# Patient Record
Sex: Female | Born: 1935 | Race: Black or African American | Hispanic: No | Marital: Married | Smoking: Former smoker
Health system: Southern US, Community
[De-identification: ages and names within clinical notes are randomized; demographics above are authoritative.]

## PROBLEM LIST (undated history)

## (undated) ENCOUNTER — Emergency Department (HOSPITAL_COMMUNITY): Disposition: A | Discharge status: Other Acute Inpatient Hospital | Payer: Medicare Other

## (undated) DIAGNOSIS — Z950 Presence of cardiac pacemaker: Secondary | ICD-10-CM

## (undated) DIAGNOSIS — I429 Cardiomyopathy, unspecified: Secondary | ICD-10-CM

## (undated) DIAGNOSIS — I10 Essential (primary) hypertension: Secondary | ICD-10-CM

## (undated) DIAGNOSIS — D509 Iron deficiency anemia, unspecified: Secondary | ICD-10-CM

## (undated) DIAGNOSIS — K469 Unspecified abdominal hernia without obstruction or gangrene: Secondary | ICD-10-CM

## (undated) DIAGNOSIS — F32A Depression, unspecified: Secondary | ICD-10-CM

## (undated) DIAGNOSIS — M1990 Unspecified osteoarthritis, unspecified site: Secondary | ICD-10-CM

## (undated) DIAGNOSIS — F329 Major depressive disorder, single episode, unspecified: Secondary | ICD-10-CM

## (undated) DIAGNOSIS — E049 Nontoxic goiter, unspecified: Secondary | ICD-10-CM

## (undated) DIAGNOSIS — I442 Atrioventricular block, complete: Secondary | ICD-10-CM

## (undated) DIAGNOSIS — J449 Chronic obstructive pulmonary disease, unspecified: Secondary | ICD-10-CM

## (undated) DIAGNOSIS — Z90710 Acquired absence of both cervix and uterus: Secondary | ICD-10-CM

## (undated) DIAGNOSIS — Z8719 Personal history of other diseases of the digestive system: Secondary | ICD-10-CM

## (undated) DIAGNOSIS — I4891 Unspecified atrial fibrillation: Secondary | ICD-10-CM

## (undated) DIAGNOSIS — K5790 Diverticulosis of intestine, part unspecified, without perforation or abscess without bleeding: Secondary | ICD-10-CM

## (undated) DIAGNOSIS — I5020 Unspecified systolic (congestive) heart failure: Secondary | ICD-10-CM

## (undated) DIAGNOSIS — I447 Left bundle-branch block, unspecified: Secondary | ICD-10-CM

## (undated) DIAGNOSIS — K31819 Angiodysplasia of stomach and duodenum without bleeding: Secondary | ICD-10-CM

## (undated) DIAGNOSIS — K219 Gastro-esophageal reflux disease without esophagitis: Secondary | ICD-10-CM

## (undated) DIAGNOSIS — I2510 Atherosclerotic heart disease of native coronary artery without angina pectoris: Secondary | ICD-10-CM

## (undated) HISTORY — DX: Gastro-esophageal reflux disease without esophagitis: K21.9

## (undated) HISTORY — DX: Depression, unspecified: F32.A

## (undated) HISTORY — DX: Major depressive disorder, single episode, unspecified: F32.9

## (undated) HISTORY — PX: CARDIAC CATHETERIZATION: SHX172

## (undated) HISTORY — PX: SMALL INTESTINE SURGERY: SHX150

## (undated) HISTORY — PX: PACEMAKER PLACEMENT: SHX43

## (undated) HISTORY — PX: ABDOMINAL HYSTERECTOMY: SHX81

## (undated) HISTORY — DX: Acquired absence of both cervix and uterus: Z90.710

## (undated) HISTORY — DX: Unspecified abdominal hernia without obstruction or gangrene: K46.9

## (undated) HISTORY — DX: Unspecified osteoarthritis, unspecified site: M19.90

## (undated) HISTORY — PX: INSERT / REPLACE / REMOVE PACEMAKER: SUR710

## (undated) HISTORY — PX: CHOLECYSTECTOMY: SHX55

## (undated) HISTORY — DX: Cardiomyopathy, unspecified: I42.9

## (undated) HISTORY — DX: Essential (primary) hypertension: I10

## (undated) HISTORY — DX: Unspecified atrial fibrillation: I48.91

## (undated) HISTORY — DX: Chronic obstructive pulmonary disease, unspecified: J44.9

## (undated) HISTORY — DX: Atherosclerotic heart disease of native coronary artery without angina pectoris: I25.10

## (undated) HISTORY — DX: Nontoxic goiter, unspecified: E04.9

## (undated) HISTORY — DX: Diverticulosis of intestine, part unspecified, without perforation or abscess without bleeding: K57.90

## (undated) HISTORY — PX: TUBAL LIGATION: SHX77

---

## 2005-03-03 ENCOUNTER — Inpatient Hospital Stay (HOSPITAL_COMMUNITY): Admission: RE | Admit: 2005-03-03 | Discharge: 2005-03-06 | Payer: Self-pay | Admitting: Orthopedic Surgery

## 2005-03-06 ENCOUNTER — Ambulatory Visit: Payer: Self-pay | Admitting: Physical Medicine & Rehabilitation

## 2005-03-06 ENCOUNTER — Inpatient Hospital Stay
Admission: RE | Admit: 2005-03-06 | Discharge: 2005-03-16 | Payer: Self-pay | Admitting: Physical Medicine & Rehabilitation

## 2006-06-02 ENCOUNTER — Encounter: Admission: RE | Admit: 2006-06-02 | Discharge: 2006-06-02 | Payer: Self-pay | Admitting: Orthopedic Surgery

## 2006-06-30 ENCOUNTER — Inpatient Hospital Stay (HOSPITAL_COMMUNITY): Admission: RE | Admit: 2006-06-30 | Discharge: 2006-07-03 | Payer: Self-pay | Admitting: Orthopedic Surgery

## 2009-10-29 ENCOUNTER — Encounter: Payer: Self-pay | Admitting: Internal Medicine

## 2009-10-29 ENCOUNTER — Ambulatory Visit: Payer: Self-pay | Admitting: Cardiology

## 2009-11-11 ENCOUNTER — Encounter: Payer: Self-pay | Admitting: Internal Medicine

## 2010-02-12 ENCOUNTER — Other Ambulatory Visit: Payer: Self-pay | Admitting: Internal Medicine

## 2010-02-12 ENCOUNTER — Encounter: Payer: Self-pay | Admitting: Internal Medicine

## 2010-02-12 ENCOUNTER — Ambulatory Visit
Admission: RE | Admit: 2010-02-12 | Discharge: 2010-02-12 | Payer: Self-pay | Source: Home / Self Care | Attending: Internal Medicine | Admitting: Internal Medicine

## 2010-02-12 DIAGNOSIS — I495 Sick sinus syndrome: Secondary | ICD-10-CM | POA: Insufficient documentation

## 2010-02-12 DIAGNOSIS — Z950 Presence of cardiac pacemaker: Secondary | ICD-10-CM | POA: Insufficient documentation

## 2010-02-12 DIAGNOSIS — I10 Essential (primary) hypertension: Secondary | ICD-10-CM | POA: Insufficient documentation

## 2010-02-12 LAB — CBC WITH DIFFERENTIAL/PLATELET
Basophils Absolute: 0 10*3/uL (ref 0.0–0.1)
Basophils Relative: 0.5 % (ref 0.0–3.0)
Eosinophils Absolute: 0.2 10*3/uL (ref 0.0–0.7)
Eosinophils Relative: 3.2 % (ref 0.0–5.0)
HCT: 38.8 % (ref 36.0–46.0)
Hemoglobin: 13.3 g/dL (ref 12.0–15.0)
Lymphocytes Relative: 25.9 % (ref 12.0–46.0)
Lymphs Abs: 1.8 10*3/uL (ref 0.7–4.0)
MCHC: 34.2 g/dL (ref 30.0–36.0)
MCV: 84.8 fl (ref 78.0–100.0)
Monocytes Absolute: 0.4 10*3/uL (ref 0.1–1.0)
Monocytes Relative: 5.4 % (ref 3.0–12.0)
Neutro Abs: 4.6 10*3/uL (ref 1.4–7.7)
Neutrophils Relative %: 65 % (ref 43.0–77.0)
Platelets: 261 10*3/uL (ref 150.0–400.0)
RBC: 4.57 Mil/uL (ref 3.87–5.11)
RDW: 13.8 % (ref 11.5–14.6)
WBC: 7.1 10*3/uL (ref 4.5–10.5)

## 2010-02-12 LAB — PROTIME-INR
INR: 1.4 ratio — ABNORMAL HIGH (ref 0.8–1.0)
Prothrombin Time: 14.6 s — ABNORMAL HIGH (ref 9.7–11.8)

## 2010-02-12 LAB — BASIC METABOLIC PANEL
BUN: 18 mg/dL (ref 6–23)
CO2: 28 mEq/L (ref 19–32)
Calcium: 9.4 mg/dL (ref 8.4–10.5)
Chloride: 102 mEq/L (ref 96–112)
Creatinine, Ser: 0.8 mg/dL (ref 0.4–1.2)
GFR: 88.45 mL/min (ref >60.00)
Glucose, Bld: 88 mg/dL (ref 70–99)
Potassium: 4.2 mEq/L (ref 3.5–5.1)
Sodium: 138 mEq/L (ref 135–145)

## 2010-02-12 LAB — APTT: aPTT: 27.2 s (ref 21.7–28.8)

## 2010-02-17 ENCOUNTER — Ambulatory Visit (HOSPITAL_COMMUNITY)
Admission: RE | Admit: 2010-02-17 | Discharge: 2010-02-17 | Payer: Self-pay | Source: Home / Self Care | Attending: Internal Medicine | Admitting: Internal Medicine

## 2010-02-18 ENCOUNTER — Encounter: Payer: Self-pay | Admitting: Internal Medicine

## 2010-02-24 LAB — SURGICAL PCR SCREEN
MRSA, PCR: NEGATIVE
Staphylococcus aureus: NEGATIVE

## 2010-03-06 ENCOUNTER — Ambulatory Visit: Admission: RE | Admit: 2010-03-06 | Discharge: 2010-03-06 | Payer: Self-pay | Source: Home / Self Care

## 2010-03-06 ENCOUNTER — Encounter: Payer: Self-pay | Admitting: Internal Medicine

## 2010-03-10 ENCOUNTER — Telehealth (INDEPENDENT_AMBULATORY_CARE_PROVIDER_SITE_OTHER): Payer: Self-pay | Admitting: *Deleted

## 2010-03-11 NOTE — Miscellaneous (Signed)
Summary: Device preload  Clinical Lists Changes  Observations: Added new observation of PPM INDICATN: Sick sinus syndrome (11/11/2009 15:30) Added new observation of MAGNET RTE: BOL 98.6 ERI 86.3 (11/11/2009 15:30) Added new observation of PPMLEADSTAT2: active (11/11/2009 15:30) Added new observation of PPMLEADSER2: [id number] (11/11/2009 15:30) Added new observation of PPMLEADMOD2: 1488TC (11/11/2009 15:30) Added new observation of PPMLEADDOI2: 11/01/2000 (11/11/2009 15:30) Added new observation of PPMLEADLOC2: RV (11/11/2009 15:30) Added new observation of PPMLEADSTAT1: active (11/11/2009 15:30) Added new observation of PPMLEADSER1: [id number] (11/11/2009 15:30) Added new observation of PPMLEADMOD1: 1488TC (11/11/2009 15:30) Added new observation of PPMLEADDOI1: 11/01/2000 (11/11/2009 15:30) Added new observation of PPMLEADLOC1: RA (11/11/2009 15:30) Added new observation of PPM IMP MD: NOT IMPLANTED BY [place] (11/11/2009 15:30) Added new observation of PPM DOI: 11/01/2000 (11/11/2009 15:30) Added new observation of PPM SERL#: [id number]  (11/11/2009 15:30) Added new observation of PPM MODL#: [id number]  (11/11/2009 15:30) Added new observation of PACEMAKERMFG: St Jude  (11/11/2009 15:30) Added new observation of PPM REFER MD: Duffy Rhody Tennant,MD  (11/11/2009 15:30) Added new observation of PACEMAKER MD: Hillis Range, MD  (11/11/2009 15:30)      PPM Specifications Following MD:  Hillis Range, MD     Referring MD:  Rolla Plate PPM Vendor:  St Jude     PPM Model Number:  [id number]     PPM Serial Number:  [id number] PPM DOI:  11/01/2000     PPM Implanting MD:  NOT IMPLANTED BY [place]  Lead 1    Location: RA     DOI: 11/01/2000     Model #: 1488TC     Serial #: [id number]     Status: active Lead 2    Location: RV     DOI: 11/01/2000     Model #: 1488TC     Serial #: [id number]     Status: active  Magnet Response Rate:  BOL 98.6 ERI 86.3  Indications:  Sick sinus syndrome

## 2010-03-13 NOTE — Letter (Signed)
Summary: [facility] Cardiology Progress Note   [facility] Cardiology Progress Note   Imported By: Roderic Ovens 02/14/2010 12:28:34  _____________________________________________________________________  External Attachment:    Type:   Image     Comment:   External Document

## 2010-03-13 NOTE — Miscellaneous (Signed)
Summary: Device change out  Clinical Lists Changes  Observations: Added new observation of MAGNET RTE: BOL100 ERI 85 (02/18/2010 13:53) Added new observation of PPM IMP MD: Lewayne Bunting, MD (02/18/2010 13:53) Added new observation of PPM DOI: 02/17/2010 (02/18/2010 13:53) Added new observation of PPM SERL#: [id number]  (02/18/2010 13:53) Added new observation of PPM MODL#: [id number]  (02/18/2010 11:91) Added new observation of PPMEXPLCOMM: 02/17/10 St.Jude [id number] explanted  (02/18/2010 13:53)      PPM Specifications Following MD:  Hillis Range, MD     Referring MD:  Rolla Plate PPM Vendor:  [facility] Jude     PPM Model Number:  [id number]     PPM Serial Number:  [id number] PPM DOI:  02/17/2010     PPM Implanting MD:  Lewayne Bunting, MD  Lead 1    Location: RA     DOI: 11/01/2000     Model #: 1488TC     Serial #: [id number]     Status: active Lead 2    Location: RV     DOI: 11/01/2000     Model #: 1488TC     Serial #: [id number]     Status: active  Magnet Response Rate:  BOL100 ERI 85  Indications:  Sick sinus syndrome  Explantation Comments:  02/17/10 St.Jude [id number] explanted  PPM Follow Up Pacer Dependent:  Yes      Parameters Mode:  DDDR     Lower Rate Limit:  60     Upper Rate Limit:  120 Paced AV Delay:  150     Sensed AV Delay:  130

## 2010-03-13 NOTE — Procedures (Signed)
Summary: pacer check/st. jude   Allergies (verified): No Known Drug Allergies  History of Present Illness: [name] returns today for PPM follow up.  She has a h/o bradycardia and apparent CHB.  She underwent P[place] in [place] in 2002.  She has done well and denies c/p or sob.    Past History:  Past Medical History: Hypertension  Past Surgical History: s/p PPM   Physical Exam  General:  Well developed, well nourished, in no acute distress.  HEENT: normal Neck: supple. No JVD. Carotids 2+ bilaterally no bruits Cor: RRR no rubs, gallops or murmur Lungs: CTA. Well healed PPM incision. Ab: soft, nontender. nondistended. No HSM. Good bowel sounds Ext: warm. no cyanosis, clubbing or edema Neuro: alert and oriented. Grossly nonfocal. affect pleasant     PPM Specifications Following MD:  Hillis Range, MD     Referring MD:  Rolla Plate PPM Vendor:  [facility] Jude     PPM Model Number:  [id number]     PPM Serial Number:  [id number] PPM DOI:  11/01/2000     PPM Implanting MD:  NOT IMPLANTED BY [place]  Lead 1    Location: RA     DOI: 11/01/2000     Model #: 1488TC     Serial #: [id number]     Status: active Lead 2    Location: RV     DOI: 11/01/2000     Model #: 1488TC     Serial #: [id number]     Status: active  Magnet Response Rate:  BOL 98.6 ERI 86.3  Indications:  Sick sinus syndrome   PPM Follow Up Remote Check?  No Battery Est. Longevity:  7-12 months/tech support     Pacer Dependent:  Yes      Parameters Mode:  DDDR     Lower Rate Limit:  60     Upper Rate Limit:  120 Paced AV Delay:  150     Sensed AV Delay:  130 Tech Comments:  Unable to communicate with the device today. I was able to email [facility] @ [address]. Jude tech support with a download of the device.  EKG shows AV pacing with the magnet @ 93bpm.   Estimated longevity 7-12 months per tech support but still unable to get any telemetry.  After talking with Dr. Ladona Ridgel the decision was made to change out the device and will be  scheduled. Altha Harm, LPN  February 12, 2010 12:24 PM  MD Comments:  Agree with above.     Impression & Recommendations:  Problem # 1:  CARDIAC PACEMAKER IN SITU (ICD-V45.01) By paced rate, she should have 7 months of battery but her current device cannot be communicated with.  I have recommended proceeding with PPM. The risks/benefits/goals/expectations of PPM gen change have been discussed with the patient and she wishes to proceed.  Problem # 2:  ESSENTIAL HYPERTENSION, BENIGN (ICD-401.1) She will continue her current meds and maintain a low sodium diet. Her updated medication list for this problem includes:    Carvedilol 25 Mg Tabs (Carvedilol) .[name]... Take one tablet by mouth twice a day    Exforge Hct 10-320-25 Mg Tabs (Amlodipine-valsartan-hctz) ..... Once daily    Aspirin Ec 325 Mg Tbec (Aspirin) .[name]... Take one tablet by mouth daily  Other Orders: TLB-BMP (Basic Metabolic Panel-BMET) (80048-METABOL) TLB-CBC Platelet - w/Differential (85025-CBCD) TLB-PT (Protime) (85610-PTP) TLB-PTT (85730-PTTL)

## 2010-03-13 NOTE — Cardiovascular Report (Signed)
Summary: Office Visit   Office Visit   Imported By: Roderic Ovens 02/14/2010 12:27:47  _____________________________________________________________________  External Attachment:    Type:   Image     Comment:   External Document

## 2010-03-13 NOTE — Cardiovascular Report (Signed)
Summary: Pre-Op Orders  Pre-Op Orders   Imported By: Marylou Mccoy 02/19/2010 16:42:28  _____________________________________________________________________  External Attachment:    Type:   Image     Comment:   External Document

## 2010-03-13 NOTE — Procedures (Signed)
Summary: wch/lg   Current Medications (verified): 1)  Vitamin D-3 5000 Unit Tabs (Cholecalciferol) .... Uad 2)  Tramadol Hcl 50 Mg Tabs (Tramadol Hcl) .... Uad 3)  Carvedilol 25 Mg Tabs (Carvedilol) .... Take One Tablet By Mouth Twice A Day 4)  Exforge Hct 10-320-25 Mg Tabs (Amlodipine-Valsartan-Hctz) .... Once Daily 5)  Aspirin Ec 325 Mg Tbec (Aspirin) .... Take One Tablet By Mouth Daily 6)  Trazodone Hcl 50 Mg Tabs (Trazodone Hcl) .... Take 2 Tablets By Mouth Four Times Daily  Allergies (verified): No Known Drug Allergies  PPM Specifications Following MD:  Hillis Range, MD     Referring MD:  Rolla Plate PPM Vendor:  St Jude     PPM Model Number:  [id number]     PPM Serial Number:  [id number] PPM DOI:  02/17/2010     PPM Implanting MD:  Lewayne Bunting, MD  Lead 1    Location: RA     DOI: 11/01/2000     Model #: 1488TC     Serial #: [id number]     Status: active Lead 2    Location: RV     DOI: 11/01/2000     Model #: 1488TC     Serial #: [id number]     Status: active  Magnet Response Rate:  BOL100 ERI 85  Indications:  Sick sinus syndrome  Explantation Comments:  02/17/10 St.Jude [id number] explanted  PPM Follow Up Battery Voltage:  3.20 V     Battery Est. Longevity:  9.0 yrs     Pacer Dependent:  Yes       PPM Device Measurements Atrium  Amplitude: 1.7 mV, Impedance: 430 ohms, Threshold: 0.5 V at 0.5 msec Right Ventricle  Amplitude: 9.2 mV, Impedance: 480 ohms, Threshold: 1.5 V at 0.5 msec  Episodes MS Episodes:  19826     Percent Mode Switch:  65%     Ventricular High Rate:  0     Atrial Pacing:  33%     Ventricular Pacing:  >99%  Parameters Mode:  DDDR     Lower Rate Limit:  60     Upper Rate Limit:  120 Paced AV Delay:  150     Sensed AV Delay:  130 Next Cardiology Appt Due:  05/12/2010 Tech Comments:  WOUND CHECK---STERI STRIPS REMOVED.  NO REDNESS OR SWELLING AT SITE.  NORMAL DEVICE FUNCTION.  CHANGED RA SENSITIVITY FROM 0.2 TO 0.73mV AND RV SENSITIVITY FROM 4.0 TO 3.29mV.   CHANGED RA OUTPUT FROM 2.5 TO 2.0 V. ROV IN 3 MTHS W/GT. Vella Kohler  March 06, 2010 11:44 AM

## 2010-03-19 NOTE — Progress Notes (Signed)
Summary: QUESTION RE DEVICE  Phone Note Call from Patient Call back at Home Phone [phone]   Caller: Patient Reason for Call: Talk to Nurse Summary of Call: PT HAS QUESTION RE HER PACEMAKER.  Initial call taken by: Roe Coombs,  March 10, 2010 1:19 PM  Follow-up for Phone Call        Patient told to bring [facility] transmitter with her to next visit for instructions. Follow-up by: Altha Harm, LPN,  March 11, 2010 4:53 PM

## 2010-03-19 NOTE — Cardiovascular Report (Signed)
Summary: Office Visit   Office Visit   Imported By: Roderic Ovens 03/12/2010 15:25:24  _____________________________________________________________________  External Attachment:    Type:   Image     Comment:   External Document

## 2010-03-20 NOTE — Op Note (Signed)
  NAMEZANNA, HAWN                ACCOUNT NO.:  [id number]  MEDICAL RECORD NO.:  [id number]          PATIENT TYPE:  OIB  LOCATION:  2899                         FACILITY:  [facility]  PHYSICIAN:  Doylene Canning. Ladona Ridgel, MD    DATE OF BIRTH:  22-Nov-1932  DATE OF PROCEDURE:  02/17/2010 DATE OF DISCHARGE:                              OPERATIVE REPORT   PROCEDURE PERFORMED:  Removal of a previously implanted dual-chamber pacemaker which reached beyond ERI, insertion of a new dual-chamber pacemaker with pacemaker lead testing and pacemaker pocket revision.  INTRODUCTION:  The patient is a 77 year old woman with a history of symptomatic bradycardia and complete heart block.  She is status post permanent pacemaker insertion in 2003.  She is at end-of-life.  Her interrogator will not communicate with her current pacemaker and she is now referred for removal of a previously implanted device and insertion of a new.  PROCEDURE:  After informed consent was obtained, the patient was taken to the diagnostic EP lab in a fasting state.  After usual preparation and draping, intravenous fentanyl and midazolam was given for sedation, 30 mL of lidocaine was infiltrated into the left infraclavicular region. A 5-cm incision was carried out over this region.  Electrocautery was utilized to dissect down to the pacemaker pocket.  The pacemaker pocket was entered and the device was removed with gentle traction.  The leads were freed up with electrocautery.  The P-waves were run a millivolt with underlying rhythm in the AFib with the impedance in the AOO mode was 420 ohms.  Interrogation of the ventricular lead demonstrated an escape rate of about 30-35 beats per minute.  R-waves measured 9 mV, pacing impedance was 440 ohms and threshold was around a volt 0.25 at 0.5 milliseconds.  With these satisfactory parameters, the new St. Jude Accent RF DR dual-chamber pacemaker serial number [id number] was connected to the  atrial and the RV leads and placed back in the subcutaneous pocket.  The pocket was insufficient size for the new device and electrocautery was utilized to revise the pocket to accommodate the slightly larger device.  At this point the pocket was irrigated and the incision closed with 2-0 and 3-0 Vicryl.  Benzoin and Steri-Strips were painted on the skin.  A pressure dressing was applied and the patient was returned to her room in satisfactory condition.  COMPLICATIONS:  There were no immediate procedure complications.  RESULTS:  This demonstrated successful removal of a previously implanted dual-chamber pacemaker which was at EOL, followed by revision of a permanent pacemaker pocket, followed by insertion of a new dual-chamber St. Jude Accent pacemaker without immediate procedure complications.     Doylene Canning. Ladona Ridgel, MD     GWT/MEDQ  D:  02/17/2010  T:  02/18/2010  Job:  [id number]  Electronically Signed by Lewayne Bunting MD on 03/20/2010 05:09:52 PM

## 2010-05-30 ENCOUNTER — Encounter: Payer: Self-pay | Admitting: *Deleted

## 2010-05-30 ENCOUNTER — Encounter: Payer: Self-pay | Admitting: Internal Medicine

## 2010-06-02 ENCOUNTER — Ambulatory Visit (INDEPENDENT_AMBULATORY_CARE_PROVIDER_SITE_OTHER): Payer: Medicare Other | Admitting: Internal Medicine

## 2010-06-02 ENCOUNTER — Encounter: Payer: Self-pay | Admitting: Internal Medicine

## 2010-06-02 ENCOUNTER — Other Ambulatory Visit: Payer: Self-pay | Admitting: Internal Medicine

## 2010-06-02 DIAGNOSIS — I10 Essential (primary) hypertension: Secondary | ICD-10-CM

## 2010-06-02 DIAGNOSIS — Z950 Presence of cardiac pacemaker: Secondary | ICD-10-CM

## 2010-06-02 DIAGNOSIS — I495 Sick sinus syndrome: Secondary | ICD-10-CM

## 2010-06-02 NOTE — Assessment & Plan Note (Signed)
Her blood pressure appears to be well controlled. She will continue a low-sodium diet and her current medications.

## 2010-06-02 NOTE — Patient Instructions (Signed)
Your physician wants you to follow-up in: 9 months with Dr Taylor.  You will receive a reminder letter in the mail two months in advance. If you don't receive a letter, please call our office to schedule the follow-up appointment.  

## 2010-06-02 NOTE — Assessment & Plan Note (Signed)
Her device is working normally. Her wound appears to be well-healed. We'll see her back in several months.

## 2010-06-02 NOTE — Progress Notes (Signed)
HPI [name] returns today for followup. She has a history of hypertension and bradycardia and status post pacemaker insertion. She recently underwent pacemaker generator change. She has healed up nicely and has no specific complaints today. No syncope, peripheral edema, chest pain, or shortness of breath. No Known Allergies   Current Outpatient Prescriptions  Medication Sig Dispense Refill  . ergocalciferol (VITAMIN D2) 50000 UNITS capsule Take 50,000 Units by mouth once a week.        . lansoprazole (PREVACID) 15 MG capsule Take 15 mg by mouth daily.        [name] amLODipine-valsartan (EXFORGE) 10-320 MG per tablet Take 1 tablet by mouth daily.        [name] aspirin 325 MG tablet Take 325 mg by mouth daily.        . carvedilol (COREG) 25 MG tablet Take 25 mg by mouth 2 (two) times daily with a meal.        . traMADol (ULTRAM) 50 MG tablet Take 50 mg by mouth every 6 (six) hours as needed.           Past Medical History  Diagnosis Date  . HTN (hypertension)   . CAD (coronary artery disease)   . Hernia     Incisional hernia, umbilicus.    ROS:   All systems reviewed and negative except as noted in the HPI.   Past Surgical History  Procedure Date  . Pacemaker placement , 2002.  [name] Cholecystectomy      Family History  Problem Relation Age of Onset  . Coronary artery disease       History   Social History  . Marital Status: Married    Spouse Name: N/A    Number of Children: N/A  . Years of Education: N/A   Occupational History  . Not on file.   Social History Main Topics  . Smoking status: Current Everyday Smoker    Types: Cigarettes  . Smokeless tobacco: Not on file  . Alcohol Use: Not on file  . Drug Use: Not on file  . Sexually Active: Not on file   Other Topics Concern  . Not on file   Social History Narrative    Married, lives with her husband in a one-level home, three steps to enter, moved from [place] three years ago due to her husband's retirement and  his local roots being from [place].     BP 110/70  Pulse 68  Ht 5\' 9"  (1.753 m)  Wt 240 lb (108.863 kg)  BMI 35.44 kg/m2  Physical Exam:  Well appearing NAD HEENT: Unremarkable Neck:  No JVD, no thyromegally Lymphatics:  No adenopathy Back:  No CVA tenderness Lungs:  Clear.well-healed pacemaker incision. HEART:  Regular rate rhythm, no murmurs, no rubs, no clicks Abd:  Flat, positive bowel sounds, no organomegally, no rebound, no guarding Ext:  2 plus pulses, no edema, no cyanosis, no clubbing Skin:  No rashes no nodules Neuro:  CN II through XII intact, motor grossly intact  DEVICE  Normal device function.  See PaceArt for details.   Assess/Plan:

## 2010-06-24 NOTE — Op Note (Signed)
[name]                ACCOUNT NO.:  [id number]   MEDICAL RECORD NO.:  [id number]          PATIENT TYPE:  INP   LOCATION:  5018                         FACILITY:  [facility]   PHYSICIAN:  Burnard Bunting, M.D.    DATE OF BIRTH:  11-21-1932   DATE OF PROCEDURE:  06/30/2006  DATE OF DISCHARGE:                               OPERATIVE REPORT   PREOPERATIVE DIAGNOSIS:  Right knee arthritis.   POSTOPERATIVE DIAGNOSIS:  Right knee arthritis.   PROCEDURE:  Right total knee replacement.   SURGEON:  Burnard Bunting, M.D.  Assistant  Vear Clock M.D.   ANESTHESIA:  General endotracheal.   ESTIMATED BLOOD LOSS:  150 mL.   DRAINS:  Hemovac x1.   INDICATIONS:  [name] is a 77 year old female with end-stage  arthritis in her right knee who presents now for total knee replacement  after an explanation of the risks and benefits.   COMPONENTS UTILIZED:  DePuy rotating platform size 5 femur, 4 tibia, 10  poly, 35 patella.  Posterior cruciate -sacrificing.   PROCEDURE IN DETAIL:  The patient was brought to the operating room  where general endotracheal anesthesia was induced, proper antibiotics  were administered.  Right leg, knee and foot were prepped and draped  with DuraPrep solution and draped in a sterile manner.  [name] was used  to cover the operative field.  The leg was elevated, exsanguinated with  the Esmarch.  Tourniquet was inflated.  Anterior peritoneum was  utilized.  Skin and subcutaneous tissue were sharply divided.  A medial  parapatellar approach to the knee was made.  The precise location was  marked with a 0 Vicryl suture.  Patellar ligament was loose.  Fat pad  was partially excised.  Minimal soft tissue dissection was performed  medially because of the patient's preop valgus alignment.  The  suprapatellar pouch was incised and tagged for later closure.  Two pins  were then placed in the distal medial femur and proximal medial tibia.  Bipatellar contact was  achieved.  The registration points were obtained  beginning with the hips in rotation, the bimalleolar axis and the right  as well as various points about the knee.  The ACL and PCL were  released.  A tibial cut was made perpendicular to the _mechanical  axis______ according to preoperative pin plating.  Attention was then  placed in extension and flexion in order to balance the gaps.   The distal femoral resection was made.  Chamfer cuts were made.  Box cut  was made.  Spacer block was placed.  The patient was found to have full  extension, have good stability as well as adequate flexion gap.  At this  time tibia was keyhole punched.  Trial components were placed.  Patient  had full extension, full flexion, excellent patellar tracking with no  thumbs technique and good stability at 0, 30 and 90 degrees of flexion.  Trial components were removed.  Patella was cut in free hand fashion for  a 35 patella, 10 spacer gave excellent stability in full extension.   Following irrigation  of the cut bony surfaces, components were cemented  into position.  Excess cement was removed.  Cement was allowed to  harden.  Tourniquet was released.  Bleeding points encountered were  controlled using electrocautery.  Thorough pulsatile irrigation was  performed.  The patient maintained the same stability parameters with  true components as with trial components.  Hemovac drain was placed.  Incision was closed over a bolster with reapproximation of the  suprapatellar pouch followed by reapproximation of the medial  parapatellar arthrotomy using #1 Vicryl followed by interrupted inverted  0 Vicryl, 2-0 Vicryl and skin staples.  A tincture of Marcaine, morphine  and clonidine was injected into the knee.  A bulky dressing was placed.  The patient tolerated the procedure well without immediate  complications.  She was placed in a bulky dressing knee immobilizer.  It  should be noted that Dr. Lenny Pastel assistance  was required during the  case.  It was a medical necessity because of retraction of neurovascular  structures.      Burnard Bunting, M.D.  Electronically Signed     GSD/MEDQ  D:  06/30/2006  T:  07/01/2006  Job:  [id number]

## 2010-06-27 NOTE — Discharge Summary (Signed)
[name], [name]                ACCOUNT NO.:  [id number]   MEDICAL RECORD NO.:  [id number]          PATIENT TYPE:  INP   LOCATION:  5018                         FACILITY:  [facility]   PHYSICIAN:  Burnard Bunting, M.D.    DATE OF BIRTH:  Mar 01, 1932   DATE OF ADMISSION:  06/30/2006  DATE OF DISCHARGE:  07/03/2006                               DISCHARGE SUMMARY   DISCHARGE DIAGNOSIS:  Right knee arthritis.   SECONDARY DIAGNOSES:  1. History of pacemaker placement.  2. History of hysterectomy.  3. History of left total knee replacement, March 03, 2005.   OPERATION/PROCEDURE:  Right total knee replacement, performed Jun 30, 2006.   HOSPITAL COURSE:  Camisha Schwimmer is a 77 year old patient with end-stage  right knee arthritis.  She underwent a right total knee replacement, Jun 30, 2006.  She tolerated the procedure well without immediate  complications.  Postoperatively, the patient noted to have profuse  mobile and sensate push.  She was started on Coumadin, DVT prophylaxis,  therapy for mobilization and PPM machine for knee motion.  Hemoglobin  10.5 on postop day number 1.  Incision was intact.  On postop day number  3, creatinine remained stable throughout hospitalization.  Incision was  intact at that time.  The patient was discharged home on postop day  number 3 in good condition.  She will continue with home health PT for  knee range of motion, strengthening and mobilization.   DISCHARGE MEDICATIONS:  Include:  1. Diazide 25 mg 1 p.o. daily.  2. Avapro 300 mg 1 p.o. daily.  3. Prevacid 30 mg p.o. daily.  4. Protonix 40 mg p.o. daily.  5. Coreg 0.25 mg 1 p.o. b.i.d.  6. Norvasc 10 mg 1 p.o. daily.  7. Percocet 1-2 p.o. every 3-4 hours p.r.n. pain.  8. Coumadin 5 mg p.o. daily with INR at 2.0-2.5.   She will follow up with me in 7-10 days for stable removal.      Burnard Bunting, M.D.  Electronically Signed     GSD/MEDQ  D:  08/18/2006  T:  08/18/2006  Job:  [id number]

## 2010-06-27 NOTE — Discharge Summary (Signed)
NAMECAYLA, Hancock                ACCOUNT NO.:  [id number]   MEDICAL RECORD NO.:  [id number]          PATIENT TYPE:  ORB   LOCATION:  4505                         FACILITY:  [facility]   PHYSICIAN:  Burnard Bunting, M.D.    DATE OF BIRTH:  12-21-1932   DATE OF ADMISSION:  03/06/2005  DATE OF DISCHARGE:  03/16/2005                                 DISCHARGE SUMMARY   DISCHARGE DIAGNOSES:  1.  Left knee osteoarthritis requiring left total knee replacement.  2.  Acute blood loss anemia.  3.  Hypertension.  4.  Diarrhea, resolved.   HISTORY OF PRESENT ILLNESS:  [name] is a 77 year old female with  history of hypertension, coronary artery disease, left knee pain secondary  to end-stage osteoarthritis.  The patient elected to undergo left total knee  replacement March 03, 2005 by Dr. August Saucer.  Postoperative weight-bearing as  tolerated, on Coumadin for deep venous thrombosis prophylaxis.   Past surgery the patient was noted to have a impaired mobility in activities  of daily living.  Pain control has also been an  issue with some complaints  of dizziness.  Recent check of hemoglobin today is at 8.4.  The patient has  also had some issues with shortness of breath, dyspnea on exertion with  activity.  Subacute was consulted for further therapies.   PAST MEDICAL HISTORY:  Significant for hypertension, coronary artery  disease, permanent pacemaker in 2002, cholecystectomy, hysterectomy and  incisional hernia repair.   ALLERGIES:  No known drug allergies.   FAMILY HISTORY:  Positive for coronary artery disease and stomach cancer.   SOCIAL HISTORY:  The patient is married, lives with husband in one level  home with three steps at entry.  Uses alcohol occasionally and smokes 4  cigarettes a day.   HOSPITAL COURSE:  [name] was admitted to rehabilitation on March 06, 2005 for inpatient therapies to consist of physical therapy and  occupational therapy daily.  Past admission  Coumadin was continued for deep  venous thrombosis prophylaxis.  Blood pressure was on low side therefore  Maxzide and Norvasc were held.  The patient's acute blood loss anemia has  been followed along.  The patient was not in favor of getting a transfusion.  Last check of CBC on March 13, 2005 revealed hemoglobin of 8.6 and  hematocrit of 25.0. Check of lytes revealed initially mild hypokalemia.  This was supplemented and rechecked on March 11, 2005 and revealed sodium  136, potassium 4.1, chloride 102, cO2 28, BUN 12, creatinine 0.8, glucose  115.  The patient's pain control was managed with use of addition of  OxyContin 20 mg q.12h. with scheduling Ultram and PRN oxycodone use.  The  patient's knee incision has been monitored along and this has healed well  without any signs or symptoms of infection.   The patient during this stay has had problems with some diarrhea and nausea  and vomiting on February 3 and February 4, disease questioned secondary to  constipation.  The patient has received some extra Laxative that could have  contributed to diarrhea,  however, there was a question of virus on unit.  Diarrhea, nausea and vomiting had resolved as of March 16, 2005 A.M. and  the patient is able to tolerate breakfast without difficulty.  No complaints  of gastrointestinal symptoms.  The patient instructed to come to the  emergency department if diarrhea recurs.  In terms of mobility the patient  has progressed along well.  By the time of discharge the patient was at  minimal guard supervision level for transfers, minimal guard supervision  with que's for safety for ambulating short distances.  She was supervision  for activities of daily living, supervision for toileting.  Further follow  up therapies to include home health, physical therapy, occupational therapy  by [facility] with home health R. N. for [facility] draws.  On  March 16, 2005 the patient is discharged to  home.   DISCHARGE MEDICATIONS:  1.  Avapro 300 mg daily.  2.  Norvasc 10 mg daily.  3.  Coreg 25 mg b.i.d.  4.  Celebrex 200 mg a day.  5.  Ultram 50 mg q.i.d.  6.  Coumadin 2 mg half p.o. q.p.m. starting March 17, 2005.  7.  Trinsicon p.o. b.i.d.  8.  Prevacid 20 mg daily.  9.  Oxy-IR 5 to 10 mg q.46h. PRN pain.  10. OxyContin CR 20 mg b.i.d. X1 week then one per day until gone.  11. Phenergan 12.5 mg q.4-6h. PRN nausea.   ACTIVITY:  As tolerated, supervision.   DIET:  Regular.   WOUND CARE:  Wash area with antibacterial soap and water, keep clean and  dry.   SPECIAL DISCHARGE INSTRUCTIONS:  Do not use Maxzide for now.  Home Health by  [facility].  Phenergan 12.5 mg q.4-6h. PRN nausea.   FOLLOW UP:  Patient is to follow up with Dr. August Saucer for postoperative check.  Follow up with Dr. Alberteen Sam for blood pressure check. Follow up with Dr.  Faith Rogue as needed.      Greg Cutter, P.A.    ______________________________  Reece Agar. Dorene Grebe, M.D.    PP/MEDQ  D:  03/17/2005  T:  03/17/2005  Job:  [id number]   cc:   [name], MD  Fax: [phone]

## 2010-06-27 NOTE — Op Note (Signed)
NAMESOFIYA, EZELLE                ACCOUNT NO.:  [id number]   MEDICAL RECORD NO.:  [id number]          PATIENT TYPE:  INP   LOCATION:  2899                         FACILITY:  [facility]   PHYSICIAN:  Burnard Bunting, M.D.    DATE OF BIRTH:  1932-08-29   DATE OF PROCEDURE:  03/03/2005  DATE OF DISCHARGE:                                 OPERATIVE REPORT   PREOPERATIVE DIAGNOSIS:  Left knee arthritis.   POSTOPERATIVE DIAGNOSIS:  Left knee arthritis.   OPERATION PERFORMED:  Left total knee replacement.   SURGEON:  Burnard Bunting, M.D.   ASSISTANT:  Jerolyn Shin. Tresa Res, M.D.   ANESTHESIA:  General endotracheal.   ESTIMATED BLOOD LOSS:  150 mL.   DRAINS:  None.   COMPLICATIONS:  DePuy rotating platform, size 5 femur, 4 tibia, 12.5 poly  spacer, 35 patella.   TOURNIQUET TIME:  2 hours, 2 minutes at 300 mmHg.   DESCRIPTION OF PROCEDURE:  The patient was brought to the operating room  where general endotracheal anesthesia was induced.  Preoperative antibiotics  administered.  Left leg and foot was prepped with DuraPrep solution and  draped in sterile manner.  Operative field was covered with Ioban.  The leg  was elevated, exsanguinated with Esmarch wrap.  Tourniquet was inflated in  the midline.  Anterior incision to the knee was utilized.  Skin and  subcutaneous tissue sharply divided.  Median parapatellar arthrotomy was  made.  The precise location of the arthrotomy was then marked with a #1  Vicryl suture.  Lateral patellofemoral ligament was released, fat pad was  partially excised, osteophytes were removed, soft tissue from the anterior  distal aspect of the femur was resected.  ACL and PCL were released.  Minimal soft tissue dissection was performed on the medial side because of  the patient's preoperative slightly valgus alignment. The two guide pins  were then placed in the anterior distal medial femoral, the posterior  lateral distal femur using separate puncture incisions.   Bicortical contact  was achieved and in two similar fashion, two tibial pins were placed.  The  data point registration was achieved beginning with the center hip rotation  as well as the medial and lateral malleoli and various points around the  knee.  Tibial cut was then performed.  Tension balancing was then performed  in both flexion and extension.  Femur was then cut according to preoperative  and intraoperative computer generated template.  Chamfer cuts were then  performed along with cross cut.  Symmetric extension gaps were noted in both  full extension and 90 degrees of flexion.  The patient did have about 10 to  12 degrees of hyperextension on the right knee and about 3 to 4 degrees of  hyperextension on the operative knee.  The tibia was then keel punched.  Patella was measured at 25 mm and 12 mm resection was performed.  The trial  components were then placed with the patient achieving 3 to 4 degrees of  extension, full flexion, excellent patellar tracking, good collateral  ligament stability in 0, 30 and 90  degrees of flexion.  No spinout was  noted.  The trial components were removed.  Copious pulsatile irrigation was  performed.  The components were cemented into position beginning with tibia,  femur, and patella with 12.5 mm spacer.  Cement was allowed to harden.  Excess cement was removed.  Tourniquet was released.  Bleeding points  encountered controlled using bipolar electrocautery.  Patella continued to  have good traction with no thumbs technique.  Incision was again irrigated  and closed over Hemovac drain using interrupted #1 Vicryl suture to  reapproximate the arthrotomy followed by interrupted inverted 0 Vicryl, 2-0  Vicryl  and skin staples.  The puncture holes for the pins were cleaned with  irrigating solution and closed using 3-0 nylon suture.  A bulky well padded  dressing was applied.  A solution of Marcaine, morphine and Clonidine was  then injected into the  knee for postoperative pain relief.  The patient  tolerated the procedure well without immediate complications.  The patient  was then transferred to the recovery room in stable condition.           ______________________________  G. Dorene Grebe, M.D.     GSD/MEDQ  D:  03/03/2005  T:  03/04/2005  Job:  [id number]

## 2010-06-27 NOTE — Discharge Summary (Signed)
[name], [name]                ACCOUNT NO.:  [id number]   MEDICAL RECORD NO.:  [id number]          PATIENT TYPE:  INP   LOCATION:  5007                         FACILITY:  [facility]   PHYSICIAN:  Burnard Bunting, M.D.    DATE OF BIRTH:  04/15/1932   DATE OF ADMISSION:  03/03/2005  DATE OF DISCHARGE:  03/06/2005                                 DISCHARGE SUMMARY   DISCHARGE DIAGNOSIS:  Left knee arthritis.   SECONDARY DIAGNOSES:  1.  History of pacemaker placement.  2.  Gastroesophageal reflux disease.  3.  Osteoarthritis.  4.  Hypertension.  5.  Heart disease.   OPERATIONS AND PROCEDURES:  Left total knee replacement performed March 03, 2005.   HOSPITAL COURSE:  [name] is a 77 year old patient with end-stage left  knee arthritis.  She underwent left total knee replacement on March 03, 2005, without complications.  She tolerated the procedure well.  Left foot  was mobile, perfuse and sensate on postoperative day #1.  At that time,  hematocrit was 30.  She was started on Coumadin for DVT prophylaxis as well  as CPM machine and physical therapy for knee motion and mobilization.  Incision was intact on postoperative day #3.  Patient had an otherwise  unremarkable recovery.  She was ambulating in the hall with a walker at the  time of discharge.  She was discharged in good condition to [facility] on March 04, 2005.   DISCHARGE MEDICATIONS:  1.  Coreg 25 b.i.d.  2.  Triamterene 25 mg daily.  3.  Norvasc 10 mg p.o. daily.  4.  Prevacid daily.  5.  Tramadol 50 q.i.d.  6.  Avapro.  7.  Aspirin.  8.  Coumadin for DVT prophylaxis.  9.  Percocet for pain.  10. Robaxin as a muscle relaxer.   I will see her back in seven days for suture removal.  Continue with  activity and range of motion as tolerated.           ______________________________  G. Dorene Grebe, M.D.    GSD/MEDQ  D:  04/28/2005  T:  04/29/2005  Job:  [id number]

## 2010-06-27 NOTE — H&P (Signed)
NAMEGRISELA, [name]                ACCOUNT NO.:  [id number]   MEDICAL RECORD NO.:  [id number]          PATIENT TYPE:  ORB   LOCATION:  4504                         FACILITY:  [facility]   PHYSICIAN:  Erick Colace, M.D.DATE OF BIRTH:  1932-12-10   DATE OF ADMISSION:  03/06/2005  DATE OF DISCHARGE:                                HISTORY & PHYSICAL   __________   REASON FOR ADMISSION:  Decline in self care and mobility skills, left knee  OA status post TKR.   HISTORY:  A 77 year old female with a history of hypertension, coronary  artery disease, left knee pain secondary to end stage OA elected to undergo  a left TKR on March 03, 2005, per Dr. August Saucer.  Postoperatively made  weightbearing as tolerated on Coumadin for DVT prophylaxis.  Postoperative  acute blood loss anemia with hemoglobin coming back at 8.4 today.  She is  being mobilized with physical therapy, has had some problems with pain  control, as well as complaints of dizziness.  Blood pressures have been on  the low side.  She has had some atelectasis.   REVIEW OF SYSTEMS:  Positive for dizziness when arising.  She feels weak all  over.  She has pain in the left knee.  She has had some reflux and cough.   PAST HISTORY:  1.  Hypertension.  2.  Coronary artery disease.  3.  Permanent pacemaker placed, 2002.  4.  Hysterectomy.  5.  Cholecystectomy.  6.  Incisional hernia, umbilicus.   FAMILY HISTORY:  Positive for CAD as well as positive stomach carcinoma.   SOCIAL HISTORY:  Married, lives with her husband in a one-level home, three  steps to enter, moved from [place] three years ago due to her husband's  retirement and his local roots being from [place].   FUNCTIONAL HISTORY:  Independent, occasionally using a walker prior to  admission.   CURRENT FUNCTIONAL STATUS:  As noted, impaired mobility and ADLs.   HOME MEDICATIONS:  1.  Coreg 25 mg p.o. b.i.d.  2.  Maxzide 37.5/25 p.o. every day.  3.  Norvasc 10  mg p.o. every day.  4.  Prevacid 30 mg p.o. every day.  5.  Avapro 300 mg p.o. every day.  6.  Aspirin one p.o. every day.  7.  Glucosamine.   ALLERGIES:  None known.   PHYSICAL EXAMINATION:  VITAL SIGNS:  Blood pressure 100/52, pulse 84,  respirations 8, temp 98.4.  GENERAL:  Obese female, alert and oriented x3.  EYES:  Anicteric, not injected.  ENT:  External ENT normal.  NECK:  Supple without adenopathy.  RESPIRATORY:  Effort has decreased breath sounds at the bases otherwise  clear.  HEART:  Regular rate and rhythm.  No rubs, murmurs, extra sounds.  ABDOMEN:  Positive bowel sounds.  She has a midline supraumbilical hernia,  otherwise no masses or organomegaly.  MUSCULOSKELETAL:  She has decreased range of motion and stability left knee.  Her left knee has mild effusion.  She has normal sensation in the lower  extremities.  NEUROLOGIC:  Mood, memory, and affect  are all normal.  Her motor strength is  5/5 bilateral upper extremities in the deltoid, biceps, triceps, finger  flexors, as well as the right hip flexor, knee extensor, ankle dorsiflexors  and plantar flexor.  On the left lower extremity, she has 1 at the hip  flexor, Quad, TA and 3- at the PA and gastroc.   IMPRESSION:  1.  Functional deficits due to left knee osteoarthritis.  2.  Left total knee replacement, postoperative day number three.      Weightbearing as tolerated.  Start physical therapy occupational therapy      subacute care unit level.  3.  Pain management for left knee pain.  Schedule ultrasound with as needed      oxycodone and Robaxin.  4.  Deep vein thrombosis prophylaxis.  Continue Coumadin, pharmacy protocol.  5.  Hypertension.  We will hold on Maxzide and Norvasc, as her blood      pressure has been on the low side.  We will check orthostatic blood      pressure in the patient.  6.  Acute blood loss anemia.  Recheck hemoglobin in the morning, may need a      transfusion of two units.  7.   Constipation.  No bowel movement times seven days.  Sorbitol today,      start senna q.h.s.   ESTIMATED LENGTH OF STAY:  Seven to 10 days.   The patient is a good rehab candidate.   GOALS:  Go home with family to assist.      Erick Colace, M.D.  Electronically Signed     AEK/MEDQ  D:  03/06/2005  T:  03/06/2005  Job:  [id number]   cc:   [name], MD  Fax: [phone]   G. Dorene Grebe, M.D.  Fax: [phone]

## 2010-12-29 ENCOUNTER — Ambulatory Visit (INDEPENDENT_AMBULATORY_CARE_PROVIDER_SITE_OTHER): Payer: Medicare Other | Admitting: *Deleted

## 2010-12-29 ENCOUNTER — Encounter: Payer: Self-pay | Admitting: Internal Medicine

## 2010-12-29 DIAGNOSIS — Z950 Presence of cardiac pacemaker: Secondary | ICD-10-CM

## 2010-12-29 DIAGNOSIS — I495 Sick sinus syndrome: Secondary | ICD-10-CM

## 2010-12-29 LAB — PACEMAKER DEVICE OBSERVATION
AL IMPEDENCE PM: 412.5 Ohm
ATRIAL PACING PM: 93
BAMS-0001: 150 {beats}/min
BAMS-0003: 80 {beats}/min
BATTERY VOLTAGE: 2.9629 V
BRDY-0002RV: 70 {beats}/min
BRDY-0003RV: 120 {beats}/min
VENTRICULAR PACING PM: 99

## 2010-12-29 NOTE — Progress Notes (Signed)
PACER CHECK IN CLINIC  

## 2011-01-06 ENCOUNTER — Ambulatory Visit (INDEPENDENT_AMBULATORY_CARE_PROVIDER_SITE_OTHER): Payer: Medicare Other | Admitting: Internal Medicine

## 2011-01-06 ENCOUNTER — Encounter: Payer: Self-pay | Admitting: Internal Medicine

## 2011-01-06 DIAGNOSIS — Z950 Presence of cardiac pacemaker: Secondary | ICD-10-CM

## 2011-01-06 DIAGNOSIS — I10 Essential (primary) hypertension: Secondary | ICD-10-CM

## 2011-01-06 DIAGNOSIS — I495 Sick sinus syndrome: Secondary | ICD-10-CM

## 2011-01-06 DIAGNOSIS — I4891 Unspecified atrial fibrillation: Secondary | ICD-10-CM

## 2011-01-06 LAB — PACEMAKER DEVICE OBSERVATION
BRDY-0002RV: 70 {beats}/min
DEVICE MODEL PM: 7203906
RV LEAD IMPEDENCE PM: 462.5 Ohm

## 2011-01-06 MED ORDER — WARFARIN SODIUM 4 MG PO TABS: 4.0000 mg | ORAL_TABLET | Freq: Every day | ORAL | Status: DC

## 2011-01-06 NOTE — Progress Notes (Signed)
HPI [name] returns today for followup. She is a 77 year old woman with hypertension, symptomatic bradycardia, status post permanent pacemaker insertion. The patient notes that over the last several weeks she has felt fatigue and weakness at times. No syncope. No peripheral edema. No chest pain. No Known Allergies   Current Outpatient Prescriptions  Medication Sig Dispense Refill  . amLODipine-valsartan (EXFORGE) 10-320 MG per tablet Take 1 tablet by mouth daily.        [name] aspirin 325 MG tablet Take 325 mg by mouth daily.        . carvedilol (COREG) 25 MG tablet Take 25 mg by mouth 2 (two) times daily with a meal.        . ergocalciferol (VITAMIN D2) 50000 UNITS capsule Take 50,000 Units by mouth once a week.        . furosemide (LASIX) 20 MG tablet Take 20 mg by mouth daily. Take 1/2 to 1 tablet every morning for increased swelling      . lansoprazole (PREVACID) 15 MG capsule Take 15 mg by mouth daily.        . traMADol (ULTRAM) 50 MG tablet Take 100 mg by mouth 4 (four) times daily.       [name] warfarin (COUMADIN) 4 MG tablet Take 1 tablet (4 mg total) by mouth daily.  45 tablet  3     Past Medical History  Diagnosis Date  . HTN (hypertension)   . CAD (coronary artery disease)   . Hernia     Incisional hernia, umbilicus.    ROS:   All systems reviewed and negative except as noted in the HPI.   Past Surgical History  Procedure Date  . Pacemaker placement , 2002.  [name] Cholecystectomy      Family History  Problem Relation Age of Onset  . Coronary artery disease       History   Social History  . Marital Status: Married    Spouse Name: N/A    Number of Children: N/A  . Years of Education: N/A   Occupational History  . Not on file.   Social History Main Topics  . Smoking status: Former Smoker    Types: Cigarettes  . Smokeless tobacco: Not on file  . Alcohol Use: Not on file  . Drug Use: Not on file  . Sexually Active: Not on file   Other Topics Concern  . Not  on file   Social History Narrative    Married, lives with her husband in a one-level home, three steps to enter, moved from [place] three years ago due to her husband's retirement and his local roots being from [place].     BP 114/70  Pulse 80  Ht 5\' 8"  (1.727 m)  Wt 89.721 kg (197 lb 12.8 oz)  BMI 30.08 kg/m2  Physical Exam:  Well appearing NAD HEENT: Unremarkable Neck:  No JVD, no thyromegally Lungs:  Clear with no wheezes, rales, or rhonchi. Well-healed pacemaker incision. HEART:  Regular rate rhythm, no murmurs, no rubs, no clicks Abd:  soft, positive bowel sounds, no organomegally, no rebound, no guarding Ext:  2 plus pulses, no edema, no cyanosis, no clubbing Skin:  No rashes no nodules Neuro:  CN II through XII intact, motor grossly intact  DEVICE  Normal device function.  See PaceArt for details.   Assess/Plan:

## 2011-01-06 NOTE — Assessment & Plan Note (Signed)
This appears to be a new problem. She is out of rhythm today. She did not feel palpitations. She does have an increased thromboembolic risk secondary to her hypertension and advanced age. I recommended starting warfarin. She'll be referred to our clinic. The patient does not have palpitations. Today we also discussed rhythm versus rate control. Because of her underlying complete heart block, her ventricular rate is well controlled and she is pacing in the ventricle 99% of the time. She may well feel worse with this. After we get her on therapeutic anticoagulation we will consider rhythm control strategies.

## 2011-01-06 NOTE — Assessment & Plan Note (Signed)
Her blood pressure remains well controlled. She will continue her current medical therapy.

## 2011-01-06 NOTE — Patient Instructions (Signed)
Your physician recommends that you schedule a follow-up appointment in: 6-8 weeks with Dr Ladona Ridgel  Your physician has recommended you make the following change in your medication:  1) Start Coumadin 4mg  daily  You have been referred to the Coumadin Clinic

## 2011-01-13 ENCOUNTER — Ambulatory Visit (INDEPENDENT_AMBULATORY_CARE_PROVIDER_SITE_OTHER): Payer: Medicare Other | Admitting: *Deleted

## 2011-01-13 DIAGNOSIS — I4891 Unspecified atrial fibrillation: Secondary | ICD-10-CM

## 2011-01-13 DIAGNOSIS — Z7901 Long term (current) use of anticoagulants: Secondary | ICD-10-CM | POA: Insufficient documentation

## 2011-01-13 LAB — POCT INR: INR: 1.9

## 2011-01-20 ENCOUNTER — Ambulatory Visit (INDEPENDENT_AMBULATORY_CARE_PROVIDER_SITE_OTHER): Payer: Medicare Other | Admitting: *Deleted

## 2011-01-20 DIAGNOSIS — I4891 Unspecified atrial fibrillation: Secondary | ICD-10-CM

## 2011-01-20 DIAGNOSIS — Z7901 Long term (current) use of anticoagulants: Secondary | ICD-10-CM

## 2011-01-20 LAB — POCT INR: INR: 2.3

## 2011-01-26 ENCOUNTER — Telehealth: Payer: Self-pay | Admitting: Cardiology

## 2011-01-26 NOTE — Telephone Encounter (Signed)
Advised pt per Dr Ladona Ridgel that Aspirin 81mg  would be fine. Pt verbalizes understanding.

## 2011-01-26 NOTE — Telephone Encounter (Signed)
Message copied by Karle Plumber on Mon Jan 26, 2011  4:21 PM ------      Message from: Marinus Maw      Created: Tue Jan 20, 2011  7:11 PM      Regarding: RE: medication clarification      Contact: [phone]       Asa 81 mg would be fine.      ----- Message -----         From: Carollee Sires, RN         Sent: 01/13/2011  12:01 PM           To: Lewayne Bunting, MD      Subject: medication clarification                                 We saw Mrs [name] in the coumadin clinic today she had a question about her aspirin. She is currently taking 325mg  daily. Would you like her to stop, reduce or continue her aspirin. Her inr today after 6 days of coumadin 1.9. Please advise. Thank you

## 2011-01-27 ENCOUNTER — Ambulatory Visit (INDEPENDENT_AMBULATORY_CARE_PROVIDER_SITE_OTHER): Payer: Medicare Other | Admitting: *Deleted

## 2011-01-27 DIAGNOSIS — I4891 Unspecified atrial fibrillation: Secondary | ICD-10-CM

## 2011-01-27 DIAGNOSIS — Z7901 Long term (current) use of anticoagulants: Secondary | ICD-10-CM

## 2011-02-12 ENCOUNTER — Ambulatory Visit (INDEPENDENT_AMBULATORY_CARE_PROVIDER_SITE_OTHER): Payer: Medicare Other | Admitting: *Deleted

## 2011-02-12 DIAGNOSIS — Z7901 Long term (current) use of anticoagulants: Secondary | ICD-10-CM

## 2011-02-12 DIAGNOSIS — I4891 Unspecified atrial fibrillation: Secondary | ICD-10-CM

## 2011-02-12 LAB — POCT INR: INR: 3.2

## 2011-02-27 ENCOUNTER — Encounter: Payer: Medicare Other | Admitting: *Deleted

## 2011-03-03 ENCOUNTER — Ambulatory Visit (INDEPENDENT_AMBULATORY_CARE_PROVIDER_SITE_OTHER): Payer: Medicare Other | Admitting: Pharmacist

## 2011-03-03 ENCOUNTER — Encounter: Payer: Self-pay | Admitting: Internal Medicine

## 2011-03-03 ENCOUNTER — Ambulatory Visit (INDEPENDENT_AMBULATORY_CARE_PROVIDER_SITE_OTHER): Payer: Medicare Other | Admitting: Internal Medicine

## 2011-03-03 DIAGNOSIS — I4891 Unspecified atrial fibrillation: Secondary | ICD-10-CM

## 2011-03-03 DIAGNOSIS — I495 Sick sinus syndrome: Secondary | ICD-10-CM

## 2011-03-03 DIAGNOSIS — I10 Essential (primary) hypertension: Secondary | ICD-10-CM

## 2011-03-03 DIAGNOSIS — Z950 Presence of cardiac pacemaker: Secondary | ICD-10-CM

## 2011-03-03 DIAGNOSIS — Z7901 Long term (current) use of anticoagulants: Secondary | ICD-10-CM

## 2011-03-03 LAB — PACEMAKER DEVICE OBSERVATION
AL AMPLITUDE: 0.5 mv
BRDY-0002RV: 70 {beats}/min
BRDY-0003RV: 120 {beats}/min
BRDY-0004RV: 120 {beats}/min
RV LEAD IMPEDENCE PM: 450 Ohm

## 2011-03-03 LAB — POCT INR: INR: 1.6

## 2011-03-03 NOTE — Assessment & Plan Note (Signed)
Her device is working normally. We'll plan to recheck in several months. 

## 2011-03-03 NOTE — Assessment & Plan Note (Signed)
The patient remains in atrial fibrillation with 100% ventricular pacing.

## 2011-03-03 NOTE — Progress Notes (Signed)
HPI Mrs. Pouncey returns today for followup. She is a pleasant 78 yo woman with a h/o symptomatic bradycardia. She notes some peripheral edema. She admits to sodium indiscretion. She denies shortness of breath. No chest pain and no syncope. No Known Allergies   Current Outpatient Prescriptions  Medication Sig Dispense Refill  . amLODipine-valsartan (EXFORGE) 10-320 MG per tablet Take 1 tablet by mouth daily.        [name] aspirin 325 MG tablet Take 325 mg by mouth daily.        . carvedilol (COREG) 25 MG tablet Take 25 mg by mouth 2 (two) times daily with a meal.        . ergocalciferol (VITAMIN D2) 50000 UNITS capsule Take 50,000 Units by mouth once a week.        . furosemide (LASIX) 20 MG tablet Take 20 mg by mouth daily. Take 1/2 to 1 tablet every morning for increased swelling      . lansoprazole (PREVACID) 15 MG capsule Take 15 mg by mouth daily.        . traMADol (ULTRAM) 50 MG tablet Take 100 mg by mouth 4 (four) times daily.       [name] warfarin (COUMADIN) 4 MG tablet Take 1 tablet (4 mg total) by mouth daily.  45 tablet  3     Past Medical History  Diagnosis Date  . HTN (hypertension)   . CAD (coronary artery disease)   . Hernia     Incisional hernia, umbilicus.  . H/O: hysterectomy     ROS:   All systems reviewed and negative except as noted in the HPI.   Past Surgical History  Procedure Date  . Pacemaker placement , 2002.  [name] Cholecystectomy      Family History  Problem Relation Age of Onset  . Coronary artery disease    . Other      stomach carcinoma     History   Social History  . Marital Status: Married    Spouse Name: N/A    Number of Children: N/A  . Years of Education: N/A   Occupational History  . Not on file.   Social History Main Topics  . Smoking status: Former Smoker    Types: Cigarettes  . Smokeless tobacco: Not on file  . Alcohol Use: Not on file  . Drug Use: Not on file  . Sexually Active: Not on file   Other Topics Concern  . Not on  file   Social History Narrative    Married, lives with her husband in a one-level home, three steps to enter, moved from [place] three years ago due to her husband's retirement and his local roots being from [place].     BP 129/85  Pulse 92  Ht 5\' 9"  (1.753 m)  Wt 97.977 kg (216 lb)  BMI 31.90 kg/m2  Physical Exam:  Well appearing 78 year old woman, NAD HEENT: Unremarkable Neck:  No JVD, no thyromegally Lungs:  Clear with no wheezes, rales, or rhonchi. HEART:  Regular rate rhythm, no murmurs, no rubs, no clicks Abd:  soft, positive bowel sounds, no organomegally, no rebound, no guarding Ext:  2 plus pulses, no edema, no cyanosis, no clubbing Skin:  No rashes no nodules Neuro:  CN II through XII intact, motor grossly intact  DEVICE  Normal device function.  See PaceArt for details.   Assess/Plan:

## 2011-03-03 NOTE — Patient Instructions (Signed)
Your physician wants you to follow-up in: 12 months with Dr Court Joy will receive a reminder letter in the mail two months in advance. If you don't receive a letter, please call our office to schedule the follow-up appointment.   Remote monitoring is used to monitor your Pacemaker of ICD from home. This monitoring reduces the number of office visits required to check your device to one time per year. It allows [place] to keep an eye on the functioning of your device to ensure it is working properly. You are scheduled for a device check from home on 06/04/11. You may send your transmission at any time that day. If you have a wireless device, the transmission will be sent automatically. After your physician reviews your transmission, you will receive a postcard with your next transmission date.

## 2011-03-03 NOTE — Assessment & Plan Note (Signed)
Her blood pressure is well controlled. She does note occasional dizziness when she stands up quickly. I recommended that she continue with her current medications but that if her dizziness worsens we may have to allow her blood pressure to go up a bit.

## 2011-03-18 ENCOUNTER — Ambulatory Visit (INDEPENDENT_AMBULATORY_CARE_PROVIDER_SITE_OTHER): Payer: Medicare Other | Admitting: Pharmacist

## 2011-03-18 DIAGNOSIS — Z7901 Long term (current) use of anticoagulants: Secondary | ICD-10-CM

## 2011-03-18 DIAGNOSIS — I4891 Unspecified atrial fibrillation: Secondary | ICD-10-CM

## 2011-03-18 LAB — POCT INR: INR: 2.3

## 2011-04-09 ENCOUNTER — Encounter: Payer: Medicare Other | Admitting: *Deleted

## 2011-04-15 ENCOUNTER — Ambulatory Visit (INDEPENDENT_AMBULATORY_CARE_PROVIDER_SITE_OTHER): Payer: Medicare Other | Admitting: Internal Medicine

## 2011-04-15 ENCOUNTER — Encounter: Payer: Self-pay | Admitting: Internal Medicine

## 2011-04-15 ENCOUNTER — Ambulatory Visit (INDEPENDENT_AMBULATORY_CARE_PROVIDER_SITE_OTHER): Payer: Medicare Other | Admitting: *Deleted

## 2011-04-15 DIAGNOSIS — I4891 Unspecified atrial fibrillation: Secondary | ICD-10-CM

## 2011-04-15 DIAGNOSIS — Z7901 Long term (current) use of anticoagulants: Secondary | ICD-10-CM

## 2011-04-15 DIAGNOSIS — R0789 Other chest pain: Secondary | ICD-10-CM

## 2011-04-15 DIAGNOSIS — I495 Sick sinus syndrome: Secondary | ICD-10-CM

## 2011-04-15 DIAGNOSIS — R079 Chest pain, unspecified: Secondary | ICD-10-CM

## 2011-04-15 DIAGNOSIS — Z950 Presence of cardiac pacemaker: Secondary | ICD-10-CM

## 2011-04-15 LAB — POCT INR: INR: 2.6

## 2011-04-15 LAB — PACEMAKER DEVICE OBSERVATION
ATRIAL PACING PM: 1
BRDY-0002RV: 70 {beats}/min
BRDY-0004RV: 120 {beats}/min
DEVICE MODEL PM: 7203906
VENTRICULAR PACING PM: 100

## 2011-04-15 NOTE — Assessment & Plan Note (Addendum)
This is a new problem. She has multiple cardiac risk factors. She is unable to walk. She has underlying ventricular pacing. I recommended that the patient undergo a Lexiscan Myoview. Additional recommendations will depend on the results of this study. In addition, she is given a prescription for sublingual nitroglycerin to take if she has recurrent symptoms.

## 2011-04-15 NOTE — Assessment & Plan Note (Signed)
Her ventricular rate appears to be well controlled. She is predominantly ventricularly paced.

## 2011-04-15 NOTE — Progress Notes (Signed)
HPI [name] returns today for followup. She is a very pleasant 78 year old woman with a history of symptomatic bradycardia status post pacemaker insertion. She has chronic resistant atrial fibrillation. She is sedentary and has difficulty walking. She was in her usual state of health until several days ago when she experienced an episode of substernal chest pain radiating to the neck and jaw and into the shoulders. It was associated with shortness of breath. The episode lasted 10 or 15 minutes and resolve spontaneously. She did not seek medical attention. She returns today for followup. She denies syncope or peripheral edema. No Known Allergies   Current Outpatient Prescriptions  Medication Sig Dispense Refill  . amLODipine-valsartan (EXFORGE) 10-320 MG per tablet Take 1 tablet by mouth daily.        [name] aspirin 325 MG tablet Take 325 mg by mouth daily.        . carvedilol (COREG) 25 MG tablet Take 25 mg by mouth 2 (two) times daily with a meal.        . ergocalciferol (VITAMIN D2) 50000 UNITS capsule Take 50,000 Units by mouth once a week.        . furosemide (LASIX) 20 MG tablet Take 20 mg by mouth daily. Take 1/2 to 1 tablet every morning for increased swelling      . lansoprazole (PREVACID) 15 MG capsule Take 15 mg by mouth daily.        . traMADol (ULTRAM) 50 MG tablet Take 100 mg by mouth 4 (four) times daily.       [name] warfarin (COUMADIN) 4 MG tablet Take 1 tablet (4 mg total) by mouth daily.  45 tablet  3     Past Medical History  Diagnosis Date  . HTN (hypertension)   . CAD (coronary artery disease)   . Hernia     Incisional hernia, umbilicus.  . H/O: hysterectomy     ROS:   All systems reviewed and negative except as noted in the HPI.   Past Surgical History  Procedure Date  . Pacemaker placement , 2002.  [name] Cholecystectomy      Family History  Problem Relation Age of Onset  . Coronary artery disease    . Other      stomach carcinoma     History   Social  History  . Marital Status: Married    Spouse Name: N/A    Number of Children: N/A  . Years of Education: N/A   Occupational History  . Not on file.   Social History Main Topics  . Smoking status: Former Smoker    Types: Cigarettes  . Smokeless tobacco: Not on file  . Alcohol Use: Not on file  . Drug Use: Not on file  . Sexually Active: Not on file   Other Topics Concern  . Not on file   Social History Narrative    Married, lives with her husband in a one-level home, three steps to enter, moved from [place] three years ago due to her husband's retirement and his local roots being from [place].     BP 102/60  Pulse 72  Wt 96.072 kg (211 lb 12.8 oz)  Physical Exam:  Well appearing 78 year old woman, NAD HEENT: Unremarkable Neck:  No JVD, no thyromegally Lungs:  Clear with no wheezes, rales, or rhonchi. Well-healed pacemaker incision. HEART:  Regular rate rhythm, no murmurs, no rubs, no clicks Abd:  soft, positive bowel sounds, no organomegally, no rebound, no guarding Ext:  2 plus  pulses, no edema, no cyanosis, no clubbing Skin:  No rashes no nodules Neuro:  CN II through XII intact, motor grossly intact  DEVICE  Normal device function.  See PaceArt for details.   Assess/Plan:

## 2011-04-15 NOTE — Patient Instructions (Signed)
Your physician wants you to follow-up in: 12 months with Dr Court Joy will receive a reminder letter in the mail two months in advance. If you don't receive a letter, please call our office to schedule the follow-up appointment.   Remote monitoring is used to monitor your Pacemaker of ICD from home. This monitoring reduces the number of office visits required to check your device to one time per year. It allows [place] to keep an eye on the functioning of your device to ensure it is working properly. You are scheduled for a device check from home on 07/16/11. You may send your transmission at any time that day. If you have a wireless device, the transmission will be sent automatically. After your physician reviews your transmission, you will receive a postcard with your next transmission date.   Your physician has requested that you have a lexiscan myoview. For further information please visit [email or web address] Please follow instruction sheet, as given.

## 2011-04-15 NOTE — Assessment & Plan Note (Signed)
Her device is working normally. We'll plan to recheck in several months. 

## 2011-04-17 ENCOUNTER — Telehealth: Payer: Self-pay | Admitting: Internal Medicine

## 2011-04-17 DIAGNOSIS — R079 Chest pain, unspecified: Secondary | ICD-10-CM

## 2011-04-17 MED ORDER — NITROGLYCERIN 0.4 MG SL SUBL: 0.4000 mg | SUBLINGUAL_TABLET | SUBLINGUAL | Status: DC | PRN

## 2011-04-17 NOTE — Telephone Encounter (Signed)
Please return call to patient at hm# [phone]  Patient was suppose to receive nitro pills per Dr. Ladona Ridgel on last visit.  Rx was never received.  Please return call to patient to discuss.     Preferred pharmacy verified as [facility]

## 2011-04-17 NOTE — Telephone Encounter (Signed)
The patient is aware this has been done.

## 2011-04-29 ENCOUNTER — Other Ambulatory Visit (HOSPITAL_COMMUNITY): Payer: Medicare Other

## 2011-04-29 ENCOUNTER — Ambulatory Visit (HOSPITAL_COMMUNITY): Payer: Medicare Other | Attending: Internal Medicine | Admitting: Radiology

## 2011-04-29 DIAGNOSIS — R000 Tachycardia, unspecified: Secondary | ICD-10-CM | POA: Insufficient documentation

## 2011-04-29 DIAGNOSIS — R0989 Other specified symptoms and signs involving the circulatory and respiratory systems: Secondary | ICD-10-CM | POA: Insufficient documentation

## 2011-04-29 DIAGNOSIS — I10 Essential (primary) hypertension: Secondary | ICD-10-CM | POA: Insufficient documentation

## 2011-04-29 DIAGNOSIS — Z87891 Personal history of nicotine dependence: Secondary | ICD-10-CM | POA: Insufficient documentation

## 2011-04-29 DIAGNOSIS — I4891 Unspecified atrial fibrillation: Secondary | ICD-10-CM | POA: Insufficient documentation

## 2011-04-29 DIAGNOSIS — Z9861 Coronary angioplasty status: Secondary | ICD-10-CM | POA: Insufficient documentation

## 2011-04-29 DIAGNOSIS — R079 Chest pain, unspecified: Secondary | ICD-10-CM

## 2011-04-29 DIAGNOSIS — R0609 Other forms of dyspnea: Secondary | ICD-10-CM | POA: Insufficient documentation

## 2011-04-29 DIAGNOSIS — R002 Palpitations: Secondary | ICD-10-CM | POA: Insufficient documentation

## 2011-04-29 DIAGNOSIS — Z8249 Family history of ischemic heart disease and other diseases of the circulatory system: Secondary | ICD-10-CM | POA: Insufficient documentation

## 2011-04-29 DIAGNOSIS — R0602 Shortness of breath: Secondary | ICD-10-CM | POA: Insufficient documentation

## 2011-04-29 DIAGNOSIS — R42 Dizziness and giddiness: Secondary | ICD-10-CM | POA: Insufficient documentation

## 2011-04-29 MED ORDER — TECHNETIUM TC 99M TETROFOSMIN IV KIT
10.0000 | PACK | Freq: Once | INTRAVENOUS | Status: AC | PRN
  Administered 2011-04-29: 10 via INTRAVENOUS

## 2011-04-29 MED ORDER — REGADENOSON 0.4 MG/5ML IV SOLN
0.4000 mg | Freq: Once | INTRAVENOUS | Status: AC
  Administered 2011-04-29: 0.4 mg via INTRAVENOUS

## 2011-04-29 MED ORDER — TECHNETIUM TC 99M TETROFOSMIN IV KIT
30.0000 | PACK | Freq: Once | INTRAVENOUS | Status: AC | PRN
  Administered 2011-04-29: 30 via INTRAVENOUS

## 2011-04-29 NOTE — Progress Notes (Signed)
[facility] SITE 3 NUCLEAR MED [address] [place] [address] [phone]  Cardiology Nuclear Med Study  [name] is a 78 y.o. female     MRN : [id number]     DOB: 1932-02-21  Procedure Date: 04/29/2011  Nuclear Med Background Indication for Stress Test:  Evaluation for Ischemia History:  Chronic A-Fib, > 2000  Heart Catheterization & GXT in [place] per patient, 2002- Pacemaker Cardiac Risk Factors: Family History - CAD, History of Smoking and Hypertension  Symptoms:  Chest Pain (last date of chest discomfort 4 days ago), Dizziness, DOE, Light-Headedness, Palpitations, Rapid HR and SOB   Nuclear Pre-Procedure Caffeine/Decaff Intake:  None NPO After: 9:30pm   Lungs:  clear O2 Sat: 96% on room air. IV 0.9% NS with Angio Cath:  22g  IV Site: R Hand  IV Started by:  Bonnita Levan, RN  Chest Size (in):  46 Cup Size: D  Height: 5\' 10"  (1.778 m)  Weight:  200 lb (90.719 kg)  BMI:  Body mass index is 28.70 kg/(m^2). Tech Comments:  Patient took all Meds this AM    Nuclear Med Study 1 or 2 day study: 1 day  Stress Test Type:  Lexiscan  Reading MD: Charlton Haws, MD  Order Authorizing Provider:  Lewayne Bunting, MD  Resting Radionuclide: Technetium 62m Tetrofosmin  Resting Radionuclide Dose: 11.0 mCi   Stress Radionuclide:  Technetium 75m Tetrofosmin  Stress Radionuclide Dose: 31.1 mCi           Stress Protocol Rest HR: 80 Stress HR: 81  Rest BP: 108/70 Stress BP: 113/68  Exercise Time (min): n/a METS: n/a   Predicted Max HR: 143 bpm % Max HR: 56.64 bpm Rate Pressure Product: 9234   Dose of Adenosine (mg):  n/a Dose of Lexiscan: 0.4 mg  Dose of Atropine (mg): n/a Dose of Dobutamine: n/a mcg/kg/min (at max HR)  Stress Test Technologist: Bonnita Levan, RN  Nuclear Technologist:  Domenic Polite, CNMT     Rest Procedure:  Myocardial perfusion imaging was performed at rest 45 minutes following the intravenous administration of Technetium 62m  Tetrofosmin. Rest ECG: Paced Rhythm  Stress Procedure:  The patient received IV Lexiscan 0.4 mg over 15-seconds.  Technetium 15m Tetrofosmin injected at 30-seconds.  There were no significant changes with Lexiscan.  Quantitative spect images were obtained after a 45 minute delay. Stress ECG: V pacing  QPS Raw Data Images:  Patient motion noted. Stress Images:  There is decreased uptake in the inferior wall. Rest Images:  There is decreased uptake in the inferior wall. Subtraction (SDS):  There is a fixed defect that is most consistent with a previous infarction. Transient Ischemic Dilatation (Normal <1.22):  1.09 Lung/Heart Ratio (Normal <0.45):  0.32  Quantitative Gated Spect Images QGS EDV:  174 ml QGS ESV:  119 ml  Impression Exercise Capacity:  Lexiscan with no exercise. BP Response:  Normal blood pressure response. Clinical Symptoms:  No significant symptoms noted. ECG Impression:  V pacing Comparison with Prior Nuclear Study: No images to compare  Overall Impression:  Small inferior wall infarct from apex to base with mild peri-infarct ischemia.  Diffuse hypokinesis with abnormal septal motion.    LV Ejection Fraction: 32%.  LV Wall Motion:  Diffuse hypokinesis abnormal septal motion  EF 32%  Charlton Haws

## 2011-05-07 ENCOUNTER — Telehealth: Payer: Self-pay | Admitting: *Deleted

## 2011-05-07 ENCOUNTER — Encounter: Payer: Self-pay | Admitting: Physician Assistant

## 2011-05-07 ENCOUNTER — Encounter: Payer: Self-pay | Admitting: *Deleted

## 2011-05-07 ENCOUNTER — Ambulatory Visit (INDEPENDENT_AMBULATORY_CARE_PROVIDER_SITE_OTHER): Payer: Medicare Other | Admitting: Physician Assistant

## 2011-05-07 ENCOUNTER — Encounter (HOSPITAL_COMMUNITY): Payer: Self-pay | Admitting: Pharmacy Technician

## 2011-05-07 VITALS — BP 102/58 | HR 63 | Ht 70.5 in | Wt 195.0 lb

## 2011-05-07 DIAGNOSIS — R079 Chest pain, unspecified: Secondary | ICD-10-CM

## 2011-05-07 DIAGNOSIS — I4891 Unspecified atrial fibrillation: Secondary | ICD-10-CM

## 2011-05-07 DIAGNOSIS — R011 Cardiac murmur, unspecified: Secondary | ICD-10-CM

## 2011-05-07 DIAGNOSIS — I10 Essential (primary) hypertension: Secondary | ICD-10-CM

## 2011-05-07 DIAGNOSIS — R9439 Abnormal result of other cardiovascular function study: Secondary | ICD-10-CM

## 2011-05-07 LAB — PROTIME-INR
INR: 3 ratio — ABNORMAL HIGH (ref 0.8–1.0)
Prothrombin Time: 32.7 s — ABNORMAL HIGH (ref 10.2–12.4)

## 2011-05-07 LAB — CBC WITH DIFFERENTIAL/PLATELET
Basophils Relative: 0.5 % (ref 0.0–3.0)
HCT: 43 % (ref 36.0–46.0)
Lymphs Abs: 1.7 10*3/uL (ref 0.7–4.0)
MCHC: 33.2 g/dL (ref 30.0–36.0)
MCV: 86.2 fl (ref 78.0–100.0)
Neutrophils Relative %: 56.9 % (ref 43.0–77.0)
Platelets: 206 10*3/uL (ref 150.0–400.0)
RBC: 4.99 Mil/uL (ref 3.87–5.11)

## 2011-05-07 LAB — BASIC METABOLIC PANEL
BUN: 25 mg/dL — ABNORMAL HIGH (ref 6–23)
Chloride: 98 mEq/L (ref 96–112)
Glucose, Bld: 97 mg/dL (ref 70–99)
Potassium: 3.2 mEq/L — ABNORMAL LOW (ref 3.5–5.1)

## 2011-05-07 MED ORDER — POTASSIUM CHLORIDE CRYS ER 20 MEQ PO TBCR: EXTENDED_RELEASE_TABLET | ORAL | Status: DC

## 2011-05-07 NOTE — H&P (Signed)
Patient seen and examined and history reviewed. Agree with above findings and plan. Patient presents with recent onset of chest pain. Nuclear stress test shows evidence of ischemia and low EF. Will proceed with diagnostic cardiac cath.  Thedora Hinders 05/07/2011 1:07 PM

## 2011-05-07 NOTE — H&P (Signed)
History and Physical  Date: 05/07/2011   Name: [name]  DOB: 02/04/1933  MRN: [id number]   PCP: Dr. Alberteen Sam  Primary Cardiologist: Dr. Lewayne Bunting  Primary Electrophysiologist: Dr. Lewayne Bunting   History of Present Illness:  [name] is a 78 y.o. female who presents for follow up after an abnormal stress test.   She is a prior patient of Dr. Rudean Haskell. She has a history of symptomatic bradycardia, status post pacer, persistent atrial fibrillation, hypertension, GERD, depression, diverticulosis, DJD, thyroid goiter. She is now followed by Dr. Ladona Ridgel. She was seen in followup 04/15/11 and noted an episode of severe chest pain associated with shortness of breath.   Lexiscan Myoview 04/29/11:  Overall Impression: Small inferior wall infarct from apex to base with mild peri-infarct ischemia. Diffuse hypokinesis with abnormal septal motion.  LV Ejection Fraction: 32%. LV Wall Motion: Diffuse hypokinesis abnormal septal motion EF 32%   Dr. Ladona Ridgel asked the patient to return today to arrange possible cardiac catheterization.   She has symptoms of chest pressure with housework. She has radiation up into her neck and shoulders. She has some dyspnea with this as well. She denies syncope, orthopnea, PND or edema. She has not used the nitroglycerin that Dr. Ladona Ridgel gave her.    Past Medical History   Diagnosis  Date   .  HTN (hypertension)    .  Hernia      Incisional hernia, umbilicus.   .  H/O: hysterectomy    .  Bradycardia      s/p pacer ([facility]enant => GT)   .  Atrial fibrillation    .  Depression    .  Goiter    .  DJD (degenerative joint disease)    .  GERD (gastroesophageal reflux disease)    .  Diverticulosis      Past Surgical History   Procedure  Date   .  Pacemaker placement  , 2002.   [name]  Cholecystectomy      Current Outpatient Prescriptions   Medication  Sig  Dispense  Refill   .  amLODipine-valsartan (EXFORGE) 10-320 MG per tablet  Take 1 tablet by mouth daily.     [name]   aspirin 325 MG tablet  Take 325 mg by mouth daily.     .  carvedilol (COREG) 25 MG tablet  Take 25 mg by mouth 2 (two) times daily with a meal.     .  ergocalciferol (VITAMIN D2) 50000 UNITS capsule  Take 50,000 Units by mouth once a week.     .  furosemide (LASIX) 20 MG tablet  Take 20 mg by mouth daily. Take 1/2 to 1 tablet every morning for increased swelling     .  lansoprazole (PREVACID) 15 MG capsule  Take 15 mg by mouth daily.     .  nitroGLYCERIN (NITROSTAT) 0.4 MG SL tablet  Place 1 tablet (0.4 mg total) under the tongue every 5 (five) minutes as needed for chest pain.  25 tablet  3   .  traMADol (ULTRAM) 50 MG tablet  Take 100 mg by mouth 4 (four) times daily.     [name]  warfarin (COUMADIN) 4 MG tablet  Take 1 tablet (4 mg total) by mouth daily.  45 tablet  3     Allergies:  No Known Allergies    History   Substance Use Topics   .  Smoking status:  Former Smoker     Types:  Cigarettes   .  Smokeless  tobacco:  Not on file   .  Alcohol Use:  Not on file     Family History   Problem  Relation  Age of Onset   .  Coronary artery disease     .  Other        stomach carcinoma     ROS: Please see the history of present illness. All other systems reviewed and negative.   PHYSICAL EXAM:  VS: BP 102/58  Pulse 63  Ht 5' 10.5" (1.791 m)  Wt 195 lb (88.451 kg)  BMI 27.58 kg/m2  Well nourished, well developed, in no acute distress  HEENT: normal  Neck: no JVD  Vasc: no carotid bruits; no FA bruits bilat  Cardiac: normal S1, S2; RRR; 2/6 holosystolic murmur LLSB  Lungs: clear to auscultation bilaterally, no wheezing, rhonchi or rales  Abd: soft, nontender, no hepatomegaly  Ext: no edema  Skin: warm and dry  Neuro: CNs 2-12 intact, no focal abnormalities noted  Psych: normal affect   EKG: V paced HR 80   ASSESSMENT AND PLAN:   1.  Chest pain, unspecified  She has symptoms c/w exertional angina. Stress test markedly abnormal with low EF. She has MR murmur on exam. D/w Dr.  Peter [place]il[place] (DOD). Will arrange cath. Risks and benefits of cardiac catheterization have been discussed with the patient. These include bleeding, infection, kidney damage, stroke, heart attack, death. The patient understands these risks and is willing to proceed. She knows to use NTG if needed. She knows to go to the ED if symptoms are worse. Her BP is too soft to add Isosorbide. Add ASA 81 mg QD.    2.  Atrial fibrillation  No h/o stroke. Hold coumadin starting today and proceed with cath on Monday 05/11/11.    3.  Murmur  Sounds like MR. Will obtain echo as well.    4.  Essential hypertension, benign  Controlled. Continue current therapy.       [name], PA-C 8:56 AM 05/07/2011

## 2011-05-07 NOTE — Progress Notes (Signed)
[address] [place], [place]  [address] Phone: [phone] Fax:  [phone]  Date:  05/07/2011   Name:  [name]       DOB:  02/08/1933 MRN:  [id number]  PCP:  Dr. Alberteen Sam Primary Cardiologist:  Dr. Lewayne Bunting  Primary Electrophysiologist:  Dr. Lewayne Bunting    History of Present Illness: [name] is a 78 y.o. female who presents for follow up after an abnormal stress test.  She is a prior patient of Dr. Rudean Haskell.  She has a history of symptomatic bradycardia, status post pacer, persistent atrial fibrillation, hypertension, GERD, depression, diverticulosis, DJD, thyroid goiter.  She is now followed by Dr. Ladona Ridgel.  She was seen in followup 04/15/11 and noted an episode of severe chest pain associated with shortness of breath.    Lexiscan Myoview 04/29/11:  Overall Impression: Small inferior wall infarct from apex to base with mild peri-infarct ischemia. Diffuse hypokinesis with abnormal septal motion.  LV Ejection Fraction: 32%. LV Wall Motion: Diffuse hypokinesis abnormal septal motion EF 32%  Dr. Ladona Ridgel asked the patient to return today to arrange possible cardiac catheterization.  She has symptoms of chest pressure with housework.  She has radiation up into her neck and shoulders.  She has some dyspnea with this as well.  She denies syncope, orthopnea, PND or edema.  She has not used the nitroglycerin that Dr. Ladona Ridgel gave her.  Past Medical History  Diagnosis Date  . HTN (hypertension)   . Hernia     Incisional hernia, umbilicus.  . H/O: hysterectomy   . Bradycardia     s/p pacer ([facility] => GT)  . Atrial fibrillation   . Depression   . Goiter   . DJD (degenerative joint disease)   . GERD (gastroesophageal reflux disease)   . Diverticulosis    Past Surgical History  Procedure Date  . Pacemaker placement , 2002.  [name] Cholecystectomy      Current Outpatient Prescriptions  Medication Sig Dispense Refill  . amLODipine-valsartan (EXFORGE)  10-320 MG per tablet Take 1 tablet by mouth daily.        [name] aspirin 325 MG tablet Take 325 mg by mouth daily.        . carvedilol (COREG) 25 MG tablet Take 25 mg by mouth 2 (two) times daily with a meal.        . ergocalciferol (VITAMIN D2) 50000 UNITS capsule Take 50,000 Units by mouth once a week.        . furosemide (LASIX) 20 MG tablet Take 20 mg by mouth daily. Take 1/2 to 1 tablet every morning for increased swelling      . lansoprazole (PREVACID) 15 MG capsule Take 15 mg by mouth daily.        . nitroGLYCERIN (NITROSTAT) 0.4 MG SL tablet Place 1 tablet (0.4 mg total) under the tongue every 5 (five) minutes as needed for chest pain.  25 tablet  3  . traMADol (ULTRAM) 50 MG tablet Take 100 mg by mouth 4 (four) times daily.       [name] warfarin (COUMADIN) 4 MG tablet Take 1 tablet (4 mg total) by mouth daily.  45 tablet  3    Allergies: No Known Allergies  History  Substance Use Topics  . Smoking status: Former Smoker    Types: Cigarettes  . Smokeless tobacco: Not on file  . Alcohol Use: Not on file     Family History  Problem Relation Age of Onset  .  Coronary artery disease    . Other      stomach carcinoma    ROS:  Please see the history of present illness.   All other systems reviewed and negative.   PHYSICAL EXAM: VS:  BP 102/58  Pulse 63  Ht 5' 10.5" (1.791 m)  Wt 195 lb (88.451 kg)  BMI 27.58 kg/m2 Well nourished, well developed, in no acute distress HEENT: normal Neck: no JVD Vasc: no carotid bruits; no FA bruits bilat Cardiac:  normal S1, S2; RRR; 2/6 holosystolic murmur LLSB Lungs:  clear to auscultation bilaterally, no wheezing, rhonchi or rales Abd: soft, nontender, no hepatomegaly Ext: no edema Skin: warm and dry Neuro:  CNs 2-12 intact, no focal abnormalities noted Psych: normal affect  EKG:  V paced HR 80  ASSESSMENT AND PLAN:  1. Chest pain, unspecified  She has symptoms c/w exertional angina.  Stress test markedly abnormal with low EF.  She has MR  murmur on exam.  D/w Dr. Peter [place] (DOD).  Will arrange cath.  Risks and benefits of cardiac catheterization have been discussed with the patient.  These include bleeding, infection, kidney damage, stroke, heart attack, death.  The patient understands these risks and is willing to proceed.  She knows to use NTG if needed.  She knows to go to the ED if symptoms are worse.  Her BP is too soft to add Isosorbide.  Add ASA 81 mg QD.   2. Atrial fibrillation  No h/o stroke.  Hold coumadin starting today and proceed with cath on Monday 05/11/11.   3. Murmur  Sounds like MR.  Will obtain echo as well.   4. Essential hypertension, benign  Controlled.  Continue current therapy.           [name], PA-C  8:56 AM 05/07/2011

## 2011-05-07 NOTE — Telephone Encounter (Signed)
Message copied by Tarri Fuller on Thu May 07, 2011  2:03 PM ------      Message from: [place], [place] T      Created: Thu May 07, 2011  1:30 PM       Take K+ 40 mEq po x 1 dose only      Stop Lasix for now       She will need a stat bmet and INR morning of her cath to be done in short stay      Dr. Sw[place]and will need to be notified of results before cath begins      Make sure she starts ASA 81 mg QD      Notify cath lab - "no LV gram" due to renal insufficiency; echo is ordered      Tereso Newcomer, PA-C  1:27 PM 05/07/2011

## 2011-05-07 NOTE — Patient Instructions (Addendum)
Your physician recommends that you schedule a follow-up appointment in: 2 weeks after your cath Your physician recommends that you have for lab work drawn today (BMP, CBC, INR) Your physician has requested that you have an echocardiogram. Echocardiography is a painless test that uses sound waves to create images of your heart. It provides your doctor with information about the size and shape of your heart and how well your heart's chambers and valves are working. This procedure takes approximately one hour. There are no restrictions for this procedure.  Your physician has requested that you have a cardiac catheterization. Cardiac catheterization is used to diagnose and/or treat various heart conditions. Doctors may recommend this procedure for a number of different reasons. The most common reason is to evaluate chest pain. Chest pain can be a symptom of coronary artery disease (CAD), and cardiac catheterization can show whether plaque is narrowing or blocking your heart's arteries. This procedure is also used to evaluate the valves, as well as measure the blood flow and oxygen levels in different parts of your heart. For further information please visit [email or web address] Please follow instruction sheet, as given.  (05/11/11)

## 2011-05-07 NOTE — Telephone Encounter (Signed)
s/w Suzanna in the cath lab and she is aware of STAT BMET/INR to be done in Short Stay b4 cath and to notify Dr. Swaz[place] of results b4 cath and also "NO LV gram" per Tereso Newcomer, pa-c . [name] [name]  STAT BMET/PT/INR ORDER PLACED TODAY TO BE DONE @ SHORT STAY THE MORNING OF CATH 05/11/11

## 2011-05-07 NOTE — Telephone Encounter (Signed)
Message copied by Tarri Fuller on Thu May 07, 2011  1:54 PM ------      Message from: [place], [place] T      Created: Thu May 07, 2011  1:30 PM       Take K+ 40 mEq po x 1 dose only      Stop Lasix for now       She will need a stat bmet and INR morning of her cath to be done in short stay      Dr. Swaziland will need to be notified of results before cath begins      Make sure she starts ASA 81 mg QD      Notify cath lab - "no LV gram" due to renal insufficiency; echo is ordered      Tereso Newcomer, PA-C  1:27 PM 05/07/2011

## 2011-05-07 NOTE — Telephone Encounter (Signed)
pt notified of lab results and to d/c lasix for now and to take 1 dose of K+ 40 meq, will call K+ into [facility] today. Danielle Rankin

## 2011-05-11 ENCOUNTER — Encounter (HOSPITAL_COMMUNITY)
Admission: RE | Disposition: A | Discharge status: Home / Self Care | Payer: Self-pay | Source: Ambulatory Visit | Attending: Cardiology

## 2011-05-11 ENCOUNTER — Ambulatory Visit (HOSPITAL_COMMUNITY)
Admission: RE | Admit: 2011-05-11 | Discharge: 2011-05-11 | Disposition: A | Discharge status: Home / Self Care | Payer: Medicare Other | Source: Ambulatory Visit | Attending: Cardiology | Admitting: Cardiology

## 2011-05-11 DIAGNOSIS — I10 Essential (primary) hypertension: Secondary | ICD-10-CM | POA: Insufficient documentation

## 2011-05-11 DIAGNOSIS — M1990 Unspecified osteoarthritis, unspecified site: Secondary | ICD-10-CM | POA: Insufficient documentation

## 2011-05-11 DIAGNOSIS — F329 Major depressive disorder, single episode, unspecified: Secondary | ICD-10-CM | POA: Insufficient documentation

## 2011-05-11 DIAGNOSIS — K219 Gastro-esophageal reflux disease without esophagitis: Secondary | ICD-10-CM | POA: Insufficient documentation

## 2011-05-11 DIAGNOSIS — I4891 Unspecified atrial fibrillation: Secondary | ICD-10-CM | POA: Insufficient documentation

## 2011-05-11 DIAGNOSIS — E049 Nontoxic goiter, unspecified: Secondary | ICD-10-CM | POA: Insufficient documentation

## 2011-05-11 DIAGNOSIS — F3289 Other specified depressive episodes: Secondary | ICD-10-CM | POA: Insufficient documentation

## 2011-05-11 DIAGNOSIS — R9439 Abnormal result of other cardiovascular function study: Secondary | ICD-10-CM

## 2011-05-11 DIAGNOSIS — R079 Chest pain, unspecified: Secondary | ICD-10-CM

## 2011-05-11 LAB — BASIC METABOLIC PANEL
BUN: 23 mg/dL (ref 6–23)
GFR calc Af Amer: 49 mL/min — ABNORMAL LOW (ref >90)
GFR calc non Af Amer: 42 mL/min — ABNORMAL LOW (ref >90)
Potassium: 3.3 mEq/L — ABNORMAL LOW (ref 3.5–5.1)

## 2011-05-11 LAB — PROTIME-INR: Prothrombin Time: 22.5 seconds — ABNORMAL HIGH (ref 11.6–15.2)

## 2011-05-11 SURGERY — LEFT HEART CATHETERIZATION WITH CORONARY ANGIOGRAM
Anesthesia: LOCAL

## 2011-05-11 MED ORDER — SODIUM CHLORIDE 0.9 % IJ SOLN: 3.0000 mL | INTRAMUSCULAR | Status: DC | PRN

## 2011-05-11 MED ORDER — SODIUM CHLORIDE 0.9 % IJ SOLN: 3.0000 mL | Freq: Two times a day (BID) | INTRAMUSCULAR | Status: DC

## 2011-05-11 MED ORDER — SODIUM CHLORIDE 0.9 % IV SOLN
INTRAVENOUS | Status: DC
  Administered 2011-05-11: 1000 mL via INTRAVENOUS

## 2011-05-11 MED ORDER — ASPIRIN 81 MG PO CHEW: 324.0000 mg | CHEWABLE_TABLET | ORAL | Status: DC

## 2011-05-11 MED ORDER — POTASSIUM CHLORIDE CRYS ER 20 MEQ PO TBCR
EXTENDED_RELEASE_TABLET | ORAL | Status: AC
  Administered 2011-05-11: 40 meq via ORAL
  Filled 2011-05-11: qty 2

## 2011-05-11 MED ORDER — SODIUM CHLORIDE 0.9 % IV SOLN: 250.0000 mL | INTRAVENOUS | Status: DC | PRN

## 2011-05-11 MED ORDER — POTASSIUM CHLORIDE CRYS ER 20 MEQ PO TBCR
40.0000 meq | EXTENDED_RELEASE_TABLET | Freq: Once | ORAL | Status: AC
  Administered 2011-05-11: 40 meq via ORAL

## 2011-05-11 NOTE — Progress Notes (Signed)
Addended by: Reine Just on: 05/11/2011 01:56 PM   Modules accepted: Orders

## 2011-05-12 ENCOUNTER — Other Ambulatory Visit: Payer: Self-pay

## 2011-05-12 ENCOUNTER — Encounter (HOSPITAL_COMMUNITY)
Admission: RE | Disposition: A | Discharge status: Home / Self Care | Payer: Self-pay | Source: Ambulatory Visit | Attending: Cardiovascular Disease

## 2011-05-12 ENCOUNTER — Ambulatory Visit (HOSPITAL_COMMUNITY)
Admission: RE | Admit: 2011-05-12 | Discharge: 2011-05-12 | Disposition: A | Discharge status: Home / Self Care | Payer: Medicare Other | Source: Ambulatory Visit | Attending: Cardiovascular Disease | Admitting: Cardiovascular Disease

## 2011-05-12 ENCOUNTER — Ambulatory Visit (INDEPENDENT_AMBULATORY_CARE_PROVIDER_SITE_OTHER): Payer: Medicare Other | Admitting: Cardiovascular Disease

## 2011-05-12 DIAGNOSIS — Z7901 Long term (current) use of anticoagulants: Secondary | ICD-10-CM

## 2011-05-12 DIAGNOSIS — R9439 Abnormal result of other cardiovascular function study: Secondary | ICD-10-CM | POA: Insufficient documentation

## 2011-05-12 DIAGNOSIS — Z950 Presence of cardiac pacemaker: Secondary | ICD-10-CM | POA: Insufficient documentation

## 2011-05-12 DIAGNOSIS — I10 Essential (primary) hypertension: Secondary | ICD-10-CM | POA: Insufficient documentation

## 2011-05-12 DIAGNOSIS — R079 Chest pain, unspecified: Secondary | ICD-10-CM | POA: Insufficient documentation

## 2011-05-12 DIAGNOSIS — I4891 Unspecified atrial fibrillation: Secondary | ICD-10-CM

## 2011-05-12 DIAGNOSIS — I2510 Atherosclerotic heart disease of native coronary artery without angina pectoris: Secondary | ICD-10-CM

## 2011-05-12 HISTORY — PX: LEFT HEART CATHETERIZATION WITH CORONARY ANGIOGRAM: SHX5451

## 2011-05-12 LAB — BASIC METABOLIC PANEL
BUN: 24 mg/dL — ABNORMAL HIGH (ref 6–23)
Chloride: 101 mEq/L (ref 96–112)
GFR calc Af Amer: 52 mL/min — ABNORMAL LOW (ref >90)
GFR calc non Af Amer: 45 mL/min — ABNORMAL LOW (ref >90)
Glucose, Bld: 89 mg/dL (ref 70–99)
Potassium: 3.9 mEq/L (ref 3.5–5.1)
Sodium: 139 mEq/L (ref 135–145)

## 2011-05-12 LAB — PROTIME-INR: INR: 1.66 — ABNORMAL HIGH (ref 0.00–1.49)

## 2011-05-12 SURGERY — LEFT HEART CATHETERIZATION WITH CORONARY ANGIOGRAM
Anesthesia: LOCAL

## 2011-05-12 MED ORDER — SODIUM CHLORIDE 0.9 % IV SOLN: INTRAVENOUS | Status: DC

## 2011-05-12 MED ORDER — ONDANSETRON HCL 4 MG/2ML IJ SOLN: 4.0000 mg | Freq: Four times a day (QID) | INTRAMUSCULAR | Status: DC | PRN

## 2011-05-12 MED ORDER — SODIUM CHLORIDE 0.9 % IV SOLN: 250.0000 mL | INTRAVENOUS | Status: DC | PRN

## 2011-05-12 MED ORDER — LIDOCAINE HCL (PF) 1 % IJ SOLN
INTRAMUSCULAR | Status: AC
  Filled 2011-05-12: qty 30

## 2011-05-12 MED ORDER — SODIUM CHLORIDE 0.9 % IJ SOLN: 3.0000 mL | Freq: Two times a day (BID) | INTRAMUSCULAR | Status: DC

## 2011-05-12 MED ORDER — FENTANYL CITRATE 0.05 MG/ML IJ SOLN
INTRAMUSCULAR | Status: AC
  Filled 2011-05-12: qty 2

## 2011-05-12 MED ORDER — ASPIRIN 81 MG PO CHEW: 324.0000 mg | CHEWABLE_TABLET | ORAL | Status: DC

## 2011-05-12 MED ORDER — HEPARIN (PORCINE) IN NACL 2-0.9 UNIT/ML-% IJ SOLN
INTRAMUSCULAR | Status: AC
  Filled 2011-05-12: qty 2000

## 2011-05-12 MED ORDER — HEPARIN SODIUM (PORCINE) 1000 UNIT/ML IJ SOLN
INTRAMUSCULAR | Status: AC
  Filled 2011-05-12: qty 1

## 2011-05-12 MED ORDER — MIDAZOLAM HCL 2 MG/2ML IJ SOLN
INTRAMUSCULAR | Status: AC
  Filled 2011-05-12: qty 2

## 2011-05-12 MED ORDER — NITROGLYCERIN 0.2 MG/ML ON CALL CATH LAB
INTRAVENOUS | Status: AC
  Filled 2011-05-12: qty 1

## 2011-05-12 MED ORDER — SODIUM CHLORIDE 0.9 % IJ SOLN: 3.0000 mL | INTRAMUSCULAR | Status: DC | PRN

## 2011-05-12 MED ORDER — SODIUM CHLORIDE 0.9 % IV SOLN: INTRAVENOUS | Status: AC

## 2011-05-12 MED ORDER — ACETAMINOPHEN 325 MG PO TABS
650.0000 mg | ORAL_TABLET | ORAL | Status: DC | PRN
  Administered 2011-05-12: 650 mg via ORAL
  Filled 2011-05-12: qty 2

## 2011-05-12 NOTE — Interval H&P Note (Signed)
History and Physical Interval Note:  05/12/2011 8:35 AM  [name]  has presented today for cardiac cath  with the diagnosis of chest pain.  The various methods of treatment have been discussed with the patient and family. After consideration of risks, benefits and other options for treatment, the patient has consented to  Procedure(s) (LRB): LEFT HEART CATHETERIZATION WITH CORONARY ANGIOGRAM (N/A) as a surgical intervention .  The patients' history has been reviewed, patient examined, no change in status, stable for surgery.  I have reviewed the patients' chart and labs.  Questions were answered to the patient's satisfaction.     Teona Vargus

## 2011-05-12 NOTE — CV Procedure (Signed)
    Cardiac Catheterization Operative Report  [name] [id number] 4/2/20139:22 AM [name], MD, MD  Procedure Performed:  1. Left Heart Catheterization 2. Selective Coronary Angiography 3. Left ventricular angiogram  Operator: Verne Carrow, MD  Arterial access site:  Right radial artery.   Indication: Chest pain, abnormal stress myoview showing inferior wall scar and possible peri-infarct ischemia.                                     Procedure Details: The risks, benefits, complications, treatment options, and expected outcomes were discussed with the patient. The patient and/or family concurred with the proposed plan, giving informed consent. The patient was brought to the cath lab after IV hydration was begun and oral premedication was given. The patient was further sedated with Versed and Fentanyl. The right wrist was assessed with an Allens test which was positive. The right wrist was prepped and draped in a sterile fashion. 1% lidocaine was used for local anesthesia. Using the modified Seldinger access technique, a 5 French sheath was placed in the right radial artery. 2.5 mg of Nicardipine was given through the sheath. 4000 units IV heparin was given. Standard diagnostic catheters were used to perform selective coronary angiography. A pigtail catheter was used to perform a left ventricular angiogram. The sheath was removed from the right radial artery and a hemostasis band was applied at the arteriotomy site on the right wrist.    There were no immediate complications. The patient was taken to the recovery area in stable condition.   Hemodynamic Findings: Central aortic pressure: 101/50 Left ventricular pressure: 94/11/12  Angiographic Findings:  Left main:  No obstructive disease.  Left Anterior Descending Artery: Large caliber vessel that courses to the apex. 20% stenosis proximal vessel. There are 2 moderate sized diagonal branches that are free of disease.     Circumflex Artery:  Large caliber vessel with 20% proximal stenosis, 30% mid stenosis. The first two obtuse marginal branches are small in caliber and have no disease. The third OM branch is moderate sized and has no disease.   Right Coronary Artery: Large, dominant vessel with mild luminal irregularities in the mid and distal vessel.   Left Ventricular Angiogram: LVEF 35%. There is hypokinesis of the mid inferior wall and inferoapical wall.    Impression: 1. Mild non-obstructive CAD 2. Segmental LV dysfunction without a culprit coronary lesion.   Recommendations: Medical management.        Complications:  None. The patient tolerated the procedure well.

## 2011-05-12 NOTE — H&P (View-Only) (Signed)
[address] [place], [place]  [address] Phone: [phone] Fax:  [phone]  Date:  05/07/2011   Name:  [name]       DOB:  [birth year] MRN:  [id number]  PCP:  Dr. Alberteen Sam Primary Cardiologist:  Dr. Lewayne Bunting  Primary Electrophysiologist:  Dr. Lewayne Bunting    History of Present Illness: [name] is a 78 y.o. female who presents for follow up after an abnormal stress test.  She is a prior patient of Dr. Rudean Haskell.  She has a history of symptomatic bradycardia, status post pacer, persistent atrial fibrillation, hypertension, GERD, depression, diverticulosis, DJD, thyroid goiter.  She is now followed by Dr. Ladona Ridgel.  She was seen in followup 04/15/11 and noted an episode of severe chest pain associated with shortness of breath.    Lexiscan Myoview 04/29/11:  Overall Impression: Small inferior wall infarct from apex to base with mild peri-infarct ischemia. Diffuse hypokinesis with abnormal septal motion.  LV Ejection Fraction: 32%. LV Wall Motion: Diffuse hypokinesis abnormal septal motion EF 32%  Dr. Ladona Ridgel asked the patient to return today to arrange possible cardiac catheterization.  She has symptoms of chest pressure with housework.  She has radiation up into her neck and shoulders.  She has some dyspnea with this as well.  She denies syncope, orthopnea, PND or edema.  She has not used the nitroglycerin that Dr. Ladona Ridgel gave her.  Past Medical History  Diagnosis Date  . HTN (hypertension)   . Hernia     Incisional hernia, umbilicus.  . H/O: hysterectomy   . Bradycardia     s/p pacer (Tenant => GT)  . Atrial fibrillation   . Depression   . Goiter   . DJD (degenerative joint disease)   . GERD (gastroesophageal reflux disease)   . Diverticulosis    Past Surgical History  Procedure Date  . Pacemaker placement , 2002.  [name] Cholecystectomy      Current Outpatient Prescriptions  Medication Sig Dispense Refill  . amLODipine-valsartan (EXFORGE)  10-320 MG per tablet Take 1 tablet by mouth daily.        [name] aspirin 325 MG tablet Take 325 mg by mouth daily.        . carvedilol (COREG) 25 MG tablet Take 25 mg by mouth 2 (two) times daily with a meal.        . ergocalciferol (VITAMIN D2) 50000 UNITS capsule Take 50,000 Units by mouth once a week.        . furosemide (LASIX) 20 MG tablet Take 20 mg by mouth daily. Take 1/2 to 1 tablet every morning for increased swelling      . lansoprazole (PREVACID) 15 MG capsule Take 15 mg by mouth daily.        . nitroGLYCERIN (NITROSTAT) 0.4 MG SL tablet Place 1 tablet (0.4 mg total) under the tongue every 5 (five) minutes as needed for chest pain.  25 tablet  3  . traMADol (ULTRAM) 50 MG tablet Take 100 mg by mouth 4 (four) times daily.       [name] warfarin (COUMADIN) 4 MG tablet Take 1 tablet (4 mg total) by mouth daily.  45 tablet  3    Allergies: No Known Allergies  History  Substance Use Topics  . Smoking status: Former Smoker    Types: Cigarettes  . Smokeless tobacco: Not on file  . Alcohol Use: Not on file     Family History  Problem Relation Age of Onset  .  Coronary artery disease    . Other      stomach carcinoma    ROS:  Please see the history of present illness.   All other systems reviewed and negative.   PHYSICAL EXAM: VS:  BP 102/58  Pulse 63  Ht 5' 10.5" (1.791 m)  Wt 195 lb (88.451 kg)  BMI 27.58 kg/m2 Well nourished, well developed, in no acute distress HEENT: normal Neck: no JVD Vasc: no carotid bruits; no FA bruits bilat Cardiac:  normal S1, S2; RRR; 2/6 holosystolic murmur LLSB Lungs:  clear to auscultation bilaterally, no wheezing, rhonchi or rales Abd: soft, nontender, no hepatomegaly Ext: no edema Skin: warm and dry Neuro:  CNs 2-12 intact, no focal abnormalities noted Psych: normal affect  EKG:  V paced HR 80  ASSESSMENT AND PLAN:  1. Chest pain, unspecified  She has symptoms c/w exertional angina.  Stress test markedly abnormal with low EF.  She has MR  murmur on exam.  D/w Dr. Peter [place] (DOD).  Will arrange cath.  Risks and benefits of cardiac catheterization have been discussed with the patient.  These include bleeding, infection, kidney damage, stroke, heart attack, death.  The patient understands these risks and is willing to proceed.  She knows to use NTG if needed.  She knows to go to the ED if symptoms are worse.  Her BP is too soft to add Isosorbide.  Add ASA 81 mg QD.   2. Atrial fibrillation  No h/o stroke.  Hold coumadin starting today and proceed with cath on Monday 05/11/11.   3. Murmur  Sounds like MR.  Will obtain echo as well.   4. Essential hypertension, benign  Controlled.  Continue current therapy.           [name], PA-C  8:56 AM 05/07/2011

## 2011-05-12 NOTE — Discharge Instructions (Signed)
Pt can resume coumadin today. She should follow up with Dr. Ladona Ridgel in 2-3 weeks. Thanks.    Radial Site Care Refer to this sheet in the next few weeks. These instructions provide you with information on caring for yourself after your procedure. Your caregiver may also give you more specific instructions. Your treatment has been planned according to current medical practices, but problems sometimes occur. Call your caregiver if you have any problems or questions after your procedure. HOME CARE INSTRUCTIONS  You may shower the day after the procedure.Remove the bandage (dressing) and gently wash the site with plain soap and water.Gently pat the site dry.   Do not apply powder or lotion to the site.   Do not submerge the affected site in water for 3 to 5 days.   Inspect the site at least twice daily.   Do not flex or bend the affected arm for 24 hours.   No lifting over 5 pounds (2.3 kg) for 5 days after your procedure.   Do not drive home if you are discharged the same day of the procedure. Have someone else drive you.   You may drive 24 hours after the procedure unless otherwise instructed by your caregiver.   Do not operate machinery or power tools for 24 hours.   A responsible adult should be with you for the first 24 hours after you arrive home.  What to expect:  Any bruising will usually fade within 1 to 2 weeks.   Blood that collects in the tissue (hematoma) may be painful to the touch. It should usually decrease in size and tenderness within 1 to 2 weeks.  SEEK IMMEDIATE MEDICAL CARE IF:  You have unusual pain at the radial site.   You have redness, warmth, swelling, or pain at the radial site.   You have drainage (other than a small amount of blood on the dressing).   You have chills.   You have a fever or persistent symptoms for more than 72 hours.   You have a fever and your symptoms suddenly get worse.   Your arm becomes pale, cool, tingly, or numb.   You  have heavy bleeding from the site. Hold pressure on the site.  Document Released: 02/28/2010 Document Revised: 01/15/2011 Document Reviewed: 02/28/2010 [facility] Patient Information 2012 [place], [place].

## 2011-05-12 NOTE — Progress Notes (Signed)
Right radial site unremarkable. Positive reverse allen's test. D/C instructions reviewed with pt and family, verbalized understanding.

## 2011-05-13 ENCOUNTER — Other Ambulatory Visit (HOSPITAL_COMMUNITY): Payer: Medicare Other

## 2011-05-14 MED FILL — Nicardipine HCl IV Soln 2.5 MG/ML: INTRAVENOUS | Qty: 1 | Status: AC

## 2011-05-25 ENCOUNTER — Encounter: Payer: Self-pay | Admitting: Physician Assistant

## 2011-05-25 ENCOUNTER — Ambulatory Visit (HOSPITAL_COMMUNITY): Payer: Medicare Other | Attending: Physician Assistant

## 2011-05-25 ENCOUNTER — Ambulatory Visit: Payer: Medicare Other | Admitting: Physician Assistant

## 2011-05-25 ENCOUNTER — Ambulatory Visit (INDEPENDENT_AMBULATORY_CARE_PROVIDER_SITE_OTHER): Payer: Medicare Other | Admitting: Physician Assistant

## 2011-05-25 ENCOUNTER — Ambulatory Visit (INDEPENDENT_AMBULATORY_CARE_PROVIDER_SITE_OTHER): Payer: Medicare Other | Admitting: Pharmacist

## 2011-05-25 VITALS — BP 112/70 | HR 80 | Ht 70.5 in | Wt 197.8 lb

## 2011-05-25 DIAGNOSIS — I059 Rheumatic mitral valve disease, unspecified: Secondary | ICD-10-CM | POA: Insufficient documentation

## 2011-05-25 DIAGNOSIS — I4891 Unspecified atrial fibrillation: Secondary | ICD-10-CM

## 2011-05-25 DIAGNOSIS — Z7901 Long term (current) use of anticoagulants: Secondary | ICD-10-CM

## 2011-05-25 DIAGNOSIS — R0609 Other forms of dyspnea: Secondary | ICD-10-CM | POA: Insufficient documentation

## 2011-05-25 DIAGNOSIS — I429 Cardiomyopathy, unspecified: Secondary | ICD-10-CM | POA: Insufficient documentation

## 2011-05-25 DIAGNOSIS — I517 Cardiomegaly: Secondary | ICD-10-CM | POA: Insufficient documentation

## 2011-05-25 DIAGNOSIS — R0989 Other specified symptoms and signs involving the circulatory and respiratory systems: Secondary | ICD-10-CM | POA: Insufficient documentation

## 2011-05-25 DIAGNOSIS — R079 Chest pain, unspecified: Secondary | ICD-10-CM | POA: Insufficient documentation

## 2011-05-25 DIAGNOSIS — I2510 Atherosclerotic heart disease of native coronary artery without angina pectoris: Secondary | ICD-10-CM | POA: Insufficient documentation

## 2011-05-25 DIAGNOSIS — I10 Essential (primary) hypertension: Secondary | ICD-10-CM | POA: Insufficient documentation

## 2011-05-25 DIAGNOSIS — R011 Cardiac murmur, unspecified: Secondary | ICD-10-CM | POA: Insufficient documentation

## 2011-05-25 LAB — BASIC METABOLIC PANEL
CO2: 28 mEq/L (ref 19–32)
Calcium: 9.7 mg/dL (ref 8.4–10.5)
Glucose, Bld: 80 mg/dL (ref 70–99)
Potassium: 3.4 mEq/L — ABNORMAL LOW (ref 3.5–5.1)
Sodium: 139 mEq/L (ref 135–145)

## 2011-05-25 LAB — POCT INR: INR: 2

## 2011-05-25 MED ORDER — ATORVASTATIN CALCIUM 20 MG PO TABS: 20.0000 mg | ORAL_TABLET | Freq: Every day | ORAL | Status: DC

## 2011-05-25 NOTE — Progress Notes (Signed)
[address] [place], [place]  [address] Phone: [phone] Fax:  [phone]  Date:  05/25/2011   Name:  [name]       DOB:  21-Dec-1932 MRN:  [id number]  PCP:  Dr. Alberteen Sam Primary Cardiologist:  Dr. Lewayne Bunting  Primary Electrophysiologist:  Dr. Lewayne Bunting    History of Present Illness: [name] is a 78 y.o. female who presents for follow up after cardiac cath.  She is a prior patient of Dr. Deborah Chalk.  She has a history of symptomatic bradycardia, status post pacer, persistent atrial fibrillation, hypertension, GERD, depression, diverticulosis, DJD, thyroid goiter.  She is now followed by Dr. Ladona Ridgel.  She was seen in followup 04/15/11 and noted an episode of severe chest pain associated with shortness of breath.    Lexiscan Myoview 04/29/11:  Overall Impression: Small inferior wall infarct from apex to base with mild peri-infarct ischemia. Diffuse hypokinesis with abnormal septal motion.  LV Ejection Fraction: 32%. LV Wall Motion: Diffuse hypokinesis abnormal septal motion EF 32%  I saw her 3/28 and arranged LHC.  Left Heart Catheterization 05/12/11: Angiographic Findings:  Left main: No obstructive disease.  Left Anterior Descending Artery: Large caliber vessel that courses to the apex. 20% stenosis proximal vessel. There are 2 moderate sized diagonal branches that are free of disease.  Circumflex Artery: Large caliber vessel with 20% proximal stenosis, 30% mid stenosis. The first two obtuse marginal branches are small in caliber and have no disease. The third OM branch is moderate sized and has no disease.  Right Coronary Artery: Large, dominant vessel with mild luminal irregularities in the mid and distal vessel.  Left Ventricular Angiogram: LVEF 35%. There is hypokinesis of the mid inferior wall and inferoapical wall.  Impression:  1. Mild non-obstructive CAD  2. Segmental LV dysfunction without a culprit coronary lesion.  Recommendations:  Medical management.   She returns for follow up today.  She underwent an echo this am.  She denies chest pain, syncope, PND.  She sleeps on 2 pillows chronically.  No changes.  She has some LE edema that is stable without change.  She notes chronic DOE, class 2b.  No changes.    Past Medical History  Diagnosis Date  . HTN (hypertension)   . Hernia     Incisional hernia, umbilicus.  . H/O: hysterectomy   . Bradycardia     s/p pacer (Tenant => GT)  . Atrial fibrillation   . Depression   . Goiter   . DJD (degenerative joint disease)   . GERD (gastroesophageal reflux disease)   . Diverticulosis   . Cardiomyopathy secondary     Myoview 3/13: small inf scar apex to base with mild peri-infarct isch; EF 32%;   LHC 05/12/11: pLAD 20%, pCFX 20%, mid 30%, m+dRCA with mild luminal irregs; EF 35% with mid inf wal and inf-apical HK(no culprit for segmental WMA)  . CAD (coronary artery disease)     nonobs by [facility] 4/13    Past Surgical History  Procedure Date  . Pacemaker placement , 2002.  [name] Cholecystectomy      Current Outpatient Prescriptions  Medication Sig Dispense Refill  . Amlodipine-Valsartan-HCTZ (EXFORGE HCT) 10-320-25 MG TABS Take 1 tablet by mouth daily.      . carvedilol (COREG) 25 MG tablet Take 25 mg by mouth 2 (two) times daily with a meal.        . ergocalciferol (VITAMIN D2) 50000 UNITS capsule Take 50,000 Units by mouth once  a week.        . furosemide (LASIX) 40 MG tablet Take 40 mg by mouth daily. Take 1/2 to 1 tablet every morning for increased swelling      . lansoprazole (PREVACID) 15 MG capsule Take 15 mg by mouth daily.        . nitroGLYCERIN (NITROSTAT) 0.4 MG SL tablet Place 1 tablet (0.4 mg total) under the tongue every 5 (five) minutes as needed for chest pain.  25 tablet  3  . traMADol (ULTRAM) 50 MG tablet Take 100 mg by mouth 4 (four) times daily.       [name] warfarin (COUMADIN) 4 MG tablet Take 2-4 mg by mouth daily. Patient takes 1 tablet (4mg ) daily except on  Fridays she takes 1/2 tablet (2mg )      . DISCONTD: aspirin 325 MG tablet Take 325 mg by mouth daily.        [name] aspirin EC 81 MG tablet Take 1 tablet (81 mg total) by mouth daily.      [name] atorvastatin (LIPITOR) 20 MG tablet Take 1 tablet (20 mg total) by mouth daily.  30 tablet  11    Allergies: No Known Allergies  History  Substance Use Topics  . Smoking status: Former Smoker    Types: Cigarettes  . Smokeless tobacco: Not on file  . Alcohol Use: Not on file     PHYSICAL EXAM: VS:  BP 112/70  Pulse 80  Ht 5' 10.5" (1.791 m)  Wt 197 lb 12.8 oz (89.721 kg)  BMI 27.98 kg/m2 Well nourished, well developed, in no acute distress HEENT: normal Neck: no JVD Cardiac:  normal S1, S2; RRR; 2/6 holosystolic murmur LLSB Lungs:  clear to auscultation bilaterally, no wheezing, rhonchi or rales Abd: soft, nontender, no hepatomegaly Ext: no edema; right wrist without hematoma or bruit Skin: warm and dry Neuro:  CNs 2-12 intact, no focal abnormalities noted Psych: normal affect  EKG:   V paced, HR 80  ASSESSMENT AND PLAN:  1.  Cardiomyopathy ? Etiology.  She had no culprit lesion on LHC.  ? Vasospasm vs transient plaque rupture.  I had a long d/w the patient and her husband regarding all this today.  She is already on amlodipine and her BP is good.  I do not think that adding nitrates is indicated.  Will start statin Rx and continue ASA for now.  Add Lipitor 20 mg QD.  Check L/L in 6-8 weeks.  Follow up with Dr. Lewayne Bunting in 6-8 weeks.  Echo pending today.  2.  CAD, non-obstructive Continue ASA, statin.  Follow up with Dr. Lewayne Bunting in 6-8 weeks.   3.  Atrial Fibrillation Continue warfarin.  4.  HTN Repeat BMET today.   [name], PA-C  12:07 PM 05/25/2011

## 2011-05-25 NOTE — Patient Instructions (Signed)
Your physician recommends that you schedule a follow-up appointment in: 6-8 WEEKS WITH DR. Ladona Ridgel, PT TO HAVE FASTING LIPID/LIVER PANEL SAME DAY AS APPT.  Your physician recommends that you return for lab work in: TODAY BMET 414.01  Your physician has recommended you make the following change in your medication: DECREASE ASPIRIN TO 81 MG DAILY; START ATORVASTATIN (GENERIC LIPITOR) 20 MG EVERY EVENING

## 2011-05-27 ENCOUNTER — Telehealth: Payer: Self-pay | Admitting: *Deleted

## 2011-05-27 DIAGNOSIS — I10 Essential (primary) hypertension: Secondary | ICD-10-CM

## 2011-05-27 MED ORDER — AMLODIPINE BESYLATE-VALSARTAN 10-320 MG PO TABS: 1.0000 | ORAL_TABLET | Freq: Every day | ORAL | Status: DC

## 2011-05-27 NOTE — Telephone Encounter (Signed)
Pt notified of lab results and med changes, new rx sent in today to [facility] for Exforge 10/320. Pt gave me verbal read back and understanding today

## 2011-05-27 NOTE — Telephone Encounter (Signed)
Message copied by Tarri Fuller on Wed May 27, 2011 10:33 AM ------      Message from: [place], [place] T      Created: Tue May 26, 2011  8:14 AM       Creatinine up and K+ low      She takes Exforge-HCT and Lasix (2 diuretics)      Have her d/c Exforge-HCT and start on Exforge 10/320 mg (no HCTZ)      Increase dietary K+ for 1-2 days      Repeat BMET in one week      Weigh daily and call if:  Weight up 3 lbs in one day, increased swelling or increased dyspnea.       Tereso Newcomer, PA-C  8:14 AM 05/26/2011

## 2011-06-04 ENCOUNTER — Other Ambulatory Visit (INDEPENDENT_AMBULATORY_CARE_PROVIDER_SITE_OTHER): Payer: Medicare Other

## 2011-06-04 DIAGNOSIS — I10 Essential (primary) hypertension: Secondary | ICD-10-CM

## 2011-06-04 LAB — BASIC METABOLIC PANEL
BUN: 19 mg/dL (ref 6–23)
CO2: 29 mEq/L (ref 19–32)
Chloride: 105 mEq/L (ref 96–112)
Potassium: 3.9 mEq/L (ref 3.5–5.1)

## 2011-06-15 ENCOUNTER — Ambulatory Visit (INDEPENDENT_AMBULATORY_CARE_PROVIDER_SITE_OTHER): Payer: Medicare Other | Admitting: Pharmacist

## 2011-06-15 DIAGNOSIS — Z7901 Long term (current) use of anticoagulants: Secondary | ICD-10-CM

## 2011-06-15 DIAGNOSIS — I4891 Unspecified atrial fibrillation: Secondary | ICD-10-CM

## 2011-06-15 LAB — POCT INR: INR: 3.6

## 2011-07-09 ENCOUNTER — Ambulatory Visit (INDEPENDENT_AMBULATORY_CARE_PROVIDER_SITE_OTHER): Payer: Medicare Other

## 2011-07-09 DIAGNOSIS — I4891 Unspecified atrial fibrillation: Secondary | ICD-10-CM

## 2011-07-09 DIAGNOSIS — Z7901 Long term (current) use of anticoagulants: Secondary | ICD-10-CM

## 2011-07-09 LAB — POCT INR: INR: 2.6

## 2011-07-15 ENCOUNTER — Encounter: Payer: Self-pay | Admitting: Internal Medicine

## 2011-07-15 ENCOUNTER — Ambulatory Visit (INDEPENDENT_AMBULATORY_CARE_PROVIDER_SITE_OTHER): Payer: Medicare Other | Admitting: Cardiology

## 2011-07-15 ENCOUNTER — Other Ambulatory Visit (INDEPENDENT_AMBULATORY_CARE_PROVIDER_SITE_OTHER): Payer: Medicare Other

## 2011-07-15 ENCOUNTER — Telehealth: Payer: Self-pay | Admitting: *Deleted

## 2011-07-15 ENCOUNTER — Encounter: Payer: Self-pay | Admitting: Cardiology

## 2011-07-15 VITALS — BP 119/83 | HR 80 | Ht 71.0 in | Wt 212.4 lb

## 2011-07-15 DIAGNOSIS — I428 Other cardiomyopathies: Secondary | ICD-10-CM | POA: Insufficient documentation

## 2011-07-15 DIAGNOSIS — I2510 Atherosclerotic heart disease of native coronary artery without angina pectoris: Secondary | ICD-10-CM

## 2011-07-15 DIAGNOSIS — I4891 Unspecified atrial fibrillation: Secondary | ICD-10-CM

## 2011-07-15 DIAGNOSIS — Z950 Presence of cardiac pacemaker: Secondary | ICD-10-CM

## 2011-07-15 DIAGNOSIS — I498 Other specified cardiac arrhythmias: Secondary | ICD-10-CM

## 2011-07-15 LAB — PACEMAKER DEVICE OBSERVATION
AL IMPEDENCE PM: 400 Ohm
BAMS-0001: 150 {beats}/min
BAMS-0003: 80 {beats}/min
DEVICE MODEL PM: 7203906
RV LEAD AMPLITUDE: 9 mv
RV LEAD IMPEDENCE PM: 450 Ohm
RV LEAD THRESHOLD: 1.25 V

## 2011-07-15 LAB — HEPATIC FUNCTION PANEL
ALT: 14 U/L (ref 0–35)
AST: 20 U/L (ref 0–37)
Albumin: 3.8 g/dL (ref 3.5–5.2)

## 2011-07-15 LAB — LIPID PANEL
HDL: 56.8 mg/dL (ref >39.00)
Total CHOL/HDL Ratio: 2
Triglycerides: 44 mg/dL (ref 0.0–149.0)
VLDL: 8.8 mg/dL (ref 0.0–40.0)

## 2011-07-15 NOTE — Telephone Encounter (Signed)
Message copied by Burnell Blanks on Wed Jul 15, 2011  2:08 PM ------      Message from: [place], [place] T      Created: Wed Jul 15, 2011  1:51 PM       Please notify patient that the lab results are ok.      Tereso Newcomer, PA-C  1:50 PM 07/15/2011

## 2011-07-15 NOTE — Progress Notes (Signed)
ELECTROPHYSIOLOGY OFFICE NOTE  Patient ID: [name] MRN: [id number], DOB/AGE: [birth year]   Date of Visit: 07/15/2011  Primary Physician: [name], MD, MD Primary Cardiologist: Lewayne Bunting, MD Reason for Visit: Follow-up after recent medication changes  History of Present Illness [name] is a pleasant 78 year old woman with a recently diagnosed nonischemic cardiomyopathy, EF 35%, atrial fibrillation, non-obstructive CAD s/p LHC 05/2011 and symptomatic bradycardia s/p PPM who presents today for follow-up after recent medication changes. She has taken Exforge in the past and at her last visit with Tereso Newcomer, PA-C this medication was restarted in the setting of new LV dysfunction. [name] reports she is feeling "about the same." She continues to have intermittent DOE. She denies chest pain, palpitations, dizziness, near-syncope or syncope. She has chronic lower extremity swelling which she tells me is better in the mornings. She denies abdominal swelling, orthopnea or PND. She reports compliance with her medications. She reports some indiscretion with her salt intake and does not weigh herself daily.  Past Medical History  Diagnosis Date  . HTN (hypertension)   . Hernia     Incisional hernia, umbilicus.  . H/O: hysterectomy   . Bradycardia     s/p pacer (Tenant => GT)  . Atrial fibrillation   . Depression   . Goiter   . DJD (degenerative joint disease)   . GERD (gastroesophageal reflux disease)   . Diverticulosis   . Cardiomyopathy secondary     Myoview 3/13: small inf scar apex to base with mild peri-infarct isch; EF 32%;   LHC 05/12/11: pLAD 20%, pCFX 20%, mid 30%, m+dRCA with mild luminal irregs; EF 35% with mid inf wal and inf-apical HK(no culprit for segmental WMA)  . CAD (coronary artery disease)     nonobs by [facility] 4/13    Past Surgical History  Procedure Date  . Pacemaker placement , 2002.  [name] Cholecystectomy     Allergies/Intolerances No Known  Allergies  Current Home Medications Current Outpatient Prescriptions  Medication Sig Dispense Refill  . amLODipine-valsartan (EXFORGE) 10-320 MG per tablet Take 1 tablet by mouth daily.  30 tablet  11  . aspirin EC 81 MG tablet Take 1 tablet (81 mg total) by mouth daily.      [name] atorvastatin (LIPITOR) 20 MG tablet Take 1 tablet (20 mg total) by mouth daily.  30 tablet  11  . carvedilol (COREG) 25 MG tablet Take 25 mg by mouth 2 (two) times daily with a meal.        . ergocalciferol (VITAMIN D2) 50000 UNITS capsule Take 50,000 Units by mouth once a week.        . furosemide (LASIX) 40 MG tablet Take 40 mg by mouth daily. Take 1/2 to 1 tablet every morning for increased swelling      . lansoprazole (PREVACID) 15 MG capsule Take 15 mg by mouth daily.        . nitroGLYCERIN (NITROSTAT) 0.4 MG SL tablet Place 1 tablet (0.4 mg total) under the tongue every 5 (five) minutes as needed for chest pain.  25 tablet  3  . traMADol (ULTRAM) 50 MG tablet Take 100 mg by mouth 4 (four) times daily.       [name] warfarin (COUMADIN) 4 MG tablet Take 2-4 mg by mouth daily. Patient takes 1 tablet (4mg ) daily except on Fridays she takes 1/2 tablet (2mg )       Social History Social History  . Marital Status: Married    Spouse Name:  N/A    Number of Children: N/A  . Years of Education: N/A   Social History Main Topics  . Smoking status: Former Smoker    Types: Cigarettes  . Smokeless tobacco: Not on file  . Alcohol Use: Not on file  . Drug Use: Not on file  . Sexually Active: Not on file   Social History Narrative    Married, lives with her husband in a one-level home, three steps to enter, moved from [place] three years ago due to her husband's retirement and his local roots being from [place].    Review of Systems General:  No chills, fever, night sweats or weight changes Cardiovascular:  No chest pain, dyspnea on exertion, edema, orthopnea, palpitations, paroxysmal nocturnal dyspnea Dermatological:  No rash, lesions or masses Respiratory: No cough, dyspnea Urologic: No hematuria, dysuria Abdominal:   No nausea, vomiting, diarrhea, bright red blood per rectum, melena, or hematemesis Neurologic:  No visual changes, weakness, changes in mental status All other systems reviewed and are otherwise negative except as noted above.  Physical Exam Blood pressure 119/83, pulse 80, height 5\' 11"  (1.803 m), weight 212 lb 6.4 oz (96.344 kg).  General: Well developed, well appearing 78 year old female, in no acute distress. HEENT: Normocephalic, atraumatic. EOMs intact. Sclera nonicteric. Oropharynx clear.  Neck: Supple without bruits. No JVD. Lungs:  Respirations regular and unlabored, CTA bilaterally. No wheezes, rales or rhonchi. Heart: Regular S1, S2 present. No murmurs, rub, S3 or S4. Abdomen: Soft, non-distended.  Extremities: No clubbing, cyanosis or edema. DP/PT/Radials 2+ and equal bilaterally. Psych: Normal affect. Neuro: Alert and oriented X 3. Moves all extremities spontaneously.   Diagnostics/Studies 12-lead ECG shows V pacing at 80 bpm Device interrogation today shows normal PPM function with good battery status and stable lead parameters; she has been in AF since Dec 2012; programming changes made - DDDR >> DDIR; see PaceArt report  Assessment and Plan 1. Recently diagnosed NICM - patient stable and appears euvolemic by exam today; continue medical therapy with carvedilol and valsartan; continue low sodium (< 2 grams per day), fluid restricted diet; she will start weighing herself daily; continue furosemide as previously directed by Dr. Ladona Ridgel 2. Atrial fibrillation - in AF since Dec 2012; difficult to determine how much of this may be contributing to her symptoms; may be worthwhile to attempt to restore NSR which was discussed with her briefly today; will reassess LVEF and make further recs 3. Bradycardia s/p PPM - device function normal; ? chronic RV pacing contributing to her NICM;  will repeat echo in 3 months and arrange follow-up with Dr. Ladona Ridgel  This plan of care was formulated with Dr. Lewayne Bunting who was in to the see the patient with me. Signed, Rick Duff, PA-C 07/15/2011, 10:42 AM

## 2011-07-15 NOTE — Telephone Encounter (Signed)
Unable to leave message.  Mailed copy and highlighted Scott's comments, call if any questions

## 2011-07-15 NOTE — Patient Instructions (Addendum)
Your physician recommends that you schedule a follow-up appointment in: 3 months with Dr Ladona Ridgel with echocardiogram ahead of time.

## 2011-07-16 ENCOUNTER — Encounter: Payer: Medicare Other | Admitting: *Deleted

## 2011-07-17 ENCOUNTER — Other Ambulatory Visit: Payer: Self-pay | Admitting: Internal Medicine

## 2011-07-17 MED ORDER — FUROSEMIDE 40 MG PO TABS: 40.0000 mg | ORAL_TABLET | Freq: Every day | ORAL | Status: DC

## 2011-07-20 ENCOUNTER — Encounter: Payer: Self-pay | Admitting: *Deleted

## 2011-07-22 ENCOUNTER — Other Ambulatory Visit: Payer: Self-pay | Admitting: *Deleted

## 2011-07-22 MED ORDER — WARFARIN SODIUM 4 MG PO TABS: ORAL_TABLET | ORAL | Status: DC

## 2011-08-06 ENCOUNTER — Ambulatory Visit (INDEPENDENT_AMBULATORY_CARE_PROVIDER_SITE_OTHER): Payer: Medicare Other

## 2011-08-06 ENCOUNTER — Other Ambulatory Visit (HOSPITAL_COMMUNITY): Payer: Medicare Other

## 2011-08-06 DIAGNOSIS — I4891 Unspecified atrial fibrillation: Secondary | ICD-10-CM

## 2011-08-06 DIAGNOSIS — Z7901 Long term (current) use of anticoagulants: Secondary | ICD-10-CM

## 2011-09-03 ENCOUNTER — Ambulatory Visit (INDEPENDENT_AMBULATORY_CARE_PROVIDER_SITE_OTHER): Payer: Medicare Other | Admitting: *Deleted

## 2011-09-03 DIAGNOSIS — I4891 Unspecified atrial fibrillation: Secondary | ICD-10-CM

## 2011-09-03 DIAGNOSIS — Z7901 Long term (current) use of anticoagulants: Secondary | ICD-10-CM

## 2011-10-14 ENCOUNTER — Ambulatory Visit (INDEPENDENT_AMBULATORY_CARE_PROVIDER_SITE_OTHER): Payer: Medicare Other | Admitting: Internal Medicine

## 2011-10-14 ENCOUNTER — Encounter: Payer: Self-pay | Admitting: Internal Medicine

## 2011-10-14 ENCOUNTER — Ambulatory Visit (HOSPITAL_COMMUNITY): Payer: Medicare Other | Attending: Internal Medicine | Admitting: Radiology

## 2011-10-14 VITALS — BP 108/78 | HR 70 | Ht 71.0 in | Wt 207.6 lb

## 2011-10-14 DIAGNOSIS — I4891 Unspecified atrial fibrillation: Secondary | ICD-10-CM

## 2011-10-14 DIAGNOSIS — I379 Nonrheumatic pulmonary valve disorder, unspecified: Secondary | ICD-10-CM | POA: Insufficient documentation

## 2011-10-14 DIAGNOSIS — I428 Other cardiomyopathies: Secondary | ICD-10-CM

## 2011-10-14 DIAGNOSIS — I059 Rheumatic mitral valve disease, unspecified: Secondary | ICD-10-CM | POA: Insufficient documentation

## 2011-10-14 DIAGNOSIS — I429 Cardiomyopathy, unspecified: Secondary | ICD-10-CM

## 2011-10-14 DIAGNOSIS — I10 Essential (primary) hypertension: Secondary | ICD-10-CM | POA: Insufficient documentation

## 2011-10-14 DIAGNOSIS — Z950 Presence of cardiac pacemaker: Secondary | ICD-10-CM

## 2011-10-14 DIAGNOSIS — I5022 Chronic systolic (congestive) heart failure: Secondary | ICD-10-CM

## 2011-10-14 DIAGNOSIS — I495 Sick sinus syndrome: Secondary | ICD-10-CM

## 2011-10-14 DIAGNOSIS — I079 Rheumatic tricuspid valve disease, unspecified: Secondary | ICD-10-CM | POA: Insufficient documentation

## 2011-10-14 DIAGNOSIS — I2510 Atherosclerotic heart disease of native coronary artery without angina pectoris: Secondary | ICD-10-CM | POA: Insufficient documentation

## 2011-10-14 LAB — PACEMAKER DEVICE OBSERVATION
AL IMPEDENCE PM: 362.5 Ohm
ATRIAL PACING PM: 3.4
BATTERY VOLTAGE: 2.9629 V
RV LEAD IMPEDENCE PM: 425 Ohm
VENTRICULAR PACING PM: 97

## 2011-10-14 NOTE — Progress Notes (Signed)
Echocardiogram performed.  

## 2011-10-14 NOTE — Patient Instructions (Addendum)
Your physician wants you to follow-up in: 12 months with Dr. Taylor. You will receive a reminder letter in the mail two months in advance. If you don't receive a letter, please call our office to schedule the follow-up appointment.    

## 2011-10-21 ENCOUNTER — Encounter: Payer: Self-pay | Admitting: Internal Medicine

## 2011-10-21 DIAGNOSIS — I5022 Chronic systolic (congestive) heart failure: Secondary | ICD-10-CM | POA: Insufficient documentation

## 2011-10-21 NOTE — Assessment & Plan Note (Signed)
Her device is working normally. Her underlying rhythm is less than 30 beats per minute. She is pacing in the ventricle daily 100% of the time. We'll plan to recheck her [employer] Jude dual-chamber pacemaker in several months.

## 2011-10-21 NOTE — Assessment & Plan Note (Signed)
She is now in atrial fibrillation also exclusively. Her ventricular rates are well controlled. She will continue her current medical therapy including warfarin.

## 2011-10-21 NOTE — Assessment & Plan Note (Signed)
Her symptoms are currently class 1-2. If they worsen, she would be a candidate for upgrade from a dual-chamber pacemaker to a biventricular pacemaker or ICD. She will call us if her shortness of breath were to worsen. For now, she will continue her current medical therapy and maintain a low-sodium diet.

## 2011-10-21 NOTE — Progress Notes (Signed)
HPI [name] returns today for followup. She is a very pleasant 78 year old woman with complete heart block, nonobstructive coronary disease, segmental wall motion abnormalities by nuclear stress testing, and moderate left ventricular dysfunction with an ejection fraction of approximately 35%. In the interim, she has done well. She denies chest pain or shortness of breath. She remains active. No syncope. No Known Allergies   Current Outpatient Prescriptions  Medication Sig Dispense Refill  . amLODipine-valsartan (EXFORGE) 10-320 MG per tablet Take 1 tablet by mouth daily.  30 tablet  11  . aspirin EC 81 MG tablet Take 1 tablet (81 mg total) by mouth daily.      [name] atorvastatin (LIPITOR) 20 MG tablet Take 1 tablet (20 mg total) by mouth daily.  30 tablet  11  . carvedilol (COREG) 25 MG tablet Take 25 mg by mouth 2 (two) times daily with a meal.        . ergocalciferol (VITAMIN D2) 50000 UNITS capsule Take 50,000 Units by mouth once a week.        . furosemide (LASIX) 40 MG tablet Take 1 tablet (40 mg total) by mouth daily. Take 1/2 to 1 tablet every morning for increased swelling  30 tablet  3  . lansoprazole (PREVACID) 15 MG capsule Take 15 mg by mouth daily.        . nitroGLYCERIN (NITROSTAT) 0.4 MG SL tablet Place 1 tablet (0.4 mg total) under the tongue every 5 (five) minutes as needed for chest pain.  25 tablet  3  . traMADol (ULTRAM) 50 MG tablet Take 100 mg by mouth 4 (four) times daily.       [name] warfarin (COUMADIN) 4 MG tablet Take as directed by coumadin clinic  45 tablet  3     Past Medical History  Diagnosis Date  . HTN (hypertension)   . Hernia     Incisional hernia, umbilicus.  . H/O: hysterectomy   . Bradycardia     s/p pacer ([facility] => GT)  . Atrial fibrillation   . Depression   . Goiter   . DJD (degenerative joint disease)   . GERD (gastroesophageal reflux disease)   . Diverticulosis   . Cardiomyopathy secondary     Myoview 3/13: small inf scar apex to base with  mild peri-infarct isch; EF 32%;   LHC 05/12/11: pLAD 20%, pCFX 20%, mid 30%, m+dRCA with mild luminal irregs; EF 35% with mid inf wal and inf-apical HK(no culprit for segmental WMA)  . CAD (coronary artery disease)     nonobs by [facility] 4/13    ROS:   All systems reviewed and negative except as noted in the HPI.   Past Surgical History  Procedure Date  . Pacemaker placement , 2002.  [name] Cholecystectomy      Family History  Problem Relation Age of Onset  . Coronary artery disease    . Other      stomach carcinoma     History   Social History  . Marital Status: Married    Spouse Name: N/A    Number of Children: N/A  . Years of Education: N/A   Occupational History  . Not on file.   Social History Main Topics  . Smoking status: Former Smoker    Types: Cigarettes  . Smokeless tobacco: Not on file  . Alcohol Use: Not on file  . Drug Use: Not on file  . Sexually Active: Not on file   Other Topics Concern  . Not on file  Social History Narrative    Married, lives with her husband in a one-level home, three steps to enter, moved from [place] three years ago due to her husband's retirement and his local roots being from [place].     BP 108/78  Pulse 70  Ht 5\' 11"  (1.803 m)  Wt 207 lb 9.6 oz (94.167 kg)  BMI 28.95 kg/m2  Physical Exam:  Well appearing 78 year old woman, NAD HEENT: Unremarkable Neck:  No JVD, no thyromegally Lungs:  Clear with no wheezes, rales, or rhonchi. HEART:  Regular rate rhythm, no murmurs, no rubs, no clicks Abd:  soft, positive bowel sounds, no organomegally, no rebound, no guarding Ext:  2 plus pulses, no edema, no cyanosis, no clubbing Skin:  No rashes no nodules Neuro:  CN II through XII intact, motor grossly intact  DEVICE  Normal device function.  See PaceArt for details.   Assess/Plan:

## 2011-11-02 ENCOUNTER — Telehealth: Payer: Self-pay | Admitting: Internal Medicine

## 2011-11-02 NOTE — Telephone Encounter (Signed)
Will arrange for an appointment in Nov with Dr Ladona Ridgel per his message  [facility] aware

## 2011-11-02 NOTE — Telephone Encounter (Signed)
Pt returning nurse call, she can be reached at hm#  °

## 2011-11-05 ENCOUNTER — Other Ambulatory Visit (HOSPITAL_COMMUNITY): Payer: Medicare Other

## 2011-12-09 ENCOUNTER — Encounter: Payer: Self-pay | Admitting: *Deleted

## 2011-12-16 ENCOUNTER — Encounter: Payer: Self-pay | Admitting: Internal Medicine

## 2011-12-16 ENCOUNTER — Ambulatory Visit (INDEPENDENT_AMBULATORY_CARE_PROVIDER_SITE_OTHER): Payer: Medicare Other | Admitting: Internal Medicine

## 2011-12-16 VITALS — BP 107/68 | HR 85 | Ht 69.0 in | Wt 203.0 lb

## 2011-12-16 DIAGNOSIS — I4891 Unspecified atrial fibrillation: Secondary | ICD-10-CM

## 2011-12-16 DIAGNOSIS — I429 Cardiomyopathy, unspecified: Secondary | ICD-10-CM

## 2011-12-16 LAB — PACEMAKER DEVICE OBSERVATION
AL IMPEDENCE PM: 362.5 Ohm
ATRIAL PACING PM: 3.2
BAMS-0001: 150 {beats}/min
RV LEAD IMPEDENCE PM: 425 Ohm
RV LEAD THRESHOLD: 1.25 V

## 2011-12-16 NOTE — Progress Notes (Signed)
HPI [name] returns today for followup. She is a very pleasant 78 year old woman with chronic atrial fibrillation, symptomatic tachycardia bradycardia syndrome, hypertension, and left ventricular dysfunction. When I saw the patient last, she was doing quite well. No chest pain or shortness of breath. In the interim, she notes class 2 CHF symptoms. Her husband who is with her today notes mild fatigue and reduced activity. No peripheral edema. No Known Allergies   Current Outpatient Prescriptions  Medication Sig Dispense Refill  . amLODipine-valsartan (EXFORGE) 10-320 MG per tablet Take 1 tablet by mouth daily.  30 tablet  11  . aspirin EC 81 MG tablet Take 1 tablet (81 mg total) by mouth daily.      [name] atorvastatin (LIPITOR) 20 MG tablet Take 1 tablet (20 mg total) by mouth daily.  30 tablet  11  . carvedilol (COREG) 25 MG tablet Take 25 mg by mouth 2 (two) times daily with a meal.        . ergocalciferol (VITAMIN D2) 50000 UNITS capsule Take 50,000 Units by mouth once a week.        . furosemide (LASIX) 40 MG tablet Take 1 tablet (40 mg total) by mouth daily. Take 1/2 to 1 tablet every morning for increased swelling  30 tablet  3  . lansoprazole (PREVACID) 15 MG capsule Take 15 mg by mouth daily.        . nitroGLYCERIN (NITROSTAT) 0.4 MG SL tablet Place 1 tablet (0.4 mg total) under the tongue every 5 (five) minutes as needed for chest pain.  25 tablet  3  . Rivaroxaban (XARELTO) 20 MG TABS Take 20 mg by mouth daily.      . traMADol (ULTRAM) 50 MG tablet Take 100 mg by mouth 4 (four) times daily.       [name] warfarin (COUMADIN) 4 MG tablet Take as directed by coumadin clinic  45 tablet  3     Past Medical History  Diagnosis Date  . HTN (hypertension)   . Hernia     Incisional hernia, umbilicus.  . H/O: hysterectomy   . Bradycardia     s/p pacer ([facility] => GT)  . Atrial fibrillation   . Depression   . Goiter   . DJD (degenerative joint disease)   . GERD (gastroesophageal reflux  disease)   . Diverticulosis   . Cardiomyopathy secondary     Myoview 3/13: small inf scar apex to base with mild peri-infarct isch; EF 32%;   LHC 05/12/11: pLAD 20%, pCFX 20%, mid 30%, m+dRCA with mild luminal irregs; EF 35% with mid inf wal and inf-apical HK(no culprit for segmental WMA)  . CAD (coronary artery disease)     nonobs by [facility] 4/13    ROS:   All systems reviewed and negative except as noted in the HPI.   Past Surgical History  Procedure Date  . Pacemaker placement , 2002.  [name] Cholecystectomy      Family History  Problem Relation Age of Onset  . Coronary artery disease    . Other      stomach carcinoma     History   Social History  . Marital Status: Married    Spouse Name: N/A    Number of Children: N/A  . Years of Education: N/A   Occupational History  . Not on file.   Social History Main Topics  . Smoking status: Former Smoker    Types: Cigarettes  . Smokeless tobacco: Not on file  . Alcohol Use: Not  on file  . Drug Use: Not on file  . Sexually Active: Not on file   Other Topics Concern  . Not on file   Social History Narrative    Married, lives with her husband in a one-level home, three steps to enter, moved from [place] three years ago due to her husband's retirement and his local roots being from [place].     BP 107/68  Pulse 85  Ht 5\' 9"  (1.753 m)  Wt 203 lb (92.08 kg)  BMI 29.98 kg/m2  SpO2 98%  Physical Exam:  Well appearing elderly woman, NAD HEENT: Unremarkable Neck:  No JVD, no thyromegally Lungs:  Clear with no wheezes HEART:  Regular rate rhythm, no murmurs, no rubs, no clicks Abd:  soft, positive bowel sounds, no organomegally, no rebound, no guarding Ext:  2 plus pulses, no edema, no cyanosis, no clubbing Skin:  No rashes no nodules Neuro:  CN II through XII intact, motor grossly intact  DEVICE  Normal device function.  See PaceArt for details.   Assess/Plan:

## 2012-01-22 ENCOUNTER — Encounter (HOSPITAL_COMMUNITY): Payer: Self-pay | Admitting: *Deleted

## 2012-01-22 ENCOUNTER — Inpatient Hospital Stay (HOSPITAL_COMMUNITY)
Admission: EM | Admit: 2012-01-22 | Discharge: 2012-01-25 | DRG: 292 | Disposition: A | Discharge status: Home Health | Payer: Medicare Other | Attending: Internal Medicine | Admitting: Internal Medicine

## 2012-01-22 ENCOUNTER — Other Ambulatory Visit: Payer: Self-pay

## 2012-01-22 ENCOUNTER — Ambulatory Visit (HOSPITAL_BASED_OUTPATIENT_CLINIC_OR_DEPARTMENT_OTHER)
Admission: RE | Admit: 2012-01-22 | Discharge: 2012-01-22 | Disposition: A | Discharge status: Home / Self Care | Payer: Medicare Other | Source: Ambulatory Visit | Attending: Family Medicine | Admitting: Family Medicine

## 2012-01-22 ENCOUNTER — Other Ambulatory Visit (HOSPITAL_BASED_OUTPATIENT_CLINIC_OR_DEPARTMENT_OTHER): Payer: Self-pay | Admitting: Family Medicine

## 2012-01-22 DIAGNOSIS — Z79899 Other long term (current) drug therapy: Secondary | ICD-10-CM

## 2012-01-22 DIAGNOSIS — R7301 Impaired fasting glucose: Secondary | ICD-10-CM

## 2012-01-22 DIAGNOSIS — Z950 Presence of cardiac pacemaker: Secondary | ICD-10-CM | POA: Diagnosis present

## 2012-01-22 DIAGNOSIS — I429 Cardiomyopathy, unspecified: Secondary | ICD-10-CM

## 2012-01-22 DIAGNOSIS — R600 Localized edema: Secondary | ICD-10-CM | POA: Diagnosis present

## 2012-01-22 DIAGNOSIS — E876 Hypokalemia: Secondary | ICD-10-CM | POA: Diagnosis present

## 2012-01-22 DIAGNOSIS — I5023 Acute on chronic systolic (congestive) heart failure: Principal | ICD-10-CM

## 2012-01-22 DIAGNOSIS — D649 Anemia, unspecified: Secondary | ICD-10-CM

## 2012-01-22 DIAGNOSIS — I509 Heart failure, unspecified: Secondary | ICD-10-CM

## 2012-01-22 DIAGNOSIS — I495 Sick sinus syndrome: Secondary | ICD-10-CM | POA: Diagnosis present

## 2012-01-22 DIAGNOSIS — I10 Essential (primary) hypertension: Secondary | ICD-10-CM | POA: Diagnosis present

## 2012-01-22 DIAGNOSIS — R079 Chest pain, unspecified: Secondary | ICD-10-CM

## 2012-01-22 DIAGNOSIS — E785 Hyperlipidemia, unspecified: Secondary | ICD-10-CM | POA: Diagnosis present

## 2012-01-22 DIAGNOSIS — Z7901 Long term (current) use of anticoagulants: Secondary | ICD-10-CM

## 2012-01-22 DIAGNOSIS — I428 Other cardiomyopathies: Secondary | ICD-10-CM | POA: Diagnosis present

## 2012-01-22 DIAGNOSIS — I2510 Atherosclerotic heart disease of native coronary artery without angina pectoris: Secondary | ICD-10-CM

## 2012-01-22 DIAGNOSIS — I4891 Unspecified atrial fibrillation: Secondary | ICD-10-CM

## 2012-01-22 DIAGNOSIS — F3289 Other specified depressive episodes: Secondary | ICD-10-CM | POA: Diagnosis present

## 2012-01-22 DIAGNOSIS — M7989 Other specified soft tissue disorders: Secondary | ICD-10-CM | POA: Insufficient documentation

## 2012-01-22 DIAGNOSIS — K219 Gastro-esophageal reflux disease without esophagitis: Secondary | ICD-10-CM | POA: Diagnosis present

## 2012-01-22 DIAGNOSIS — F329 Major depressive disorder, single episode, unspecified: Secondary | ICD-10-CM | POA: Diagnosis present

## 2012-01-22 LAB — CBC WITH DIFFERENTIAL/PLATELET
Basophils Absolute: 0 10*3/uL (ref 0.0–0.1)
Eosinophils Relative: 4 % (ref 0–5)
HCT: 26.7 % — ABNORMAL LOW (ref 36.0–46.0)
Lymphocytes Relative: 22 % (ref 12–46)
MCHC: 30.3 g/dL (ref 30.0–36.0)
MCV: 69.7 fL — ABNORMAL LOW (ref 78.0–100.0)
Monocytes Absolute: 0.5 10*3/uL (ref 0.1–1.0)
RDW: 18.4 % — ABNORMAL HIGH (ref 11.5–15.5)

## 2012-01-22 LAB — BASIC METABOLIC PANEL
CO2: 26 mEq/L (ref 19–32)
Calcium: 9.7 mg/dL (ref 8.4–10.5)
Creatinine, Ser: 1 mg/dL (ref 0.50–1.10)

## 2012-01-22 LAB — TROPONIN I: Troponin I: <0.3 ng/mL (ref <0.30)

## 2012-01-22 MED ORDER — CARVEDILOL 25 MG PO TABS
25.0000 mg | ORAL_TABLET | Freq: Two times a day (BID) | ORAL | Status: DC
  Administered 2012-01-22 – 2012-01-25 (×6): 25 mg via ORAL
  Filled 2012-01-22 (×9): qty 1

## 2012-01-22 MED ORDER — FUROSEMIDE 10 MG/ML IJ SOLN
40.0000 mg | Freq: Once | INTRAMUSCULAR | Status: AC
  Administered 2012-01-22: 40 mg via INTRAVENOUS
  Filled 2012-01-22: qty 4

## 2012-01-22 MED ORDER — ATORVASTATIN CALCIUM 20 MG PO TABS
20.0000 mg | ORAL_TABLET | Freq: Every morning | ORAL | Status: DC
  Administered 2012-01-23 – 2012-01-25 (×3): 20 mg via ORAL
  Filled 2012-01-22 (×3): qty 1

## 2012-01-22 MED ORDER — FUROSEMIDE 10 MG/ML IJ SOLN
40.0000 mg | Freq: Three times a day (TID) | INTRAMUSCULAR | Status: DC
  Administered 2012-01-22 – 2012-01-24 (×7): 40 mg via INTRAVENOUS
  Filled 2012-01-22 (×9): qty 4

## 2012-01-22 MED ORDER — ACETAMINOPHEN 325 MG PO TABS: 650.0000 mg | ORAL_TABLET | ORAL | Status: DC | PRN

## 2012-01-22 MED ORDER — POTASSIUM CHLORIDE CRYS ER 20 MEQ PO TBCR
40.0000 meq | EXTENDED_RELEASE_TABLET | ORAL | Status: AC
  Administered 2012-01-22 – 2012-01-23 (×4): 40 meq via ORAL
  Filled 2012-01-22 (×4): qty 2

## 2012-01-22 MED ORDER — POTASSIUM CHLORIDE CRYS ER 20 MEQ PO TBCR
40.0000 meq | EXTENDED_RELEASE_TABLET | Freq: Once | ORAL | Status: AC
  Administered 2012-01-22: 40 meq via ORAL
  Filled 2012-01-22: qty 2

## 2012-01-22 MED ORDER — IRBESARTAN 300 MG PO TABS
300.0000 mg | ORAL_TABLET | Freq: Every day | ORAL | Status: DC
  Administered 2012-01-22 – 2012-01-24 (×3): 300 mg via ORAL
  Filled 2012-01-22 (×3): qty 1

## 2012-01-22 MED ORDER — SODIUM CHLORIDE 0.9 % IJ SOLN: 3.0000 mL | INTRAMUSCULAR | Status: DC | PRN

## 2012-01-22 MED ORDER — NITROGLYCERIN 0.4 MG SL SUBL: 0.4000 mg | SUBLINGUAL_TABLET | SUBLINGUAL | Status: DC | PRN

## 2012-01-22 MED ORDER — TRAMADOL HCL 50 MG PO TABS
100.0000 mg | ORAL_TABLET | Freq: Four times a day (QID) | ORAL | Status: DC | PRN
  Administered 2012-01-24 – 2012-01-25 (×3): 100 mg via ORAL
  Filled 2012-01-22: qty 1
  Filled 2012-01-22: qty 2
  Filled 2012-01-22: qty 1
  Filled 2012-01-22: qty 2

## 2012-01-22 MED ORDER — SODIUM CHLORIDE 0.9 % IJ SOLN
3.0000 mL | Freq: Two times a day (BID) | INTRAMUSCULAR | Status: DC
  Administered 2012-01-22 – 2012-01-25 (×5): 3 mL via INTRAVENOUS

## 2012-01-22 MED ORDER — ASPIRIN EC 81 MG PO TBEC
81.0000 mg | DELAYED_RELEASE_TABLET | Freq: Every day | ORAL | Status: DC
  Administered 2012-01-22 – 2012-01-25 (×4): 81 mg via ORAL
  Filled 2012-01-22 (×4): qty 1

## 2012-01-22 MED ORDER — PANTOPRAZOLE SODIUM 20 MG PO TBEC
20.0000 mg | DELAYED_RELEASE_TABLET | Freq: Every day | ORAL | Status: DC
  Administered 2012-01-22 – 2012-01-25 (×4): 20 mg via ORAL
  Filled 2012-01-22 (×4): qty 1

## 2012-01-22 MED ORDER — NITROGLYCERIN 2 % TD OINT
1.0000 [in_us] | TOPICAL_OINTMENT | Freq: Once | TRANSDERMAL | Status: AC
  Administered 2012-01-22: 1 [in_us] via TOPICAL
  Filled 2012-01-22: qty 30

## 2012-01-22 MED ORDER — SODIUM CHLORIDE 0.9 % IV SOLN: 250.0000 mL | INTRAVENOUS | Status: DC | PRN

## 2012-01-22 MED ORDER — POTASSIUM CHLORIDE CRYS ER 20 MEQ PO TBCR
40.0000 meq | EXTENDED_RELEASE_TABLET | Freq: Every day | ORAL | Status: DC
  Filled 2012-01-22: qty 2

## 2012-01-22 MED ORDER — ONDANSETRON HCL 4 MG/2ML IJ SOLN
4.0000 mg | Freq: Four times a day (QID) | INTRAMUSCULAR | Status: DC | PRN
Start: 2012-01-22 — End: 2012-01-25

## 2012-01-22 MED ORDER — RIVAROXABAN 20 MG PO TABS
20.0000 mg | ORAL_TABLET | Freq: Every day | ORAL | Status: DC
  Administered 2012-01-22 – 2012-01-24 (×3): 20 mg via ORAL
  Filled 2012-01-22 (×4): qty 1

## 2012-01-22 MED ORDER — VITAMIN D (ERGOCALCIFEROL) 1.25 MG (50000 UNIT) PO CAPS: 50000.0000 [IU] | ORAL_CAPSULE | ORAL | Status: DC

## 2012-01-22 NOTE — ED Notes (Signed)
Transporter called for transfer to floor. Sts will be approx 20 min.

## 2012-01-22 NOTE — ED Notes (Addendum)
Betti Cruz, MD at bedside. Attempted to call report to [phone]. Unable to reach RN, return contact number left.

## 2012-01-22 NOTE — ED Notes (Signed)
Pt sent from [facility] PCP due to low HGB- 7.7. Pt reports increased weakness. Denies rectal bleeding. Reports dizziness, but states that has been going on for years. Denies pain.

## 2012-01-22 NOTE — ED Provider Notes (Signed)
History     CSN: [id number]  Arrival date & time 01/22/12  1212   First MD Initiated Contact with Patient 01/22/12 1234      Chief Complaint  Patient presents with  . Anemia  . Dizziness    (Consider location/radiation/quality/duration/timing/severity/associated sxs/prior treatment) HPI Complains of generalized weakness and dyspnea with exertion progressively worsening for approximately the past 4 months. Seen by Dr.Zanard at the office at [facility] today for worsening bilateral leg edema over the past 4 or 5 days. Further history from Dr. Alberteen  via telephone patient has had intermittent leg edema for many months. Patient was evaluated this morning noted to have hemoglobin of 7.7. One year ago her hemoglobin was 13. Patient denies abdominal pain denies rectal bleeding no other complaint no treatment prior to coming here no chest pain no other associated symptoms. Past Medical History  Diagnosis Date  . HTN (hypertension)   . Hernia     Incisional hernia, umbilicus.  . H/O: hysterectomy   . Bradycardia     s/p pacer ([facility] => GT)  . Atrial fibrillation   . Depression   . Goiter   . DJD (degenerative joint disease)   . GERD (gastroesophageal reflux disease)   . Diverticulosis   . Cardiomyopathy secondary     Myoview 3/13: small inf scar apex to base with mild peri-infarct isch; EF 32%;   LHC 05/12/11: pLAD 20%, pCFX 20%, mid 30%, m+dRCA with mild luminal irregs; EF 35% with mid inf wal and inf-apical HK(no culprit for segmental WMA)  . CAD (coronary artery disease)     nonobs by [facility] 4/13    Past Surgical History  Procedure Date  . Pacemaker placement , 2002.  [name] Cholecystectomy     Family History  Problem Relation Age of Onset  . Coronary artery disease    . Other      stomach carcinoma    History  Substance Use Topics  . Smoking status: Former Smoker    Types: Cigarettes  . Smokeless tobacco: Not on file  . Alcohol Use: Not on file    OB History     Grav Para Term Preterm Abortions TAB SAB Ect Mult Living                  Review of Systems  Constitutional: Positive for fatigue.  Respiratory: Positive for shortness of breath.   Cardiovascular: Positive for leg swelling.    Allergies  Review of patient's allergies indicates no known allergies.  Home Medications   Current Outpatient Rx  Name  Route  Sig  Dispense  Refill  . AMLODIPINE BESYLATE-VALSARTAN 10-320 MG PO TABS   Oral   Take 1 tablet by mouth daily.   30 tablet   11   . ASPIRIN EC 81 MG PO TBEC   Oral   Take 1 tablet (81 mg total) by mouth daily.         . ATORVASTATIN CALCIUM 20 MG PO TABS   Oral   Take 1 tablet (20 mg total) by mouth daily.   30 tablet   11   . CARVEDILOL 25 MG PO TABS   Oral   Take 25 mg by mouth 2 (two) times daily with a meal.           . ERGOCALCIFEROL 50000 UNITS PO CAPS   Oral   Take 50,000 Units by mouth once a week.           . FUROSEMIDE  40 MG PO TABS   Oral   Take 1 tablet (40 mg total) by mouth daily. Take 1/2 to 1 tablet every morning for increased swelling   30 tablet   3   . LANSOPRAZOLE 15 MG PO CPDR   Oral   Take 15 mg by mouth daily.           [name] NITROGLYCERIN 0.4 MG SL SUBL   Sublingual   Place 1 tablet (0.4 mg total) under the tongue every 5 (five) minutes as needed for chest pain.   25 tablet   3   . RIVAROXABAN 20 MG PO TABS   Oral   Take 20 mg by mouth daily.         . TRAMADOL HCL 50 MG PO TABS   Oral   Take 100 mg by mouth 4 (four) times daily.          . WARFARIN SODIUM 4 MG PO TABS      Take as directed by coumadin clinic   45 tablet   3     BP 142/77  Pulse 81  Temp 98.5 F (36.9 C)  Resp 16  SpO2 98%  Physical Exam  Nursing note and vitals reviewed. Constitutional: She appears well-developed and well-nourished.  HENT:  Head: Normocephalic and atraumatic.  Eyes: Conjunctivae normal are normal. Pupils are equal, round, and reactive to light.  Neck: Neck supple.  JVD present. No tracheal deviation present. No thyromegaly present.       Positive JVD  Cardiovascular: Normal rate and regular rhythm.   No murmur heard. Pulmonary/Chest: Effort normal and breath sounds normal.  Abdominal: Soft. Bowel sounds are normal. She exhibits no distension. There is no tenderness.  Genitourinary:       Normal tone brown stool  Musculoskeletal: Normal range of motion. She exhibits edema. She exhibits no tenderness.       3+ pretibial edema bilaterally  Neurological: She is alert. Coordination normal.  Skin: Skin is warm and dry. No rash noted.  Psychiatric: She has a normal mood and affect.    ED Course  Procedures (including critical care time)  Labs Reviewed - No data to display Dg Chest 2 View  01/22/2012  *RADIOLOGY REPORT*  Clinical Data: Limb swelling, cardiomyopathy  CHEST - 2 VIEW  Comparison: 02/17/2010  Findings: Mild right upper lobe/left perihilar opacity, favored to reflect mild interstitial edema.  Infection is considered less likely.  Cardiomegaly.  Left subclavian pacemaker.  Degenerative changes of the visualized thoracolumbar spine.  IMPRESSION: Cardiomegaly with suspected mild interstitial edema.   Original Report Authenticated By: Charline Bills, M.D.      No diagnosis found.    Date: 01/22/2012  Rate: 70  Rhythm: Electronically paced  QRS Axis: left  Intervals: normal  ST/T Wave abnormalities: normal  Conduction Disutrbances:left bundle branch block  Narrative Interpretation:   Old EKG Reviewed: No significant change from 05/12/2011 interpreted by me Chest x-ray reviewed by me Results for orders placed during the hospital encounter of 01/22/12  CBC WITH DIFFERENTIAL      Component Value Range   WBC 4.9  4.0 - 10.5 K/uL   RBC 3.83 (*) 3.87 - 5.11 MIL/uL   Hemoglobin 8.1 (*) 12.0 - 15.0 g/dL   HCT 16.1 (*) 09.6 - 04.5 %   MCV 69.7 (*) 78.0 - 100.0 fL   MCH 21.1 (*) 26.0 - 34.0 pg   MCHC 30.3  30.0 - 36.0 g/dL   RDW 40.9 (*)  81.1 -  15.5 %   Platelets 292  150 - 400 K/uL   Neutrophils Relative 65  43 - 77 %   Neutro Abs 3.2  1.7 - 7.7 K/uL   Lymphocytes Relative 22  12 - 46 %   Lymphs Abs 1.1  0.7 - 4.0 K/uL   Monocytes Relative 10  3 - 12 %   Monocytes Absolute 0.5  0.1 - 1.0 K/uL   Eosinophils Relative 4  0 - 5 %   Eosinophils Absolute 0.2  0.0 - 0.7 K/uL   Basophils Relative 0  0 - 1 %   Basophils Absolute 0.0  0.0 - 0.1 K/uL  BASIC METABOLIC PANEL      Component Value Range   Sodium 138  135 - 145 mEq/L   Potassium 3.1 (*) 3.5 - 5.1 mEq/L   Chloride 100  96 - 112 mEq/L   CO2 26  19 - 32 mEq/L   Glucose, Bld 105 (*) 70 - 99 mg/dL   BUN 11  6 - 23 mg/dL   Creatinine, Ser 4.78  0.50 - 1.10 mg/dL   Calcium 9.7  8.4 - 29.5 mg/dL   GFR calc non Af Amer 53 (*) >90 mL/min   GFR calc Af Amer 61 (*) >90 mL/min  TYPE AND SCREEN      Component Value Range   ABO/RH(D) O POS     Antibody Screen NEG     Sample Expiration 01/25/2012    PRO B NATRIURETIC PEPTIDE      Component Value Range   Pro B Natriuretic peptide (BNP) 2781.0 (*) 0 - 450 pg/mL  OCCULT BLOOD, POC DEVICE      Component Value Range   Fecal Occult Bld NEGATIVE  NEGATIVE  TROPONIN I      Component Value Range   Troponin I <0.30  <0.30 ng/mL  ABO/RH      Component Value Range   ABO/RH(D) O POS     Dg Chest 2 View  01/22/2012  *RADIOLOGY REPORT*  Clinical Data: Limb swelling, cardiomyopathy  CHEST - 2 VIEW  Comparison: 02/17/2010  Findings: Mild right upper lobe/left perihilar opacity, favored to reflect mild interstitial edema.  Infection is considered less likely.  Cardiomegaly.  Left subclavian pacemaker.  Degenerative changes of the visualized thoracolumbar spine.  IMPRESSION: Cardiomegaly with suspected mild interstitial edema.   Original Report Authenticated By: Charline Bills, M.D.     MDM  Spoke with Dr.Reddy. Plan admit to telemetry diuresis, nitrates, potassium repletion, monitor hemoglobin, transfuse as needed Diagnosis #1  congestive heart failure #2 hypokalemia #3 symptomatic anemia        Doug Sou, MD 01/22/12 1439

## 2012-01-22 NOTE — H&P (Signed)
Patient's PCP: [name], MD  Chief Complaint: Shortness of breath on exertion  History of Present Illness: [name] is a 78 y.o. African American female with history of hypertension, A. fib on chronic anticoagulation, pacemaker, mild nonobstructive coronary artery disease by left heart catheter April 2013, hypertension, and GERD who presents with the above complaints.  Patient reports that her symptoms started about 3-4 weeks ago when she noticed shortness of breath with exertion.  Usually she is very active at over the last few weeks walking to the mailbox which is 100 feet would cause her to be short of breath.  She has seen her primary care physician who checked her hemoglobin which came back at 7.7 as a result she was directed to the emergency department for further evaluation.  Also in the last 4-5 days she is noted worsening edema in lower extremities bilaterally.  She denies any fevers, chills, nausea, vomiting, chest pain, shortness of breath only with exertion, abdominal pain, diarrhea, headaches or vision changes.  Has not noted any blood in stools or black stools.  Past Medical History  Diagnosis Date  . HTN (hypertension)   . Hernia     Incisional hernia, umbilicus.  . H/O: hysterectomy   . Bradycardia     s/p pacer (Tenant => GT)  . Atrial fibrillation   . Depression   . Goiter   . DJD (degenerative joint disease)   . GERD (gastroesophageal reflux disease)   . Diverticulosis   . Cardiomyopathy secondary     Myoview 3/13: small inf scar apex to base with mild peri-infarct isch; EF 32%;   LHC 05/12/11: pLAD 20%, pCFX 20%, mid 30%, m+dRCA with mild luminal irregs; EF 35% with mid inf wal and inf-apical HK(no culprit for segmental WMA)  . CAD (coronary artery disease)     nonobs by [facility] 4/13   Past Surgical History  Procedure Date  . Pacemaker placement , 2002.  [name] Cholecystectomy    Family History  Problem Relation Age of Onset  . Adopted: Yes  . Coronary artery  disease    . Other      stomach carcinoma  . Stomach cancer Other   . Coronary artery disease Other    History   Social History  . Marital Status: Married    Spouse Name: N/A    Number of Children: N/A  . Years of Education: N/A   Occupational History  . Not on file.   Social History Main Topics  . Smoking status: Former Smoker    Types: Cigarettes  . Smokeless tobacco: Former Neurosurgeon    Quit date: 01/21/2009  . Alcohol Use: No  . Drug Use: Not on file  . Sexually Active: No   Other Topics Concern  . Not on file   Social History Narrative    Married, lives with her husband in a one-level home, three steps to enter, moved from [place] three years ago due to her husband's retirement and his local roots being from [place].   Allergies: Review of patient's allergies indicates no known allergies.  Home Meds: Prior to Admission medications   Medication Sig Start Date End Date Taking? Authorizing Provider  acetaminophen (TYLENOL) 500 MG tablet Take 1,000 mg by mouth every 6 (six) hours as needed. Joint pain   Yes Historical Provider, MD  amLODipine-valsartan (EXFORGE) 10-320 MG per tablet Take 1 tablet by mouth every morning. 05/27/11 05/26/12 Yes Beatrice Lecher, PA  aspirin EC 81 MG tablet Take 81  mg by mouth every morning.  05/25/11  Yes Beatrice Lecher, PA  atorvastatin (LIPITOR) 20 MG tablet Take 20 mg by mouth every morning. 05/25/11 05/24/12 Yes Beatrice Lecher, PA  carvedilol (COREG) 25 MG tablet Take 25 mg by mouth 2 (two) times daily with a meal.     Yes Historical Provider, MD  ergocalciferol (VITAMIN D2) 50000 UNITS capsule Take 50,000 Units by mouth every Wednesday.    Yes Historical Provider, MD  furosemide (LASIX) 40 MG tablet Take 40 mg by mouth every morning. 07/17/11  Yes Marinus Maw, MD  lansoprazole (PREVACID) 15 MG capsule Take 15 mg by mouth every morning.    Yes Historical Provider, MD  Rivaroxaban (XARELTO) 20 MG TABS Take 20 mg by mouth daily with supper.     Yes Historical Provider, MD  traMADol (ULTRAM) 50 MG tablet Take 100 mg by mouth 4 (four) times daily as needed. Joint pain   Yes Historical Provider, MD  nitroGLYCERIN (NITROSTAT) 0.4 MG SL tablet Place 1 tablet (0.4 mg total) under the tongue every 5 (five) minutes as needed for chest pain. 04/17/11 04/16/12  Marinus Maw, MD    Review of Systems: All systems reviewed with the patient and positive as per history of present illness, otherwise all other systems are negative.  Physical Exam: Blood pressure 148/69, pulse 81, temperature 98.5 F (36.9 C), resp. rate 14, SpO2 100.00%. General: Awake, Oriented x3, No acute distress. HEENT: EOMI, Moist mucous membranes Neck: Supple CV: S1 and S2 Lungs: Clear to ascultation bilaterally Abdomen: Soft, Nontender, Nondistended, +bowel sounds. Ext: Good pulses.  2-3+ lower extremity edema. No clubbing or cyanosis noted. Neuro: Cranial Nerves II-XII grossly intact. Has 5/5 motor strength in upper and lower extremities.  Lab results:  Basename 01/22/12 1245  NA 138  K 3.1*  CL 100  CO2 26  GLUCOSE 105*  BUN 11  CREATININE 1.00  CALCIUM 9.7  MG --  PHOS --   No results found for this basename: AST:2,ALT:2,ALKPHOS:2,BILITOT:2,PROT:2,ALBUMIN:2 in the last 72 hours No results found for this basename: LIPASE:2,AMYLASE:2 in the last 72 hours  Basename 01/22/12 1245  WBC 4.9  NEUTROABS 3.2  HGB 8.1*  HCT 26.7*  MCV 69.7*  PLT 292    Basename 01/22/12 1323  CKTOTAL --  CKMB --  CKMBINDEX --  TROPONINI <0.30   No components found with this basename: POCBNP:3 No results found for this basename: DDIMER in the last 72 hours No results found for this basename: HGBA1C:2 in the last 72 hours No results found for this basename: CHOL:2,HDL:2,LDLCALC:2,TRIG:2,CHOLHDL:2,LDLDIRECT:2 in the last 72 hours No results found for this basename: TSH,T4TOTAL,FREET3,T3FREE,THYROIDAB in the last 72 hours No results found for this basename:  VITAMINB12:2,FOLATE:2,FERRITIN:2,TIBC:2,IRON:2,RETICCTPCT:2 in the last 72 hours Imaging results:  Dg Chest 2 View  01/22/2012  *RADIOLOGY REPORT*  Clinical Data: Limb swelling, cardiomyopathy  CHEST - 2 VIEW  Comparison: 02/17/2010  Findings: Mild right upper lobe/left perihilar opacity, favored to reflect mild interstitial edema.  Infection is considered less likely.  Cardiomegaly.  Left subclavian pacemaker.  Degenerative changes of the visualized thoracolumbar spine.  IMPRESSION: Cardiomegaly with suspected mild interstitial edema.   Original Report Authenticated By: Charline Bills, M.D.    Other results: EKG: Ventricular paced complexes with a heart rate of 70.  Assessment & Plan by Problem: Shortness of breath with exertion likely related to anemia Admit the patient to telemetry.  Cycle cardiac enzymes to rule the patient for acute coronary syndrome.  Patient had  a 2-D echocardiogram performed on 10/14/2011 which showed diffuse hypokinesis worse in the mid and apical segments, cavity size severely dilated, wall thickness was increased in a pattern of mild LVH, ejection fraction 30%.  Do not see the need to repeat the echocardiogram at this time.  Patient ambulatory in the emergency department has been walking to the bathroom unassisted.  Anemia Etiology unclear.  Reports having had a colonoscopy performed about 4 years ago and reported that she was told she had hemorrhoids.  Stool guaiac in the emergency department was negative.  Continue to check stool guaiac x3.  Check anemia panel in the morning.  Hemoglobin 8.1, do not see the need for transfusion at this time.  If hemoglobin continues to trend down or if stool guaiac cards turn positive consider GI consult as an inpatient.  May benefit from outpatient GI consult.  Continue anticoagulation on Rivaroxaban for now as she does not have any acute bleeding at this time.  Lower extremity edema/acute on chronic systolic congestive heart  failure Worsened due to anemia with resulting low oncotic pressure.  Diuresis the patient on IV Lasix 40 mg every 8 hours.  Discontinue amlodipine which can exacerbate edema.  Irbesartan (ARB) 300 mg daily has been substituted for home valsartan.  Continue carvedilol.  Continue aspirin.  Atrial fibrillation Rate controlled, patient ventricular paced.  On chronic anticoagulation with Rivaroxaban.  Hypertension Continue carvedilol.  Irbesartan substituted for valsartan.  Stable.  Amlodipine held.  Hyperlipidemia Continue statin.  Hypokalemia Replace as needed.  Check magnesium in the morning.  Nonobstructive coronary artery disease/nonischemic cardiomyopathy Management as indicated above.  GERD Continue PPI.  Prophylaxis Rivaroxaban.  Code status Full code.  This was discussed with the patient the time of admission.  Disposition Admit the patient to telemetry is inpatient.  Time spent on admission, talking to the patient, and coordinating care was: 60 mins.  Sergio Hobart A, MD 01/22/2012, 3:51 PM

## 2012-01-23 LAB — RETICULOCYTES
RBC.: 3.87 MIL/uL (ref 3.87–5.11)
Retic Count, Absolute: 61.9 10*3/uL (ref 19.0–186.0)
Retic Ct Pct: 1.6 % (ref 0.4–3.1)

## 2012-01-23 LAB — BASIC METABOLIC PANEL
BUN: 11 mg/dL (ref 6–23)
CO2: 27 mEq/L (ref 19–32)
Calcium: 9.8 mg/dL (ref 8.4–10.5)
Creatinine, Ser: 0.96 mg/dL (ref 0.50–1.10)
GFR calc non Af Amer: 56 mL/min — ABNORMAL LOW (ref >90)
Glucose, Bld: 83 mg/dL (ref 70–99)
Sodium: 138 mEq/L (ref 135–145)

## 2012-01-23 LAB — IRON AND TIBC
Saturation Ratios: 5 % — ABNORMAL LOW (ref 20–55)
UIBC: 388 ug/dL (ref 125–400)

## 2012-01-23 LAB — CBC
HCT: 26.6 % — ABNORMAL LOW (ref 36.0–46.0)
MCH: 20.9 pg — ABNORMAL LOW (ref 26.0–34.0)
MCV: 68.7 fL — ABNORMAL LOW (ref 78.0–100.0)
Platelets: 267 10*3/uL (ref 150–400)
RDW: 18.3 % — ABNORMAL HIGH (ref 11.5–15.5)
WBC: 4.7 10*3/uL (ref 4.0–10.5)

## 2012-01-23 MED ORDER — POTASSIUM CHLORIDE CRYS ER 20 MEQ PO TBCR
40.0000 meq | EXTENDED_RELEASE_TABLET | Freq: Two times a day (BID) | ORAL | Status: DC
  Administered 2012-01-23 – 2012-01-25 (×5): 40 meq via ORAL
  Filled 2012-01-23 (×6): qty 2

## 2012-01-23 MED ORDER — MAGNESIUM OXIDE 400 (241.3 MG) MG PO TABS
200.0000 mg | ORAL_TABLET | Freq: Two times a day (BID) | ORAL | Status: AC
  Administered 2012-01-23: 200 mg via ORAL
  Filled 2012-01-23: qty 0.5

## 2012-01-23 MED ORDER — ZOLPIDEM TARTRATE 5 MG PO TABS
5.0000 mg | ORAL_TABLET | Freq: Once | ORAL | Status: AC
  Administered 2012-01-23: 5 mg via ORAL
  Filled 2012-01-23: qty 1

## 2012-01-23 NOTE — Progress Notes (Addendum)
[facility] PROGRESS NOTE  [name] ZOX:[id number] DOB: 1932-10-23 DOA: 01/22/2012 PCP: [name], MD  Assessment/Plan: 1-Acute on chronic systolic congestive heart failure; Patient relates improvement of SOB. Urine out put 2.7 L 12-13., Weight 85 kg on admission.  Cardiac enzymes negative. Continue with Lasix 40 mg IV Q 8 hours. Continue with irbesartan. Change K-cl to 40 meq BID.  Daily weight, Strict I and O.  Check BNP am.   2-Anemia; SOB improved. If symptoms persist after treating Heart failure will consider Blood transfusion. Continue checking Guaiac stool, if positive will consult GI for inpatient evaluation because patient is on anticoagulation. Will follow anemia panel and will start supplement as needed.  Last Hb at 14 on March 2013. 3-Atrial fibrillation Rate controlled. Continue with coreg, Xarelto.  4-Hypertension  Continue carvedilol. Irbesartan. Agree with stopping Amlodipine.  5-Hyperlipidemia  Continue statin.  6-Hypokalemia   Resolved. Magnesium at 1.9.  7-GERD  Continue PPI.  8-Prophylaxis  Rivaroxaban.   Code Status: Full Code. Family Communication: Care discussed with patient. Disposition Plan: Remain inpatient.    Consultants:  None  Procedures:  None  Antibiotics:  None  HPI/Subjective: Relate shortness of breath better. She has had shortness of breath for last 3 weeks at rest and on  No chest pain. Objective: Filed Vitals:   01/22/12 1530 01/22/12 1620 01/22/12 2115 01/23/12 0456  BP: 148/69 143/70 136/56 140/58  Pulse:  74 69 70  Temp:  98.2 F (36.8 C) 97.7 F (36.5 C) 97.6 F (36.4 C)  TempSrc:  Oral Axillary Oral  Resp: 14 18 18 18   Height:  5\' 11"  (1.803 m)    Weight:  88.587 kg (195 lb 4.8 oz)  85.322 kg (188 lb 1.6 oz)  SpO2: 100% 100% 100% 99%    Intake/Output Summary (Last 24 hours) at 01/23/12 0858 Last data filed at 01/23/12 0800  Gross per 24 hour  Intake    460 ml  Output   2750 ml  Net  -2290 ml    Filed Weights   01/22/12 1620 01/23/12 0456  Weight: 88.587 kg (195 lb 4.8 oz) 85.322 kg (188 lb 1.6 oz)    Exam:   General:  No distress.  Cardiovascular: S 1, S 2 RRR  Respiratory: Bilateral crackles, no wheezes.  Abdomen: Bs Present, soft, NT  Extremities: Plus 2 edema.   Data Reviewed: Basic Metabolic Panel:  Lab 01/23/12 8119 01/22/12 1245  NA 138 138  K 3.9 3.1*  CL 99 100  CO2 27 26  GLUCOSE 83 105*  BUN 11 11  CREATININE 0.96 1.00  CALCIUM 9.8 9.7  MG 1.9 --  PHOS -- --  CBC:  Lab 01/23/12 0515 01/22/12 1245  WBC 4.7 4.9  NEUTROABS -- 3.2  HGB 8.1* 8.1*  HCT 26.6* 26.7*  MCV 68.7* 69.7*  PLT 267 292   Cardiac Enzymes:  Lab 01/23/12 0025 01/22/12 1820 01/22/12 1323  CKTOTAL -- -- --  CKMB -- -- --  CKMBINDEX -- -- --  TROPONINI <0.30 <0.30 <0.30   BNP (last 3 results)  Basename 01/22/12 1245  PROBNP 2781.0*   CBG: No results found for this basename: GLUCAP:5 in the last 168 hours  No results found for this or any previous visit (from the past 240 hour(s)).   Studies: Dg Chest 2 View  01/22/2012  *RADIOLOGY REPORT*  Clinical Data: Limb swelling, cardiomyopathy  CHEST - 2 VIEW  Comparison: 02/17/2010  Findings: Mild right upper lobe/left perihilar opacity, favored to reflect mild  interstitial edema.  Infection is considered less likely.  Cardiomegaly.  Left subclavian pacemaker.  Degenerative changes of the visualized thoracolumbar spine.  IMPRESSION: Cardiomegaly with suspected mild interstitial edema.   Original Report Authenticated By: Charline Bills, M.D.     Scheduled Meds:   . aspirin EC  81 mg Oral Daily  . atorvastatin  20 mg Oral q morning - 10a  . carvedilol  25 mg Oral BID WC  . furosemide  40 mg Intravenous Q8H  . irbesartan  300 mg Oral Daily  . pantoprazole  20 mg Oral Daily  . potassium chloride  40 mEq Oral Daily  . Rivaroxaban  20 mg Oral Q supper  . sodium chloride  3 mL Intravenous Q12H  . Vitamin D  (Ergocalciferol)  50,000 Units Oral Q Wed   Continuous Infusions:   Principal Problem:  *Dyspnea on exertion Active Problems:  Essential hypertension, benign  Sinoatrial node dysfunction  PPM-St.Jude  Atrial fibrillation  Long term (current) use of anticoagulants  Cardiomyopathy, secondary  CAD (coronary artery disease)  Nonischemic cardiomyopathy  Anemia  Lower extremity edema  Acute on chronic systolic CHF (congestive heart failure)  GERD (gastroesophageal reflux disease)  Hypokalemia    Time spent: more than 35 minutes.    [name]  [facility] Pager [phone]. If 8PM-8AM, please contact night-coverage at [email or web address], password [facility] 01/23/2012, 8:58 AM  LOS: 1 day

## 2012-01-23 NOTE — Progress Notes (Signed)
Utilization review completed.  

## 2012-01-24 LAB — CBC
HCT: 27.3 % — ABNORMAL LOW (ref 36.0–46.0)
MCHC: 30 g/dL (ref 30.0–36.0)
MCV: 70 fL — ABNORMAL LOW (ref 78.0–100.0)
Platelets: 267 10*3/uL (ref 150–400)
RDW: 18.3 % — ABNORMAL HIGH (ref 11.5–15.5)
WBC: 4.6 10*3/uL (ref 4.0–10.5)

## 2012-01-24 LAB — BASIC METABOLIC PANEL
BUN: 15 mg/dL (ref 6–23)
CO2: 29 mEq/L (ref 19–32)
Calcium: 10.1 mg/dL (ref 8.4–10.5)
Creatinine, Ser: 1.24 mg/dL — ABNORMAL HIGH (ref 0.50–1.10)
GFR calc Af Amer: 47 mL/min — ABNORMAL LOW (ref >90)

## 2012-01-24 MED ORDER — ZOLPIDEM TARTRATE 5 MG PO TABS
5.0000 mg | ORAL_TABLET | Freq: Once | ORAL | Status: AC
  Administered 2012-01-24: 5 mg via ORAL
  Filled 2012-01-24: qty 1

## 2012-01-24 MED ORDER — FUROSEMIDE 10 MG/ML IJ SOLN
20.0000 mg | Freq: Once | INTRAMUSCULAR | Status: AC
  Administered 2012-01-24: 20 mg via INTRAVENOUS
  Filled 2012-01-24: qty 2

## 2012-01-24 MED ORDER — FERROUS SULFATE 325 (65 FE) MG PO TABS
325.0000 mg | ORAL_TABLET | Freq: Three times a day (TID) | ORAL | Status: DC
  Administered 2012-01-25 (×2): 325 mg via ORAL
  Filled 2012-01-24 (×5): qty 1

## 2012-01-24 MED ORDER — FUROSEMIDE 10 MG/ML IJ SOLN
40.0000 mg | Freq: Two times a day (BID) | INTRAMUSCULAR | Status: DC
  Administered 2012-01-25: 40 mg via INTRAVENOUS
  Filled 2012-01-24 (×3): qty 4

## 2012-01-24 NOTE — Progress Notes (Signed)
[facility] PROGRESS NOTE  [name] ZOX:[id number] DOB: 1932/09/09 DOA: 01/22/2012 PCP: [name], MD  Assessment/Plan: 1-Acute on chronic systolic congestive heart failure;  Patient relates improvement of SOB.  Negative 4 L.  Weight 85 kg on admission.  Cardiac enzymes negative.  Change lasix to  40 mg IV Q 12 hours. Dicontinue with irbesartan. Mild increase Cr.   K-cl to 40 meq BID.  Daily weight, Strict I and O.   BNP 2122.  2-Anemia; SOB improved. If symptoms persist after treating Heart failure will consider Blood transfusion. Continue checking Guaiac stool, if positive will consult GI for inpatient evaluation because patient is on anticoagulation. Iron: 20, Ferritin 13. Start Ferrous sulfate.  Last Hb at 14 on March 2013.  Will transfuse 1 unit due to symptomatic anemia.  3-Atrial fibrillation  Rate controlled. Continue with coreg, Xarelto.  4-Hypertension  Continue carvedilol. Hold Irbesartan. Agree with stopping Amlodipine.  5-Hyperlipidemia  Continue statin.  6-Hypokalemia  Resolved. Magnesium at 1.9.  7-GERD  Continue PPI.  8-Prophylaxis  Rivaroxaban.  Code Status: Full Code.  Family Communication: Care discussed with patient.  Disposition Plan: Remain inpatient.  Consultants:  None Procedures:  None Antibiotics:  None  HPI/Subjective: Breathing better. Still feels weak, and SOB on exertion.   Objective: Filed Vitals:   01/24/12 1608 01/24/12 1640 01/24/12 1740 01/24/12 1840  BP: 105/55 107/77 126/60 117/60  Pulse:  69 76 70  Temp: 97.9 F (36.6 C) 98.2 F (36.8 C) 98 F (36.7 C) 98.1 F (36.7 C)  TempSrc: Oral Oral Oral Oral  Resp: 20 20 20 20   Height:      Weight:      SpO2:        Intake/Output Summary (Last 24 hours) at 01/24/12 1853 Last data filed at 01/24/12 1750  Gross per 24 hour  Intake   1208 ml  Output   3500 ml  Net  -2292 ml   Filed Weights   01/22/12 1620 01/23/12 0456 01/24/12 0515  Weight: 88.587 kg (195 lb 4.8  oz) 85.322 kg (188 lb 1.6 oz) 82.509 kg (181 lb 14.4 oz)    Exam:   General:  No distress.  Cardiovascular: S 1, S 2 RRR  Respiratory: crackle bases  Abdomen: Bs present, soft, NR  Extremities; plus 2 edema  Data Reviewed: Basic Metabolic Panel:  Lab 01/24/12 8119 01/23/12 0515 01/22/12 1245  NA 137 138 138  K 4.2 3.9 3.1*  CL 98 99 100  CO2 29 27 26   GLUCOSE 96 83 105*  BUN 15 11 11   CREATININE 1.24* 0.96 1.00  CALCIUM 10.1 9.8 9.7  MG -- 1.9 --  PHOS -- -- --   CBC:  Lab 01/24/12 0550 01/23/12 0515 01/22/12 1245  WBC 4.6 4.7 4.9  NEUTROABS -- -- 3.2  HGB 8.2* 8.1* 8.1*  HCT 27.3* 26.6* 26.7*  MCV 70.0* 68.7* 69.7*  PLT 267 267 292   Cardiac Enzymes:  Lab 01/23/12 0025 01/22/12 1820 01/22/12 1323  CKTOTAL -- -- --  CKMB -- -- --  CKMBINDEX -- -- --  TROPONINI <0.30 <0.30 <0.30   BNP (last 3 results)  Basename 01/24/12 0550 01/22/12 1245  PROBNP 2122.0* 2781.0*   CBG: No results found for this basename: GLUCAP:5 in the last 168 hours  No results found for this or any previous visit (from the past 240 hour(s)).   Studies: No results found.  Scheduled Meds:   . aspirin EC  81 mg Oral Daily  . atorvastatin  20 mg Oral q morning - 10a  . carvedilol  25 mg Oral BID WC  . furosemide  20 mg Intravenous Once  . furosemide  40 mg Intravenous BID  . pantoprazole  20 mg Oral Daily  . potassium chloride  40 mEq Oral BID  . Rivaroxaban  20 mg Oral Q supper  . sodium chloride  3 mL Intravenous Q12H  . Vitamin D (Ergocalciferol)  50,000 Units Oral Q Wed   Continuous Infusions:   Principal Problem:  *Dyspnea on exertion Active Problems:  Essential hypertension, benign  Sinoatrial node dysfunction  PPM-St.Jude  Atrial fibrillation  Long term (current) use of anticoagulants  Cardiomyopathy, secondary  CAD (coronary artery disease)  Nonischemic cardiomyopathy  Anemia  Lower extremity edema  Acute on chronic systolic CHF (congestive heart  failure)  GERD (gastroesophageal reflux disease)  Hypokalemia    Time spent: 25 minutes.     Hansford Hirt  [facility] Pager [phone]. If 8PM-8AM, please contact night-coverage at [email or web address], password [facility] 01/24/2012, 6:53 PM  LOS: 2 days

## 2012-01-25 LAB — TYPE AND SCREEN
ABO/RH(D): O POS
Unit division: 0

## 2012-01-25 LAB — BASIC METABOLIC PANEL
CO2: 30 mEq/L (ref 19–32)
Calcium: 9.7 mg/dL (ref 8.4–10.5)
Creatinine, Ser: 1.31 mg/dL — ABNORMAL HIGH (ref 0.50–1.10)
GFR calc non Af Amer: 38 mL/min — ABNORMAL LOW (ref >90)
Glucose, Bld: 107 mg/dL — ABNORMAL HIGH (ref 70–99)
Sodium: 135 mEq/L (ref 135–145)

## 2012-01-25 LAB — CBC
MCV: 70.8 fL — ABNORMAL LOW (ref 78.0–100.0)
RBC: 4.18 MIL/uL (ref 3.87–5.11)

## 2012-01-25 MED ORDER — ACETAMINOPHEN 500 MG PO TABS: 500.0000 mg | ORAL_TABLET | Freq: Four times a day (QID) | ORAL | Status: DC | PRN

## 2012-01-25 MED ORDER — FERROUS SULFATE 325 (65 FE) MG PO TABS: 325.0000 mg | ORAL_TABLET | Freq: Three times a day (TID) | ORAL | Status: DC

## 2012-01-25 MED ORDER — POTASSIUM CHLORIDE ER 10 MEQ PO TBCR: 10.0000 meq | EXTENDED_RELEASE_TABLET | Freq: Two times a day (BID) | ORAL | Status: DC

## 2012-01-25 NOTE — Care Management Note (Signed)
    Page 1 of 1   01/25/2012     12:18:11 PM   CARE MANAGEMENT NOTE 01/25/2012  Patient:  [name], [name]   Account Number:  [id number]  Date Initiated:  01/25/2012  Documentation initiated by:  Lanier Clam  Subjective/Objective Assessment:   admitted w/sob.chf.     Action/Plan:   From home. Has pcp,pharmacy.   Anticipated DC Date:  01/25/2012   Anticipated DC Plan:  HOME W HOME HEALTH SERVICES      DC Planning Services  CM consult      Choice offered to / List presented to:  C-1 Patient        HH arranged  HH-1 RN  HH-2 PT      [facility] agency  [facility]   Status of service:  Completed, signed off Medicare Important Message given?   (If response is "NO", the following Medicare IM given date fields will be blank) Date Medicare IM given:   Date Additional Medicare IM given:    Discharge Disposition:  HOME W HOME HEALTH SERVICES  Per UR Regulation:  Reviewed for med. necessity/level of care/duration of stay  If discussed at Long Length of Stay Meetings, dates discussed:    Comments:  01/25/12 Jesusmanuel Erbes RN,BSN NCM [phone] [facility]) INFORMED OF HH ORDERS,& D/C.

## 2012-01-25 NOTE — Evaluation (Signed)
Physical Therapy Evaluation Patient Details Name: [name] MRN: [id number] DOB: [birth year] Today's Date: 01/25/2012 Time: 2841-3244 PT Time Calculation (min): 11 min  PT Assessment / Plan / Recommendation Clinical Impression  Pt presents with dyspnea on exertion and multiple cardiac issues including CAD, cardiomyopathy and afib.  Tolerated OOB and ambulation in hallway well at min/guard for safety with RW.  Noted pt to be somewhat unsteady, therefore recommend HHPT at D/C.  Pts O2 stayed in upper 90's throughout ambulation.  She did c/o some dizziness, however BP 115/77.  RN notified.  Pt to D/C today and all further needs can be met through HHPT.      PT Assessment  All further PT needs can be met in the next venue of care    Follow Up Recommendations  Home health PT    Does the patient have the potential to tolerate intense rehabilitation      Barriers to Discharge        Equipment Recommendations  None recommended by PT    Recommendations for Other Services     Frequency      Precautions / Restrictions Precautions Precautions: Fall Restrictions Weight Bearing Restrictions: No   Pertinent Vitals/Pain No pain      Mobility  Bed Mobility Bed Mobility: Supine to Sit Supine to Sit: 7: Independent Transfers Transfers: Sit to Stand;Stand to Sit Sit to Stand: 7: Independent Stand to Sit: 7: Independent Ambulation/Gait Ambulation/Gait Assistance: 4: Min guard;5: Supervision Ambulation Distance (Feet): 160 Feet Assistive device: Rolling walker Ambulation/Gait Assistance Details: Pt somewhat unsteady with ambulation initially in room without AD, however noted some increased stability with RW.  Recommended that pt use her RW at home for increased safety. Cues for proper use of RW Gait Pattern: Step-through pattern;Decreased stride length;Trunk flexed Gait velocity: decreased Stairs: No Wheelchair Mobility Wheelchair Mobility: No    Shoulder Instructions      Exercises     PT Diagnosis: Difficulty walking;Generalized weakness  PT Problem List: Decreased strength;Decreased activity tolerance;Decreased balance;Decreased mobility;Decreased knowledge of use of DME;Decreased safety awareness;Decreased knowledge of precautions PT Treatment Interventions:     PT Goals    Visit Information  Last PT Received On: 01/25/12 Assistance Needed: +1    Subjective Data  Subjective: I felt off balance when I was walking.  Patient Stated Goal: to go home   Prior Functioning  Home Living Lives With: Spouse Available Help at Discharge: Family;Available 24 hours/day Type of Home: House Home Access: Stairs to enter [employer] of Steps: 3 Entrance Stairs-Rails: Right Home Layout: One level Home Adaptive Equipment: Walker - rolling;Straight cane Prior Function Level of Independence: Independent with assistive device(s) Able to Take Stairs?: Yes Driving: Yes Vocation: Retired Musician: No difficulties    Cognition  Overall Cognitive Status: Appears within functional limits for tasks assessed/performed Arousal/Alertness: Awake/alert Orientation Level: Appears intact for tasks assessed Behavior During Session: [facility] for tasks performed    Extremity/Trunk Assessment Right Lower Extremity Assessment RLE ROM/Strength/Tone: Deficits;WFL for tasks assessed RLE ROM/Strength/Tone Deficits: noted some B LE edema.  RN states pt getting Lasix at this time Left Lower Extremity Assessment LLE ROM/Strength/Tone: Deficits;WFL for tasks assessed LLE ROM/Strength/Tone Deficits: noted some BLE edema.   Trunk Assessment Trunk Assessment: Normal   Balance    End of Session PT - End of Session Activity Tolerance: Patient tolerated treatment well;Patient limited by fatigue Patient left: in chair;with call bell/phone within reach Nurse Communication: Mobility status  GP     Page, Meribeth Mattes 01/25/2012,  12:13 PM

## 2012-01-25 NOTE — Discharge Summary (Signed)
Physician Discharge Summary  [name] BJY:[id number] DOB: 1932-02-23 DOA: 01/22/2012  PCP: [name], MD  Admit date: 01/22/2012 Discharge date: 01/25/2012  Time spent: 35 minutes  Recommendations for Outpatient Follow-up:  1. FU with Cardiologist for lab work, [employer]; Consider decrease lasix if Cr continue to increase. Adjust Xarelto for renal function as needed.  2. FU with PCP for HB check. Patient needs referral to Gastroenterologist.   Discharge Diagnoses:   Acute on chronic systolic congestive heart failure  Anemia  Essential hypertension, benign  Sinoatrial node dysfunction  PPM-St.Jude  Atrial fibrillation  Long term (current) use of anticoagulants  Cardiomyopathy, secondary  CAD (coronary artery disease)  Nonischemic cardiomyopathy  Lower extremity edema  Acute on chronic systolic CHF (congestive heart failure)  GERD (gastroesophageal reflux disease)  Hypokalemia   Discharge Condition: Improved.   Diet recommendation: [employer] Healthy.   Filed Weights   01/23/12 0456 01/24/12 0515 01/25/12 0410  Weight: 85.322 kg (188 lb 1.6 oz) 82.509 kg (181 lb 14.4 oz) 81.058 kg (178 lb 11.2 oz)    History of present illness:  [name] is a 78 y.o. African American female with history of hypertension, A. fib on chronic anticoagulation, pacemaker, mild nonobstructive coronary artery disease by left heart catheter April 2013, hypertension, and GERD who presents with the above complaints. Patient reports that her symptoms started about 3-4 weeks ago when she noticed shortness of breath with exertion. Usually she is very active at over the last few weeks walking to the mailbox which is 100 feet would cause her to be short of breath. She has seen her primary care physician who checked her hemoglobin which came back at 7.7 as a result she was directed to the emergency department for further evaluation. Also in the last 4-5 days she is noted worsening edema in lower extremities  bilaterally. She denies any fevers, chills, nausea, vomiting, chest pain, shortness of breath only with exertion, abdominal pain, diarrhea, headaches or vision changes. Has not noted any blood in stools or black stools.    Hospital Course:  1-Acute on chronic systolic congestive heart failure;  Patient relates improvement of SOB. Negative 4 L. Weight 85 kg on admission decrease to 81.  Cardiac enzymes negative. She received  lasix to 40 mg IV Q 8  Hours then Q 12. Cr increase from baseline, will discharge patient on home dose lasix. She will need B-met 12-18, arrange follow up. She will need adjustment of lasix as needed. I advised her to take half tablet extra for increase weight. She is feeling much better. SOB on exertion improved. . Dicontinue with irbesartan due to mild increase Cr.   2-Anemia; SOB improved after blood transfusion.  Iron: 20, Ferritin 13. Discharge on  Ferrous sulfate. S/P 1 unit PRBC.Occult blood negative. She will need referral to gastroenterologist. FU with PCP for repeat HB.   3-Atrial fibrillation  Rate controlled. Continue with coreg, Xarelto.  4-Hypertension  Continue carvedilol. Hold Irbesartan. Agree with stopping Amlodipine.  5-Hyperlipidemia  Continue statin.  6-Hypokalemia  Resolved. Magnesium at 1.9.  7-GERD  Continue PPI.  8-Prophylaxis  Rivaroxaban.    Procedures:  None.  Consultations:  None.  Discharge Exam: Filed Vitals:   01/24/12 1840 01/24/12 1940 01/24/12 2205 01/25/12 0410  BP: 117/60 105/64 107/64 99/57  Pulse: 70 69 72 76  Temp: 98.1 F (36.7 C) 97.5 F (36.4 C) 97.4 F (36.3 C) 98.3 F (36.8 C)  TempSrc: Oral Oral Oral Oral  Resp: 20 20 20  20  Height:      Weight:    81.058 kg (178 lb 11.2 oz)  SpO2:   100% 97%    General: No distress. Cardiovascular: S 1, S 2 RRR Respiratory: CTA.  Discharge Instructions  Discharge Orders    Future Appointments: Provider: Department: Dept Phone: Center:   01/27/2012 8:25 AM  [facility] Main Office [place]) [phone] [email or web address]   02/09/2012 3:45 PM Marinus Maw, MD [facility] Main Office [place]) [phone] [email or web address]   03/21/2012 10:20 AM [facility] Main Office [place]) [phone] [facility]     Future Orders Please Complete By Expires   Diet - low sodium heart healthy      Increase activity slowly      Discharge instructions      Comments:   Follow up for lab work 12-18 Dr Ladona Ridgel office. From 8 am to 4;30 pm Appointment; 12-31 with Dr Ladona Ridgel.       Medication List     As of 01/25/2012 10:38 AM    STOP taking these medications         amLODipine-valsartan 10-320 MG per tablet   Commonly known as: EXFORGE      TAKE these medications         acetaminophen 500 MG tablet   Commonly known as: TYLENOL   Take 1 tablet (500 mg total) by mouth every 6 (six) hours as needed. Joint pain      aspirin EC 81 MG tablet   Take 81 mg by mouth every morning.      atorvastatin 20 MG tablet   Commonly known as: LIPITOR   Take 20 mg by mouth every morning.      carvedilol 25 MG tablet   Commonly known as: COREG   Take 25 mg by mouth 2 (two) times daily with a meal.      ergocalciferol 50000 UNITS capsule   Commonly known as: VITAMIN D2   Take 50,000 Units by mouth every Wednesday.      ferrous sulfate 325 (65 FE) MG tablet   Take 1 tablet (325 mg total) by mouth 3 (three) times daily with meals.      furosemide 40 MG tablet   Commonly known as: LASIX   Take 40 mg by mouth every morning.      lansoprazole 15 MG capsule   Commonly known as: PREVACID   Take 15 mg by mouth every morning.      nitroGLYCERIN 0.4 MG SL tablet   Commonly known as: NITROSTAT   Place 1 tablet (0.4 mg total) under the tongue every 5 (five) minutes as needed for chest pain.      potassium chloride 10 MEQ tablet   Commonly known as: K-DUR   Take 1 tablet (10 mEq total) by mouth 2 (two) times daily.       Rivaroxaban 20 MG Tabs   Commonly known as: XARELTO   Take 20 mg by mouth daily with supper.      traMADol 50 MG tablet   Commonly known as: ULTRAM   Take 100 mg by mouth 4 (four) times daily as needed. Joint pain           Follow-up Information    Follow up with Lewayne Bunting, MD. (Appointment 12-31 at 3;45 pm. Lab work 12-18 )    Contact information:   [address] [place] [address] [phone]           The results  of significant diagnostics from this hospitalization (including imaging, microbiology, ancillary and laboratory) are listed below for reference.    Significant Diagnostic Studies: Dg Chest 2 View  01/22/2012  *RADIOLOGY REPORT*  Clinical Data: Limb swelling, cardiomyopathy  CHEST - 2 VIEW  Comparison: 02/17/2010  Findings: Mild right upper lobe/left perihilar opacity, favored to reflect mild interstitial edema.  Infection is considered less likely.  Cardiomegaly.  Left subclavian pacemaker.  Degenerative changes of the visualized thoracolumbar spine.  IMPRESSION: Cardiomegaly with suspected mild interstitial edema.   Original Report Authenticated By: Charline Bills, M.D.     Microbiology: No results found for this or any previous visit (from the past 240 hour(s)).   Labs: Basic Metabolic Panel:  Lab 01/25/12 9147 01/24/12 0550 01/23/12 0515 01/22/12 1245  NA 135 137 138 138  K 4.4 4.2 3.9 3.1*  CL 97 98 99 100  CO2 30 29 27 26   GLUCOSE 107* 96 83 105*  BUN 19 15 11 11   CREATININE 1.31* 1.24* 0.96 1.00  CALCIUM 9.7 10.1 9.8 9.7  MG -- -- 1.9 --  PHOS -- -- -- --   CBC:  Lab 01/25/12 0420 01/24/12 0550 01/23/12 0515 01/22/12 1245  WBC 6.1 4.6 4.7 4.9  NEUTROABS -- -- -- 3.2  HGB 9.0* 8.2* 8.1* 8.1*  HCT 29.6* 27.3* 26.6* 26.7*  MCV 70.8* 70.0* 68.7* 69.7*  PLT 267 267 267 292   Cardiac Enzymes:  Lab 01/23/12 0025 01/22/12 1820 01/22/12 1323  CKTOTAL -- -- --  CKMB -- -- --  CKMBINDEX -- -- --  TROPONINI <0.30  <0.30 <0.30   BNP: BNP (last 3 results)  Basename 01/24/12 0550 01/22/12 1245  PROBNP 2122.0* 2781.0*   CBG: No results found for this basename: GLUCAP:5 in the last 168 hours     Signed:  Kamau Weatherall  [facility] 01/25/2012, 10:38 AM

## 2012-01-26 LAB — FERRITIN: Ferritin: 13 ng/mL (ref 10–291)

## 2012-01-27 ENCOUNTER — Ambulatory Visit (INDEPENDENT_AMBULATORY_CARE_PROVIDER_SITE_OTHER): Payer: Medicare Other | Admitting: *Deleted

## 2012-01-27 DIAGNOSIS — I4891 Unspecified atrial fibrillation: Secondary | ICD-10-CM

## 2012-01-27 LAB — BASIC METABOLIC PANEL
BUN: 20 mg/dL (ref 6–23)
Chloride: 102 mEq/L (ref 96–112)
Glucose, Bld: 97 mg/dL (ref 70–99)
Potassium: 4.5 mEq/L (ref 3.5–5.1)

## 2012-02-09 ENCOUNTER — Encounter: Payer: Self-pay | Admitting: Internal Medicine

## 2012-02-09 ENCOUNTER — Encounter: Payer: Self-pay | Admitting: *Deleted

## 2012-02-09 ENCOUNTER — Ambulatory Visit (INDEPENDENT_AMBULATORY_CARE_PROVIDER_SITE_OTHER): Payer: Medicare Other | Admitting: Internal Medicine

## 2012-02-09 VITALS — BP 141/67 | HR 71 | Ht 71.0 in | Wt 196.0 lb

## 2012-02-09 DIAGNOSIS — I429 Cardiomyopathy, unspecified: Secondary | ICD-10-CM

## 2012-02-09 DIAGNOSIS — I4891 Unspecified atrial fibrillation: Secondary | ICD-10-CM

## 2012-02-09 DIAGNOSIS — I5022 Chronic systolic (congestive) heart failure: Secondary | ICD-10-CM

## 2012-02-09 DIAGNOSIS — Z950 Presence of cardiac pacemaker: Secondary | ICD-10-CM

## 2012-02-09 LAB — PACEMAKER DEVICE OBSERVATION
BATTERY VOLTAGE: 2.9779 V
BRDY-0002RV: 70 {beats}/min
DEVICE MODEL PM: 7203906
VENTRICULAR PACING PM: 99

## 2012-02-09 LAB — BASIC METABOLIC PANEL
CO2: 24 mEq/L (ref 19–32)
Calcium: 9.8 mg/dL (ref 8.4–10.5)
Chloride: 103 mEq/L (ref 96–112)
Potassium: 3.5 mEq/L (ref 3.5–5.3)
Sodium: 134 mEq/L — ABNORMAL LOW (ref 135–145)

## 2012-02-09 NOTE — Patient Instructions (Signed)
Bi-V Pacemaker on 02/19/12  See instuction sheet

## 2012-02-10 ENCOUNTER — Encounter: Payer: Self-pay | Admitting: Internal Medicine

## 2012-02-10 LAB — CBC WITH DIFFERENTIAL/PLATELET
Basophils Relative: 1 % (ref 0–1)
HCT: 33 % — ABNORMAL LOW (ref 36.0–46.0)
Hemoglobin: 10.4 g/dL — ABNORMAL LOW (ref 12.0–15.0)
MCHC: 31.5 g/dL (ref 30.0–36.0)
MCV: 76.2 fL — ABNORMAL LOW (ref 78.0–100.0)
Monocytes Absolute: 0.6 10*3/uL (ref 0.1–1.0)
Monocytes Relative: 9 % (ref 3–12)
Neutro Abs: 4 10*3/uL (ref 1.7–7.7)

## 2012-02-10 NOTE — Assessment & Plan Note (Signed)
She is currently maintaining NSR. No change in medical therapy.

## 2012-02-10 NOTE — Progress Notes (Signed)
HPI [name] returns today for followup. She is a 79 yo woman with a h/o chronic systolic CHF, CHB, s/p PPM, CAD, and dyslipidemia. She was recently hospitalized with acute on chronic systolic CHF. Her symptoms have progressed and she is now class 3. She denies syncope.  No Known Allergies   Current Outpatient Prescriptions  Medication Sig Dispense Refill  . acetaminophen (TYLENOL) 500 MG tablet Take 1 tablet (500 mg total) by mouth every 6 (six) hours as needed. Joint pain  30 tablet  0  . aspirin EC 81 MG tablet Take 81 mg by mouth every morning.       . atorvastatin (LIPITOR) 20 MG tablet Take 20 mg by mouth every morning.      . carvedilol (COREG) 25 MG tablet Take 25 mg by mouth 2 (two) times daily with a meal.        . ergocalciferol (VITAMIN D2) 50000 UNITS capsule Take 50,000 Units by mouth every Wednesday.       . ferrous sulfate 325 (65 FE) MG tablet Take 1 tablet (325 mg total) by mouth 3 (three) times daily with meals.  30 tablet  0  . furosemide (LASIX) 40 MG tablet Take 40 mg by mouth every morning.      . lansoprazole (PREVACID) 15 MG capsule Take 15 mg by mouth every morning.       . nitroGLYCERIN (NITROSTAT) 0.4 MG SL tablet Place 1 tablet (0.4 mg total) under the tongue every 5 (five) minutes as needed for chest pain.  25 tablet  3  . potassium chloride (K-DUR) 10 MEQ tablet Take 1 tablet (10 mEq total) by mouth 2 (two) times daily.  30 tablet  0  . Rivaroxaban (XARELTO) 20 MG TABS Take 20 mg by mouth daily with supper.       . traMADol (ULTRAM) 50 MG tablet Take 100 mg by mouth 4 (four) times daily as needed. Joint pain         Past Medical History  Diagnosis Date  . HTN (hypertension)   . Hernia     Incisional hernia, umbilicus.  . H/O: hysterectomy   . Bradycardia     s/p pacer (Tenant => GT)  . Atrial fibrillation   . Depression   . Goiter   . DJD (degenerative joint disease)   . GERD (gastroesophageal reflux disease)   . Diverticulosis   . Cardiomyopathy  secondary     Myoview 3/13: small inf scar apex to base with mild peri-infarct isch; EF 32%;   LHC 05/12/11: pLAD 20%, pCFX 20%, mid 30%, m+dRCA with mild luminal irregs; EF 35% with mid inf wal and inf-apical HK(no culprit for segmental WMA)  . CAD (coronary artery disease)     nonobs by [facility] 4/13    ROS:   All systems reviewed and negative except as noted in the HPI.   Past Surgical History  Procedure Date  . Pacemaker placement , 2002.  . Cholecystectomy      Family History  Problem Relation Age of Onset  . Adopted: Yes  . Coronary artery disease    . Other      stomach carcinoma  . Stomach cancer Other   . Coronary artery disease Other      History   Social History  . Marital Status: Married    Spouse Name: N/A    Number of Children: N/A  . Years of Education: N/A   Occupational History  . Not on file.     Social History Main Topics  . Smoking status: Former Smoker    Types: Cigarettes  . Smokeless tobacco: Former User    Quit date: 01/21/2009  . Alcohol Use: No  . Drug Use: Not on file  . Sexually Active: No   Other Topics Concern  . Not on file   Social History Narrative    Married, lives with her husband in a one-level home, three steps to enter, moved from [place] three years ago due to her husband's retirement and his local roots being from [place].     BP 141/67  Pulse 71  Ht 5' 11" (1.803 m)  Wt 196 lb (88.905 kg)  BMI 27.34 kg/m2  Physical Exam:  elderly appearing 79 yo woman, NAD HEENT: Unremarkable Neck:  No JVD, no thyromegally Lungs:  Clear with no wheezes, rales, or rhonchi HEART:  Regular rate rhythm, no murmurs, no rubs, no clicks Abd:  soft, positive bowel sounds, no organomegally, no rebound, no guarding Ext:  2 plus pulses, no edema, no cyanosis, no clubbing Skin:  No rashes no nodules Neuro:  CN II through XII intact, motor grossly intact  DEVICE  Normal device function.  See PaceArt for details.   Assess/Plan:  

## 2012-02-10 NOTE — Assessment & Plan Note (Signed)
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 I have discussed the risks/benefits/goals/expectations of biv upgrade and she wishes to proceed. This will be scheduled at the earliest possible convenient time.

## 2012-02-10 NOTE — Assessment & Plan Note (Signed)
Her St.Jude PPM is working normally. Will plan Biv upgrade in the next few weeks.

## 2012-02-11 ENCOUNTER — Encounter (HOSPITAL_COMMUNITY): Payer: Self-pay | Admitting: Respiratory Therapy

## 2012-02-12 ENCOUNTER — Other Ambulatory Visit: Payer: Self-pay | Admitting: *Deleted

## 2012-02-12 DIAGNOSIS — I509 Heart failure, unspecified: Secondary | ICD-10-CM

## 2012-02-18 MED ORDER — CEFAZOLIN SODIUM-DEXTROSE 2-3 GM-% IV SOLR
2.0000 g | INTRAVENOUS | Status: DC
  Filled 2012-02-18 (×2): qty 50

## 2012-02-18 MED ORDER — GENTAMICIN SULFATE 40 MG/ML IJ SOLN
80.0000 mg | INTRAMUSCULAR | Status: DC
  Filled 2012-02-18: qty 2

## 2012-02-19 ENCOUNTER — Ambulatory Visit (HOSPITAL_COMMUNITY)
Admission: RE | Admit: 2012-02-19 | Discharge: 2012-02-20 | Disposition: A | Discharge status: Home / Self Care | Payer: Medicare Other | Source: Ambulatory Visit | Attending: Internal Medicine | Admitting: Internal Medicine

## 2012-02-19 ENCOUNTER — Encounter (HOSPITAL_COMMUNITY): Payer: Self-pay | Admitting: *Deleted

## 2012-02-19 ENCOUNTER — Encounter (HOSPITAL_COMMUNITY)
Admission: RE | Disposition: A | Discharge status: Home / Self Care | Payer: Self-pay | Source: Ambulatory Visit | Attending: Internal Medicine

## 2012-02-19 DIAGNOSIS — I2510 Atherosclerotic heart disease of native coronary artery without angina pectoris: Secondary | ICD-10-CM

## 2012-02-19 DIAGNOSIS — I4891 Unspecified atrial fibrillation: Secondary | ICD-10-CM

## 2012-02-19 DIAGNOSIS — I429 Cardiomyopathy, unspecified: Secondary | ICD-10-CM

## 2012-02-19 DIAGNOSIS — I5022 Chronic systolic (congestive) heart failure: Secondary | ICD-10-CM

## 2012-02-19 DIAGNOSIS — Z950 Presence of cardiac pacemaker: Secondary | ICD-10-CM

## 2012-02-19 DIAGNOSIS — Z45018 Encounter for adjustment and management of other part of cardiac pacemaker: Secondary | ICD-10-CM | POA: Insufficient documentation

## 2012-02-19 DIAGNOSIS — I509 Heart failure, unspecified: Secondary | ICD-10-CM

## 2012-02-19 DIAGNOSIS — R079 Chest pain, unspecified: Secondary | ICD-10-CM

## 2012-02-19 DIAGNOSIS — Z7902 Long term (current) use of antithrombotics/antiplatelets: Secondary | ICD-10-CM | POA: Insufficient documentation

## 2012-02-19 DIAGNOSIS — E785 Hyperlipidemia, unspecified: Secondary | ICD-10-CM | POA: Insufficient documentation

## 2012-02-19 DIAGNOSIS — Z9889 Other specified postprocedural states: Secondary | ICD-10-CM | POA: Insufficient documentation

## 2012-02-19 DIAGNOSIS — I447 Left bundle-branch block, unspecified: Secondary | ICD-10-CM

## 2012-02-19 HISTORY — DX: Atrioventricular block, complete: I44.2

## 2012-02-19 HISTORY — DX: Personal history of other diseases of the digestive system: Z87.19

## 2012-02-19 HISTORY — DX: Unspecified systolic (congestive) heart failure: I50.20

## 2012-02-19 HISTORY — DX: Presence of cardiac pacemaker: Z95.0

## 2012-02-19 HISTORY — DX: Left bundle-branch block, unspecified: I44.7

## 2012-02-19 HISTORY — PX: BI-VENTRICULAR PACEMAKER UPGRADE: SHX5464

## 2012-02-19 LAB — SURGICAL PCR SCREEN
MRSA, PCR: NEGATIVE
Staphylococcus aureus: NEGATIVE

## 2012-02-19 SURGERY — BI-VENTRICULAR PACEMAKER UPGRADE
Anesthesia: LOCAL

## 2012-02-19 MED ORDER — SODIUM CHLORIDE 0.9 % IJ SOLN: 3.0000 mL | Freq: Two times a day (BID) | INTRAMUSCULAR | Status: DC

## 2012-02-19 MED ORDER — HEPARIN (PORCINE) IN NACL 2-0.9 UNIT/ML-% IJ SOLN
INTRAMUSCULAR | Status: AC
  Filled 2012-02-19: qty 500

## 2012-02-19 MED ORDER — CARVEDILOL 25 MG PO TABS
25.0000 mg | ORAL_TABLET | Freq: Two times a day (BID) | ORAL | Status: DC
  Administered 2012-02-19 – 2012-02-20 (×2): 25 mg via ORAL
  Filled 2012-02-19 (×4): qty 1

## 2012-02-19 MED ORDER — LIDOCAINE HCL (PF) 1 % IJ SOLN
INTRAMUSCULAR | Status: AC
  Filled 2012-02-19: qty 60

## 2012-02-19 MED ORDER — CHLORHEXIDINE GLUCONATE 4 % EX LIQD: 60.0000 mL | Freq: Once | CUTANEOUS | Status: DC

## 2012-02-19 MED ORDER — ACETAMINOPHEN-CODEINE #3 300-30 MG PO TABS
1.0000 | ORAL_TABLET | ORAL | Status: DC | PRN
  Administered 2012-02-19 – 2012-02-20 (×3): 2 via ORAL
  Filled 2012-02-19 (×3): qty 2

## 2012-02-19 MED ORDER — SODIUM CHLORIDE 0.9 % IJ SOLN: 3.0000 mL | INTRAMUSCULAR | Status: DC | PRN

## 2012-02-19 MED ORDER — FUROSEMIDE 40 MG PO TABS
40.0000 mg | ORAL_TABLET | Freq: Every morning | ORAL | Status: DC
  Administered 2012-02-20: 10:00:00 40 mg via ORAL
  Filled 2012-02-19: qty 1

## 2012-02-19 MED ORDER — ATORVASTATIN CALCIUM 20 MG PO TABS
20.0000 mg | ORAL_TABLET | Freq: Every morning | ORAL | Status: DC
  Administered 2012-02-20: 20 mg via ORAL
  Filled 2012-02-19: qty 1

## 2012-02-19 MED ORDER — MIDAZOLAM HCL 5 MG/5ML IJ SOLN
INTRAMUSCULAR | Status: AC
  Filled 2012-02-19: qty 5

## 2012-02-19 MED ORDER — NITROGLYCERIN 0.4 MG SL SUBL: 0.4000 mg | SUBLINGUAL_TABLET | SUBLINGUAL | Status: DC | PRN

## 2012-02-19 MED ORDER — POTASSIUM CHLORIDE ER 10 MEQ PO TBCR
10.0000 meq | EXTENDED_RELEASE_TABLET | Freq: Two times a day (BID) | ORAL | Status: DC
  Administered 2012-02-19 – 2012-02-20 (×2): 10 meq via ORAL
  Filled 2012-02-19 (×4): qty 1

## 2012-02-19 MED ORDER — FENTANYL CITRATE 0.05 MG/ML IJ SOLN
INTRAMUSCULAR | Status: AC
  Filled 2012-02-19: qty 2

## 2012-02-19 MED ORDER — RIVAROXABAN 20 MG PO TABS
20.0000 mg | ORAL_TABLET | Freq: Every day | ORAL | Status: DC
  Administered 2012-02-19: 19:00:00 20 mg via ORAL
  Filled 2012-02-19 (×2): qty 1

## 2012-02-19 MED ORDER — TRAMADOL HCL 50 MG PO TABS
100.0000 mg | ORAL_TABLET | Freq: Four times a day (QID) | ORAL | Status: DC | PRN
  Administered 2012-02-19 – 2012-02-20 (×3): 100 mg via ORAL
  Filled 2012-02-19 (×4): qty 2

## 2012-02-19 MED ORDER — ONDANSETRON HCL 4 MG/2ML IJ SOLN: 4.0000 mg | Freq: Four times a day (QID) | INTRAMUSCULAR | Status: DC | PRN

## 2012-02-19 MED ORDER — SODIUM CHLORIDE 0.45 % IV SOLN
INTRAVENOUS | Status: DC
  Administered 2012-02-19: 13:00:00 via INTRAVENOUS

## 2012-02-19 MED ORDER — SODIUM CHLORIDE 0.9 % IV SOLN: 250.0000 mL | INTRAVENOUS | Status: DC

## 2012-02-19 MED ORDER — CEFAZOLIN SODIUM-DEXTROSE 2-3 GM-% IV SOLR
2.0000 g | Freq: Four times a day (QID) | INTRAVENOUS | Status: AC
  Administered 2012-02-19 – 2012-02-20 (×3): 2 g via INTRAVENOUS
  Filled 2012-02-19 (×3): qty 50

## 2012-02-19 MED ORDER — MUPIROCIN 2 % EX OINT
TOPICAL_OINTMENT | CUTANEOUS | Status: AC
  Filled 2012-02-19: qty 22

## 2012-02-19 MED ORDER — ASPIRIN EC 81 MG PO TBEC
81.0000 mg | DELAYED_RELEASE_TABLET | Freq: Every morning | ORAL | Status: DC
  Administered 2012-02-20: 10:00:00 81 mg via ORAL
  Filled 2012-02-19: qty 1

## 2012-02-19 MED ORDER — MUPIROCIN 2 % EX OINT
TOPICAL_OINTMENT | Freq: Two times a day (BID) | CUTANEOUS | Status: DC
  Administered 2012-02-19: 1 via NASAL

## 2012-02-19 MED ORDER — YOU HAVE A PACEMAKER BOOK
Freq: Once | Status: AC
  Administered 2012-02-19: 22:00:00
  Filled 2012-02-19: qty 1

## 2012-02-19 MED ORDER — ACETAMINOPHEN 325 MG PO TABS: 325.0000 mg | ORAL_TABLET | ORAL | Status: DC | PRN

## 2012-02-19 NOTE — Interval H&P Note (Signed)
History and Physical Interval Note:  02/19/2012 1:21 PM  [name]  has presented today for surgery, with the diagnosis of lv dysfunction  The various methods of treatment have been discussed with the patient and family. After consideration of risks, benefits and other options for treatment, the patient has consented to  Procedure(s) (LRB) with comments: BI-VENTRICULAR PACEMAKER UPGRADE (N/A) as a surgical intervention .  The patient's history has been reviewed, patient examined, no change in status, stable for surgery.  I have reviewed the patient's chart and labs.  Questions were answered to the patient's satisfaction.     Leonia Reeves.D.

## 2012-02-19 NOTE — H&P (View-Only) (Signed)
HPI [name] returns today for followup. She is a 79 yo woman with a h/o chronic systolic CHF, CHB, s/p PPM, CAD, and dyslipidemia. She was recently hospitalized with acute on chronic systolic CHF. Her symptoms have progressed and she is now class 3. She denies syncope.  No Known Allergies   Current Outpatient Prescriptions  Medication Sig Dispense Refill  . acetaminophen (TYLENOL) 500 MG tablet Take 1 tablet (500 mg total) by mouth every 6 (six) hours as needed. Joint pain  30 tablet  0  . aspirin EC 81 MG tablet Take 81 mg by mouth every morning.       [name] atorvastatin (LIPITOR) 20 MG tablet Take 20 mg by mouth every morning.      . carvedilol (COREG) 25 MG tablet Take 25 mg by mouth 2 (two) times daily with a meal.        . ergocalciferol (VITAMIN D2) 50000 UNITS capsule Take 50,000 Units by mouth every Wednesday.       . ferrous sulfate 325 (65 FE) MG tablet Take 1 tablet (325 mg total) by mouth 3 (three) times daily with meals.  30 tablet  0  . furosemide (LASIX) 40 MG tablet Take 40 mg by mouth every morning.      . lansoprazole (PREVACID) 15 MG capsule Take 15 mg by mouth every morning.       . nitroGLYCERIN (NITROSTAT) 0.4 MG SL tablet Place 1 tablet (0.4 mg total) under the tongue every 5 (five) minutes as needed for chest pain.  25 tablet  3  . potassium chloride (K-DUR) 10 MEQ tablet Take 1 tablet (10 mEq total) by mouth 2 (two) times daily.  30 tablet  0  . Rivaroxaban (XARELTO) 20 MG TABS Take 20 mg by mouth daily with supper.       . traMADol (ULTRAM) 50 MG tablet Take 100 mg by mouth 4 (four) times daily as needed. Joint pain         Past Medical History  Diagnosis Date  . HTN (hypertension)   . Hernia     Incisional hernia, umbilicus.  . H/O: hysterectomy   . Bradycardia     s/p pacer (Tenant => GT)  . Atrial fibrillation   . Depression   . Goiter   . DJD (degenerative joint disease)   . GERD (gastroesophageal reflux disease)   . Diverticulosis   . Cardiomyopathy  secondary     Myoview 3/13: small inf scar apex to base with mild peri-infarct isch; EF 32%;   LHC 05/12/11: pLAD 20%, pCFX 20%, mid 30%, m+dRCA with mild luminal irregs; EF 35% with mid inf wal and inf-apical HK(no culprit for segmental WMA)  . CAD (coronary artery disease)     nonobs by [facility] 4/13    ROS:   All systems reviewed and negative except as noted in the HPI.   Past Surgical History  Procedure Date  . Pacemaker placement , 2002.  [name] Cholecystectomy      Family History  Problem Relation Age of Onset  . Adopted: Yes  . Coronary artery disease    . Other      stomach carcinoma  . Stomach cancer Other   . Coronary artery disease Other      History   Social History  . Marital Status: Married    Spouse Name: N/A    Number of Children: N/A  . Years of Education: N/A   Occupational History  . Not on file.  Social History Main Topics  . Smoking status: Former Smoker    Types: Cigarettes  . Smokeless tobacco: Former Neurosurgeon    Quit date: 01/21/2009  . Alcohol Use: No  . Drug Use: Not on file  . Sexually Active: No   Other Topics Concern  . Not on file   Social History Narrative    Married, lives with her husband in a one-level home, three steps to enter, moved from [place] three years ago due to her husband's retirement and his local roots being from [place].     BP 141/67  Pulse 71  Ht 5\' 11"  (1.803 m)  Wt 196 lb (88.905 kg)  BMI 27.34 kg/m2  Physical Exam:  elderly appearing 79 yo woman, NAD HEENT: Unremarkable Neck:  No JVD, no thyromegally Lungs:  Clear with no wheezes, rales, or rhonchi HEART:  Regular rate rhythm, no murmurs, no rubs, no clicks Abd:  soft, positive bowel sounds, no organomegally, no rebound, no guarding Ext:  2 plus pulses, no edema, no cyanosis, no clubbing Skin:  No rashes no nodules Neuro:  CN II through XII intact, motor grossly intact  DEVICE  Normal device function.  See PaceArt for details.   Assess/Plan:

## 2012-02-19 NOTE — Op Note (Signed)
BiV PPM upgrade without immediate complication. Y#[id number].

## 2012-02-20 ENCOUNTER — Encounter (HOSPITAL_COMMUNITY): Payer: Self-pay | Admitting: Cardiology

## 2012-02-20 ENCOUNTER — Ambulatory Visit (HOSPITAL_COMMUNITY): Payer: Medicare Other

## 2012-02-20 DIAGNOSIS — I4891 Unspecified atrial fibrillation: Secondary | ICD-10-CM

## 2012-02-20 DIAGNOSIS — I509 Heart failure, unspecified: Secondary | ICD-10-CM

## 2012-02-20 MED ORDER — ACETAMINOPHEN-CODEINE #3 300-30 MG PO TABS: 1.0000 | ORAL_TABLET | ORAL | Status: DC | PRN

## 2012-02-20 NOTE — Progress Notes (Addendum)
Primary cardiologist: Dr. Lewayne Bunting       Cardiology Progress Note Patient Name: [name] Date of Encounter: 02/20/2012, 8:11 AM     Subjective  Feels well. No chest pain or sob. C/o left arm pain from procedure.    Objective   Telemetry: NSR V-Pacing  Medications: . aspirin EC  81 mg Oral q morning - 10a  . atorvastatin  20 mg Oral q morning - 10a  . carvedilol  25 mg Oral BID WC  . furosemide  40 mg Oral q morning - 10a  . potassium chloride  10 mEq Oral BID  . Rivaroxaban  20 mg Oral Q supper      Physical Exam: Temp:  [97.5 F (36.4 C)-98.1 F (36.7 C)] 97.7 F (36.5 C) (01/11 0801) Pulse Rate:  [70-107] 77  (01/11 0801) Resp:  [12-20] 12  (01/11 0801) BP: (123-155)/(69-99) 149/76 mmHg (01/11 0801) SpO2:  [97 %-100 %] 98 % (01/11 0801) Weight:  [186 lb (84.369 kg)-191 lb 9.3 oz (86.9 kg)] 191 lb 9.3 oz (86.9 kg) (01/11 0045)  General: Pleasant elderly black female, in no acute distress. Head: Normocephalic, atraumatic, sclera non-icteric, nares are without discharge.  Neck: Supple. Negative for carotid bruits or JVD Lungs: Clear bilaterally to auscultation without wheezes, rales, or rhonchi. Breathing is unlabored. Heart: RRR S1 S2 3/6 systolic apical murmur. no rubs, or gallops.  Abdomen: Soft, non-tender, non-distended with normoactive bowel sounds. No rebound/guarding. No obvious abdominal masses. Msk:  Strength and tone appear normal for age. Extremities: Left arm in sling. Pacemaker incision site with clean/dry dressing. No edema. No clubbing or cyanosis. Distal pedal pulses are intact and equal bilaterally. Neuro: Alert and oriented X 3. Moves all extremities spontaneously. Psych:  Responds to questions appropriately with a normal affect.   Intake/Output Summary (Last 24 hours) at 02/20/12 0811 Last data filed at 02/20/12 0543  Gross per 24 hour  Intake    630 ml  Output      0 ml  Net    630 ml    Labs: None  Radiology/Studies:    02/19/12 - PPM Upgrade See op note  02/20/2012 - CHEST - 2 VIEW Findings: Interval revision of a left subclavian pacer with three leads on today's exam.  Stable cardiomegaly without CHF or focal pneumonia.  No pneumothorax or effusion.  Degenerative changes of the spine with associated scoliosis.  Left shoulder advanced arthropathy as well.  No significant interval change.  IMPRESSION: Left pacer revision.  No pneumothorax or acute process.       Assessment and Plan   1. Chronic Systolic CHF w/ pacing induced LBBB, prolonged QRS, and worsening CHF symptoms: She underwent upgrade to BiV PPM yesterday. During the procedure there was dislodgement of the LV lead back into the midportion of the lateral vein and subsequent diaphragmatic stimulation so LV lead was not turned on.  Interrogation today revealed phrenic nerve stimulation with pacing so LV lead remains off. Plan is to have her come back as an outpatient for placement of epicardial lead by surgery. Euvolemic on exam. No shortness of breath. Cont meds as above.  2. H/o CHB s/p PPM: Upgrade as above  3. H/o Atrial Fibrillation: Maintaining NSR. Cont BB and Xarelto  Signed, HOPE, JESSICA PA-C  Attending note:  Patient seen and examined. Reviewed recent procedure note. Patient is clinically stable this morning, has already ambulated, and is ready to go home. She has mild pain at her pocket site, no unusual  swelling or bleeding. Device interrogation is noted in the hard chart, chest x-ray shows no pneumothorax. Of note, Dr. Lubertha Basque procedure report indicates dislodgment of the LV lead back into the midportion of the lateral vein with diaphragmatic stimulation noted, so this lead is not active at this time. Dr. Graciela Husbands and Dr. Ladona Ridgel have discussed the matter, and plan will be ultimately for the patient to come back in for epicardial lead placement by surgery. She will be contacted by our office early this week for the next step.  Jonelle Sidle, M.D., F.A.C.C.

## 2012-02-20 NOTE — Discharge Summary (Signed)
Please see also my rounding note from today. 

## 2012-02-20 NOTE — Plan of Care (Signed)
Problem: Phase II Progression Outcomes Goal: Pain controlled Outcome: Not Met (add Reason) Incisional pain present but helped with meds.  MD aware.

## 2012-02-20 NOTE — Discharge Summary (Signed)
Discharge Summary   Patient ID: [name] MRN: [id number], DOB/AGE: [birth year] 77 y.o.  Primary MD: [name], MD Primary Cardiologist: Dr. Ladona Ridgel Admit date: 02/19/2012 D/C date:     02/20/2012      Primary Discharge Diagnoses:  1. Chronic Systolic CHF w/ pacing induced LBBB, prolonged QRS, and worsening CHF symptoms  - s/p upgrade to Medtronic BiV PPM   - LV lead dislodged leading to diaphragmatic stimulation so LV lead turned off; plans for epicardial lead placement by surgery   Secondary Discharge Diagnoses:  . HTN (hypertension)   . Hernia     Incisional hernia, umbilicus.  . H/O: hysterectomy   . CHB (complete heart block)     s/p pacer (Tenant => GT)  . Atrial fibrillation     on xarleto  . Depression   . Goiter   . DJD (degenerative joint disease)   . GERD (gastroesophageal reflux disease)   . Diverticulosis   . Cardiomyopathy secondary     Myoview 3/13: small inf scar apex to base with mild peri-infarct isch; EF 32%;   LHC 05/12/11: pLAD 20%, pCFX 20%, mid 30%, m+dRCA with mild luminal irregs; EF 35% with mid inf wal and inf-apical HK(no culprit for segmental WMA)  . CAD (coronary artery disease)     nonobs by [facility] 4/13  . Systolic CHF   . H/O hiatal hernia   . LBBB (left bundle branch block)     Allergies No Known Allergies  Diagnostic Studies/Procedures:   02/19/12 - PPM Upgrade  See op note   History of Present Illness: 79 y.o. female w/ chronic systolic CHF and pacing induced LBBB, prolonged QRS, and worsening CHF symptoms and who presented to [facility] on 02/19/12 for planned BiV PPM upgrade.  Hospital Course:  She presented to [facility] in stable condition and underwent upgrade from PPM to BiV PPM. She tolerated the procedure well. During the procedure there was dislodgement of the LV lead back into the midportion of the lateral vein and subsequent diaphragmatic stimulation so LV lead was not turned on. Post procedure interrogation  revealed phrenic nerve stimulation so the LV lead remained off and plans were made for her to return for epicardial lead placement by surgery. Post procedure CXR was without pneumothorax or other acute findings. She had mild pain at her pocket site, but no unusual swelling or bleeding. She was able to ambulate without chest pain or shortness of breath. She was seen and evaluated by Dr. Diona Browner who felt she was stable for discharge home with plans for follow up as scheduled below.  Discharge Vitals: Blood pressure 149/76, pulse 77, temperature 97.7 F (36.5 C), temperature source Oral, resp. rate 12, height 5\' 11"  (1.803 m), weight 191 lb 9.3 oz (86.9 kg), SpO2 98.00%.  Labs: None  Discharge Medications     Medication List     As of 02/20/2012  9:29 AM    TAKE these medications         acetaminophen 500 MG tablet   Commonly known as: TYLENOL   Take 1 tablet (500 mg total) by mouth every 6 (six) hours as needed. Joint pain      acetaminophen-codeine 300-30 MG per tablet   Commonly known as: TYLENOL #3   Take 1-2 tablets by mouth every 4 (four) hours as needed for pain.      aspirin EC 81 MG tablet   Take 81 mg by mouth every morning.      atorvastatin  20 MG tablet   Commonly known as: LIPITOR   Take 20 mg by mouth every morning.      carvedilol 25 MG tablet   Commonly known as: COREG   Take 25 mg by mouth 2 (two) times daily with a meal.      ergocalciferol 50000 UNITS capsule   Commonly known as: VITAMIN D2   Take 50,000 Units by mouth every Wednesday.      ferrous sulfate 325 (65 FE) MG tablet   Take 1 tablet (325 mg total) by mouth 3 (three) times daily with meals.      furosemide 40 MG tablet   Commonly known as: LASIX   Take 40 mg by mouth every morning.      lansoprazole 15 MG capsule   Commonly known as: PREVACID   Take 15 mg by mouth every morning.      nitroGLYCERIN 0.4 MG SL tablet   Commonly known as: NITROSTAT   Place 1 tablet (0.4 mg total) under the  tongue every 5 (five) minutes as needed for chest pain.      potassium chloride 10 MEQ tablet   Commonly known as: K-DUR   Take 1 tablet (10 mEq total) by mouth 2 (two) times daily.      Rivaroxaban 20 MG Tabs   Commonly known as: XARELTO   Take 20 mg by mouth daily with supper.      traMADol 50 MG tablet   Commonly known as: ULTRAM   Take 100 mg by mouth 4 (four) times daily as needed. Joint pain          Disposition   Discharge Orders    Future Orders Please Complete By Expires   Diet - low sodium heart healthy      Increase activity slowly      Discharge instructions      Comments:   * Please take all medications as prescribed and bring them with you to your office visits  * You were prescribed Tylenol with Codeine for pain at your pacemaker site. Please do not take regular Tylenol or Tramadol while you are taking this.     Follow-up Information    Follow up with [facility]. In 1 week. (Device/Wound check; Our office will call you with your appointment time)    Contact information:   [address] [place] [address] [phone]       Follow up with Lewayne Bunting, MD. In 3 months. (Our office will call you with your appointment time)    Contact information:   [facility] [address] [place] [address] [phone]            Outstanding Labs/Studies:  None  Duration of Discharge Encounter: Greater than 30 minutes including physician and PA time.  Signed, Castor Gittleman PA-C 02/20/2012, 9:29 AM

## 2012-02-20 NOTE — Plan of Care (Signed)
Problem: Discharge Progression Outcomes Goal: Pain controlled with appropriate interventions Outcome: Not Met (add Reason) Reduced with meds; MD aware.

## 2012-02-20 NOTE — Op Note (Signed)
[name], [name]                ACCOUNT NO.:  [id number]  MEDICAL RECORD NO.:  [id number]  LOCATION:  6531                         FACILITY:  [facility]  PHYSICIAN:  Doylene Canning. Ladona Ridgel, MD    DATE OF BIRTH:  Apr 08, 1932  DATE OF PROCEDURE:  02/19/2012 DATE OF DISCHARGE:                              OPERATIVE REPORT   PROCEDURE PERFORMED:  Insertion of a new left ventricular lead, removal of an old dual-chamber pacemaker, and insertion of a new biventricular pacemaker with pacemaker pocket revision.  INTRODUCTION:  The patient is a 79 year old woman with a longstanding cardiomyopathy, chronic systolic heart failure, left bundle-branch block.  She has an ejection fraction of 30%.  She has an ischemic cardiomyopathy.  She has developed class III heart failure symptoms with pacing induced left bundle-branch block, and is now referred for upgrade to a biventricular pacemaker.  Her multiple comorbidities made her not thought to be a good candidate for ICD implantation.  PROCEDURE:  After informed was obtained, the patient was taken to the diagnostic EP lab in a fasting state.  After usual preparation and draping, intravenous fentanyl and midazolam was given for sedation.  30 mL of lidocaine was infiltrated into the left infraclavicular region. To determine whether her subclavian vein was patent or not, 10 mL of contrast was injected into the left upper extremity venous system demonstrating that in fact the vein was patent.  The vein was then punctured and the St. Jude guiding catheter along with a 6-French hexapolar EP catheter were advanced into the right atrium.  The coronary sinus was cannulated without difficulty.  Venography of the coronary sinus was then carried out.  It demonstrated a lateral vein which came off at a very sharp angle.  The vein was quite large.  The subselective catheter was then utilized to cannulate the vein and the Mailman guidewire was used to advance the St.  Jude  lead into the vein.  Mapping was carried out in the vein from base to apex.  Diaphragmatic stimulation was seen throughout.  In addition, the threshold tended to be high.  The Medtronic bipolar lead was then advanced into the lateral vein and mapping was again carried out.  The subselective branch was coursed more caudally, the LV threshold was reasonable, but the diaphragmatic stimulation was still present.  Out near the approximately 2/3rd distance from base to apex, diaphragmatic stimulation was not demonstrated and the lead was freed up from the guiding catheter in this location.  There was no diaphragmatic stimulation in this location, although the pacing threshold was less than 2 V.  The lead was liberated from the guiding catheter in the usual manner and secured to the subpectoral fascia with a figure-of-eight silk suture.  The sewing sleeve was also secured with silk suture.  At this point, a subpectoral pocket was fashioned with electrocautery.  Electrocautery was utilized to assure hemostasis.  The pocket was irrigated with antibiotic irrigation.  The new Medtronic model 4194 88 cm bipolar pacing lead, serial number [id number] was then connected to the LV lead as well as the old RV and atrial leads and placed back in the subcutaneous pocket. The device was  placed back in the subpectoral pocket.  It was noted that diaphragmatic stimulation was present.  Fluoroscopy was then carried out demonstrating that the LV lead had withdrawn back to a distance of approximately halfway from base to apex.  At this point, it was deemed most appropriate to leave the lead in position and hope that the thresholds improved and diaphragmatic stimulation resolved.  The incision was then closed with 2-0 and 3-0 Vicryl.  Benzoin and Steri- Strips were painted on the skin, pressure dressing applied, and the patient was returned to her room in satisfactory condition.  COMPLICATIONS:  There were no  immediate procedure complications.  RESULTS:  This demonstrates successful implantation of a St. Jude biventricular pacemaker with a new left ventricular lead.  There was dislodgement of the lead back into the midportion of the lateral vein and at the end of the case diaphragmatic stimulation was present.  It remains to be seen whether this patient will be able to pace the left ventricle from this position.     Doylene Canning. Ladona Ridgel, MD     GWT/MEDQ  D:  02/19/2012  T:  02/20/2012  Job:  [id number]

## 2012-02-22 NOTE — Progress Notes (Signed)
Utilization Review Completed.   Tao Satz, RN, BSN Nurse Case Manager  [phone]  

## 2012-02-24 ENCOUNTER — Telehealth: Payer: Self-pay | Admitting: Internal Medicine

## 2012-02-24 NOTE — Telephone Encounter (Signed)
Per after hours message pt needs to be called to set up for a lead placement

## 2012-02-25 NOTE — Telephone Encounter (Signed)
Discussed with Dr Ladona Ridgel patient needs a referral to Dr Lavinia Sharps for Epicardial LV lead placemnt

## 2012-02-26 ENCOUNTER — Other Ambulatory Visit: Payer: Self-pay | Admitting: *Deleted

## 2012-02-26 DIAGNOSIS — T82110A Breakdown (mechanical) of cardiac electrode, initial encounter: Secondary | ICD-10-CM

## 2012-02-26 DIAGNOSIS — I429 Cardiomyopathy, unspecified: Secondary | ICD-10-CM

## 2012-03-09 ENCOUNTER — Encounter: Payer: Self-pay | Admitting: Surgery

## 2012-03-09 ENCOUNTER — Other Ambulatory Visit: Payer: Self-pay | Admitting: *Deleted

## 2012-03-09 ENCOUNTER — Institutional Professional Consult (permissible substitution) (INDEPENDENT_AMBULATORY_CARE_PROVIDER_SITE_OTHER): Payer: Medicare Other | Admitting: Surgery

## 2012-03-09 VITALS — BP 137/89 | HR 86 | Resp 20 | Ht 71.0 in | Wt 200.0 lb

## 2012-03-09 DIAGNOSIS — I5022 Chronic systolic (congestive) heart failure: Secondary | ICD-10-CM

## 2012-03-09 DIAGNOSIS — I509 Heart failure, unspecified: Secondary | ICD-10-CM

## 2012-03-09 NOTE — Progress Notes (Signed)
                  [address]            [place] [address]          [phone]      PCP is ZANARD, ROBYN, MD  Referring Provider is Taylor, Gregg W, MD  Chief Complaint   Patient presents with   .  Congestive Heart Failure     Referral from Dr Taylor for surgical eval for Epic Cardial LV Lead placement   HPI:  The patient is a 79-year-old woman with ischemic cardiomyopathy who underwent insertion of a permanent pacemaker in 2003 for symptomatic bradycardia with complete heart block. She subsequently underwent generator change on 02/17/2010 when she reached beyond the ERI. She has now developed class III congestive heart failure symptoms with exertional dyspnea, fatigue, and lower extremity edema. She underwent insertion of a new left ventricular lead, removal of her old dual-chamber pacemaker generator, and insertion of a new biventricular pacemaker on 02/19/2012. Unfortunately, the left ventricular lead was causing diaphragmatic stimulation which persisted despite changing it to multiple locations. The lead was left in place with the hope that it would subsequently seat itself in a location that would not cause diaphragmatic pacing. This has not happened and it has continued to cause diaphragmatic pacing when tested. She was therefore referred for consideration of placing a left ventricular epicardial lead by a left thoracotomy approach.  Past Medical History   Diagnosis  Date   .  HTN (hypertension)    .  Hernia      Incisional hernia, umbilicus.   .  H/O: hysterectomy    .  CHB (complete heart block)      s/p pacer (Tenant => GT)   .  Atrial fibrillation      on xarleto   .  Depression    .  Goiter    .  DJD (degenerative joint disease)    .  GERD (gastroesophageal reflux disease)    .  Diverticulosis    .  Cardiomyopathy secondary      Myoview 3/13: small inf scar apex to base with mild peri-infarct isch; EF 32%; LHC 05/12/11: pLAD 20%, pCFX 20%, mid 30%, m+dRCA  with mild luminal irregs; EF 35% with mid inf wal and inf-apical HK(no culprit for segmental WMA)   .  CAD (coronary artery disease)      nonobs by LHC 4/13   .  Systolic CHF    .  Pacemaker      upgrade to BiV PPM 02/19/12 (LV lead off)   .  H/O hiatal hernia    .  LBBB (left bundle branch block)     Past Surgical History   Procedure  Date   .  Pacemaker placement  , 2002.   .  Cholecystectomy    .  Insert / replace / remove pacemaker    .  Abdominal hysterectomy    .  Tubal ligation    .  Small intestine surgery     Family History   Problem  Relation  Age of Onset   .  Adopted: Yes   .  Coronary artery disease     .  Other        stomach carcinoma    .  Stomach cancer  Other    .  Coronary artery disease  Other    .  Cancer  Mother      Social History  History   Substance Use Topics   .  Smoking status:  Former Smoker     Types:  Cigarettes     Quit date:  01/21/2009   .  Smokeless tobacco:  Former User     Quit date:  01/21/2009   .  Alcohol Use:  No    Current Outpatient Prescriptions   Medication  Sig  Dispense  Refill   .  acetaminophen (TYLENOL) 500 MG tablet  Take 1 tablet (500 mg total) by mouth every 6 (six) hours as needed. Joint pain  30 tablet  0   .  aspirin EC 81 MG tablet  Take 81 mg by mouth every morning.     .  atorvastatin (LIPITOR) 20 MG tablet  Take 20 mg by mouth every morning.     .  carvedilol (COREG) 25 MG tablet  Take 25 mg by mouth 2 (two) times daily with a meal.     .  ergocalciferol (VITAMIN D2) 50000 UNITS capsule  Take 50,000 Units by mouth every Wednesday.     .  ferrous sulfate 325 (65 FE) MG tablet  Take 1 tablet (325 mg total) by mouth 3 (three) times daily with meals.  30 tablet  0   .  furosemide (LASIX) 40 MG tablet  Take 40 mg by mouth every morning.     .  lansoprazole (PREVACID) 15 MG capsule  Take 15 mg by mouth every morning.     .  nitroGLYCERIN (NITROSTAT) 0.4 MG SL tablet  Place 1 tablet (0.4 mg total) under the tongue  every 5 (five) minutes as needed for chest pain.  25 tablet  3   .  potassium chloride (K-DUR) 10 MEQ tablet  Take 1 tablet (10 mEq total) by mouth 2 (two) times daily.  30 tablet  0   .  Rivaroxaban (XARELTO) 20 MG TABS  Take 20 mg by mouth daily with supper.     .  traMADol (ULTRAM) 50 MG tablet  Take 100 mg by mouth 4 (four) times daily as needed. Joint pain     No Known Allergies  Review of Systems  Constitutional: Positive for activity change and fatigue. Negative for fever, chills and appetite change.  Eyes: Negative.  Respiratory: Positive for shortness of breath.  Cardiovascular: Positive for leg swelling. Negative for chest pain and palpitations.  Gastrointestinal: Positive for constipation.  Reflux with heartburn.  Genitourinary: Negative.  Musculoskeletal: Positive for joint swelling, arthralgias and gait problem.  Skin: Negative.  Neurological: Positive for headaches.  Hematological: Bruises/bleeds easily.  Psychiatric/Behavioral: Negative.  BP 137/89  Pulse 86  Resp 20  Ht 5' 11" (1.803 m)  Wt 200 lb (90.719 kg)  BMI 27.89 kg/m2  SpO2 97%  Physical Exam  Constitutional: She is oriented to person, place, and time. She appears well-developed and well-nourished. No distress.  HENT:  Head: Normocephalic and atraumatic.  Mouth/Throat: Oropharynx is clear and moist.  Eyes: EOM are normal. Pupils are equal, round, and reactive to light.  Neck: Normal range of motion. Neck supple. JVD present. No thyromegaly present.  Cardiovascular: Normal rate and regular rhythm.  2/6 systolic murmur at apex  Pulmonary/Chest: Effort normal and breath sounds normal. She has no rales.  Pacemaker left anterior chest wall still has steri-strips over the incision.  Abdominal: Soft. Bowel sounds are normal. She exhibits no distension and no mass. There is no tenderness.  Musculoskeletal: Normal range of motion. She exhibits no edema.    Lymphadenopathy:  She has no cervical adenopathy.    Neurological: She is alert and oriented to person, place, and time. She has normal strength. No cranial nerve deficit or sensory deficit.  Skin: Skin is warm and dry.  Small skin tear just below right knee. No sign of infection.  Psychiatric: She has a normal mood and affect.  Impression/Plan:  She has class III congestive heart failure symptoms with left bundle-branch block morphology and ischemic cardiomyopathy with an ejection fraction of 30%. A biventricular pacemaker is felt to have the best chance of improving her congestive heart failure symptoms. I agree with insertion of a left ventricular epicardial pacing lead through a small left thoracotomy incision. This can be tunneled to her biventricular pacing generator and the old transvenous left ventricular lead can be removed. I discussed the operative procedure with her and her husband. I discussed alternatives, benefits, and risks including but not limited to bleeding, infection, injury to the heart or lung, malfunction of the pacing system, and the possibility that this may not improve her congestive heart failure symptoms. I told them that there is an approximately 50% chance that it will improve her congestive heart failure symptoms and this may not be immediate. She understands all this and agrees to proceed. We will schedule this for next Thursday, 03/17/2012.     

## 2012-03-11 ENCOUNTER — Encounter (HOSPITAL_COMMUNITY): Payer: Self-pay | Admitting: Pharmacy Technician

## 2012-03-12 ENCOUNTER — Emergency Department (HOSPITAL_COMMUNITY): Payer: Medicare Other

## 2012-03-12 ENCOUNTER — Encounter (HOSPITAL_COMMUNITY): Payer: Self-pay

## 2012-03-12 ENCOUNTER — Emergency Department (HOSPITAL_COMMUNITY)
Admission: EM | Admit: 2012-03-12 | Discharge: 2012-03-12 | Disposition: A | Discharge status: Home / Self Care | Payer: Medicare Other | Attending: Emergency Medicine | Admitting: Emergency Medicine

## 2012-03-12 DIAGNOSIS — Z7982 Long term (current) use of aspirin: Secondary | ICD-10-CM | POA: Insufficient documentation

## 2012-03-12 DIAGNOSIS — Z79899 Other long term (current) drug therapy: Secondary | ICD-10-CM | POA: Insufficient documentation

## 2012-03-12 DIAGNOSIS — Z950 Presence of cardiac pacemaker: Secondary | ICD-10-CM | POA: Insufficient documentation

## 2012-03-12 DIAGNOSIS — Z90710 Acquired absence of both cervix and uterus: Secondary | ICD-10-CM | POA: Insufficient documentation

## 2012-03-12 DIAGNOSIS — R062 Wheezing: Secondary | ICD-10-CM | POA: Insufficient documentation

## 2012-03-12 DIAGNOSIS — Z8719 Personal history of other diseases of the digestive system: Secondary | ICD-10-CM | POA: Insufficient documentation

## 2012-03-12 DIAGNOSIS — K219 Gastro-esophageal reflux disease without esophagitis: Secondary | ICD-10-CM | POA: Insufficient documentation

## 2012-03-12 DIAGNOSIS — R0602 Shortness of breath: Secondary | ICD-10-CM | POA: Insufficient documentation

## 2012-03-12 DIAGNOSIS — Z8739 Personal history of other diseases of the musculoskeletal system and connective tissue: Secondary | ICD-10-CM | POA: Insufficient documentation

## 2012-03-12 DIAGNOSIS — Z862 Personal history of diseases of the blood and blood-forming organs and certain disorders involving the immune mechanism: Secondary | ICD-10-CM | POA: Insufficient documentation

## 2012-03-12 DIAGNOSIS — K5900 Constipation, unspecified: Secondary | ICD-10-CM | POA: Insufficient documentation

## 2012-03-12 DIAGNOSIS — I2510 Atherosclerotic heart disease of native coronary artery without angina pectoris: Secondary | ICD-10-CM | POA: Insufficient documentation

## 2012-03-12 DIAGNOSIS — I10 Essential (primary) hypertension: Secondary | ICD-10-CM | POA: Insufficient documentation

## 2012-03-12 DIAGNOSIS — M7500 Adhesive capsulitis of unspecified shoulder: Secondary | ICD-10-CM | POA: Insufficient documentation

## 2012-03-12 DIAGNOSIS — Z8679 Personal history of other diseases of the circulatory system: Secondary | ICD-10-CM | POA: Insufficient documentation

## 2012-03-12 DIAGNOSIS — R42 Dizziness and giddiness: Secondary | ICD-10-CM | POA: Insufficient documentation

## 2012-03-12 DIAGNOSIS — Z87891 Personal history of nicotine dependence: Secondary | ICD-10-CM | POA: Insufficient documentation

## 2012-03-12 DIAGNOSIS — I509 Heart failure, unspecified: Secondary | ICD-10-CM | POA: Insufficient documentation

## 2012-03-12 DIAGNOSIS — R109 Unspecified abdominal pain: Secondary | ICD-10-CM | POA: Insufficient documentation

## 2012-03-12 DIAGNOSIS — K5909 Other constipation: Secondary | ICD-10-CM

## 2012-03-12 DIAGNOSIS — Z8639 Personal history of other endocrine, nutritional and metabolic disease: Secondary | ICD-10-CM | POA: Insufficient documentation

## 2012-03-12 DIAGNOSIS — Z8659 Personal history of other mental and behavioral disorders: Secondary | ICD-10-CM | POA: Insufficient documentation

## 2012-03-12 DIAGNOSIS — I4891 Unspecified atrial fibrillation: Secondary | ICD-10-CM | POA: Insufficient documentation

## 2012-03-12 LAB — BASIC METABOLIC PANEL
Calcium: 9.6 mg/dL (ref 8.4–10.5)
Creatinine, Ser: 0.88 mg/dL (ref 0.50–1.10)
GFR calc Af Amer: 72 mL/min — ABNORMAL LOW (ref >90)
GFR calc non Af Amer: 62 mL/min — ABNORMAL LOW (ref >90)

## 2012-03-12 LAB — CBC
MCH: 27.9 pg (ref 26.0–34.0)
MCV: 89.1 fL (ref 78.0–100.0)
Platelets: 208 10*3/uL (ref 150–400)
RDW: 21.2 % — ABNORMAL HIGH (ref 11.5–15.5)
WBC: 5.2 10*3/uL (ref 4.0–10.5)

## 2012-03-12 LAB — PRO B NATRIURETIC PEPTIDE: Pro B Natriuretic peptide (BNP): 5680 pg/mL — ABNORMAL HIGH (ref 0–450)

## 2012-03-12 MED ORDER — FUROSEMIDE 10 MG/ML IJ SOLN
80.0000 mg | Freq: Once | INTRAMUSCULAR | Status: AC
  Administered 2012-03-12: 80 mg via INTRAVENOUS
  Filled 2012-03-12: qty 8

## 2012-03-12 MED ORDER — MORPHINE SULFATE 4 MG/ML IJ SOLN
4.0000 mg | INTRAMUSCULAR | Status: DC | PRN
  Administered 2012-03-12: 4 mg via INTRAVENOUS
  Filled 2012-03-12: qty 1

## 2012-03-12 NOTE — ED Notes (Signed)
Pt transported to radiology.

## 2012-03-12 NOTE — ED Notes (Addendum)
Pt c/o of symptoms intermittently for 2 weeks.  Pt states she feels her symptoms are getting worse.  Pt seen by cardio-thoracic surgeon a couple days ago and pt is scheduled for surgery this upcoming week.  The surgeon felt that his surgical intervention will make pt feel better.

## 2012-03-12 NOTE — ED Notes (Signed)
Pt c/o of stomach pain, right shoulder pain, dizziness, and nausea.  Pt has pacemaker.

## 2012-03-12 NOTE — ED Provider Notes (Addendum)
I saw the patient, reviewed the resident's note and I agree with the findings and plan.   .Face to face Exam:  General:  Awake HEENT:  Atraumatic Resp:  Normal effort Abd:  Nondistended Neuro:No focal weakness      Nelia Shi, MD 03/12/12 1500  Nelia Shi, MD 04/29/12 [phone]

## 2012-03-12 NOTE — ED Provider Notes (Signed)
History     CSN: [id number]  Arrival date & time 03/12/12  0824   First MD Initiated Contact with Patient 03/12/12 [phone]     Chief Complaint  Patient presents with  . Abdominal Pain  . Shoulder Pain  . Dizziness  . Nausea    HPI Patient is a 79 yo F with PMH of HTN, h/o Afib on Xarelto, CHB with pacemaker and CHF due to ischemic cardiomyopathy (last EF 30%) who presents to ED with c/o of stomach pain, right shoulder pain, dizziness, and dyspnea. Pt had pacer upgrade last month which resulted in diaphragmatic pacing, she has a planned thoracotomy soon for epicardial lead placement. Patient comes in today with following 4 complaints, which she states are not new, but getting worse.  1. SOB- Pt states she has been having increased dyspnea with any activity. She reports some wheezing and increased WOB overnight. She required 4 pillows to sleep last night, which is slightly more than usual. Last echo was Sept 2013 which showed EF of 30%. She has had cardiac procedure since that time. She takes Lasix 40mg  daily. Does have some edema in her legs which improves with elevation. She does not have any history of lung disease, but is a former smoker.   2. Dizziness- She reports feeling "light headed" with any changes in position. This also, is not new. She states the cardiologist is working on this.   3. Shoulder pain- Patient has left shoulder pain after her pacemaker placement which is unchanged, but she also has right shoulder arthritis that has been getting progressively worse. She takes Tramadol for the pain which takes the edge off but does not make pain go away.   4. Abdominal pain- Pt reports after her surgery she has been having intermittent lower abdominal pain. Denies dysuria, urinary frequency. She does have constipation for which she takes stool softener. She reports no bloody or tarry stools. She states she has never had a groin procedure before.    Past Medical History  Diagnosis Date  .  HTN (hypertension)   . Hernia     Incisional hernia, umbilicus.  . H/O: hysterectomy   . CHB (complete heart block)     s/p pacer ([facility] => GT)  . Atrial fibrillation     on xarleto  . Depression   . Goiter   . DJD (degenerative joint disease)   . GERD (gastroesophageal reflux disease)   . Diverticulosis   . Cardiomyopathy secondary     Myoview 3/13: small inf scar apex to base with mild peri-infarct isch; EF 32%;   LHC 05/12/11: pLAD 20%, pCFX 20%, mid 30%, m+dRCA with mild luminal irregs; EF 35% with mid inf wal and inf-apical HK(no culprit for segmental WMA)  . CAD (coronary artery disease)     nonobs by [facility] 4/13  . Systolic CHF   . Pacemaker     upgrade to BiV PPM 02/19/12 (LV lead off)  . H/O hiatal hernia   . LBBB (left bundle branch block)     Past Surgical History  Procedure Date  . Pacemaker placement , 2002.  [name] Cholecystectomy   . Insert / replace / remove pacemaker   . Abdominal hysterectomy   . Tubal ligation   . Small intestine surgery     Family History  Problem Relation Age of Onset  . Adopted: Yes  . Coronary artery disease    . Other      stomach carcinoma  . Stomach cancer Other   .  Coronary artery disease Other   . Cancer Mother     History  Substance Use Topics  . Smoking status: Former Smoker    Types: Cigarettes    Quit date: 01/21/2009  . Smokeless tobacco: Former Neurosurgeon    Quit date: 01/21/2009  . Alcohol Use: No    OB History    Grav Para Term Preterm Abortions TAB SAB Ect Mult Living                  Review of Systems  Constitutional: Negative for fever and activity change.  Respiratory: Positive for shortness of breath and wheezing. Negative for cough and chest tightness.   Cardiovascular: Negative for chest pain.  Gastrointestinal: Positive for abdominal pain and constipation. Negative for nausea and blood in stool.  Genitourinary: Negative for dysuria.  Musculoskeletal: Positive for arthralgias.  Skin: Negative for rash.   Neurological: Positive for light-headedness. Negative for numbness and headaches.    Allergies  Review of patient's allergies indicates no known allergies.  Home Medications   Current Outpatient Rx  Name  Route  Sig  Dispense  Refill  . ASPIRIN EC 81 MG PO TBEC   Oral   Take 81 mg by mouth every morning.          . ATORVASTATIN CALCIUM 20 MG PO TABS   Oral   Take 20 mg by mouth every morning.         [name] CARVEDILOL 25 MG PO TABS   Oral   Take 25 mg by mouth 2 (two) times daily with a meal.           . ERGOCALCIFEROL 50000 UNITS PO CAPS   Oral   Take 50,000 Units by mouth every Wednesday.          [name] FERROUS SULFATE 325 (65 FE) MG PO TABS   Oral   Take 1 tablet (325 mg total) by mouth 3 (three) times daily with meals.   30 tablet   0   . FUROSEMIDE 40 MG PO TABS   Oral   Take 40 mg by mouth every morning.         [name] LANSOPRAZOLE 15 MG PO CPDR   Oral   Take 15 mg by mouth every morning.          . IMODIUM PO   Oral   Take 1 tablet by mouth daily.         [name] NITROGLYCERIN 0.4 MG SL SUBL   Sublingual   Place 1 tablet (0.4 mg total) under the tongue every 5 (five) minutes as needed for chest pain.   25 tablet   3   . POTASSIUM CHLORIDE ER 10 MEQ PO TBCR   Oral   Take 1 tablet (10 mEq total) by mouth 2 (two) times daily.   30 tablet   0   . TRAMADOL HCL 50 MG PO TABS   Oral   Take 100 mg by mouth 4 (four) times daily as needed. Joint pain         . ACETAMINOPHEN 500 MG PO TABS   Oral   Take 500 mg by mouth every 6 (six) hours as needed. For pain         . RIVAROXABAN 20 MG PO TABS   Oral   Take 20 mg by mouth daily with supper.            BP 107/49  Pulse 72  Temp 98 F (36.7 C) (Oral)  Resp 15  SpO2 99%  Physical Exam  Constitutional: She is oriented to person, place, and time. She appears well-developed and well-nourished. No distress.  HENT:  Head: Normocephalic and atraumatic.  Neck: Normal range of motion.   Cardiovascular: Normal rate and regular rhythm.   Pulmonary/Chest: Effort normal. She has no wheezes. She has no rales. She exhibits tenderness (Over pacemaker site. Well healed incision).  Abdominal: Soft. She exhibits no distension and no mass. There is tenderness (Mild tenderness to deep palpation suprapubic ). There is no rebound.  Musculoskeletal: She exhibits edema (1+ to the mid-shin) and tenderness.       Right shoulder ROM limited to <90 degrees. TTP over bony prominences   Neurological: She is alert and oriented to person, place, and time. No cranial nerve deficit.  Skin: Skin is warm and dry. No rash noted. She is not diaphoretic.  Psychiatric: She has a normal mood and affect.    ED Course  Procedures (including critical care time)  EKG: unchanged from previous tracings, prolonged QT interval, V paced at 70bmp.  Labs Reviewed  CBC - Abnormal; Notable for the following:    Hemoglobin 11.0 (*)     HCT 35.1 (*)     RDW 21.2 (*)     All other components within normal limits  BASIC METABOLIC PANEL - Abnormal; Notable for the following:    Glucose, Bld 103 (*)     GFR calc non Af Amer 62 (*)     GFR calc Af Amer 72 (*)     All other components within normal limits  PRO B NATRIURETIC PEPTIDE - Abnormal; Notable for the following:    Pro B Natriuretic peptide (BNP) 5680.0 (*)     All other components within normal limits  POCT I-STAT TROPONIN I   Dg Chest 2 View  03/12/2012  *RADIOLOGY REPORT*  Clinical Data: Abdominal/right shoulder pain  CHEST - 2 VIEW  Comparison: 02/20/2012  Findings: Lungs are essentially clear.  No focal consolidation. No pleural effusion or pneumothorax.  Moderate cardiomegaly.  Left subclavian pacemaker.  Mild degenerative changes of the visualized thoracolumbar spine.  IMPRESSION: No evidence of acute cardiopulmonary disease.   Original Report Authenticated By: Charline Bills, M.D.    Dg Abd 1 View  03/12/2012  *RADIOLOGY REPORT*  Clinical Data:  Abdominal pain, nausea, dizzy  ABDOMEN - 1 VIEW  Comparison: None.  Findings: Normal single frontal view of the abdomen demonstrates a nonobstructed bowel gas pattern.  Gas and stool noted throughout the colon.  Multiple helical surgical tracts suggest prior laparoscopic ventral hernia repair.  There is levoconvex scoliosis of the lumbar spine centered at L2-L3 with multilevel degenerative disc disease.  The visualized lung bases are clear.  No acute osseous abnormality.  IMPRESSION: Nonobstructed bowel gas pattern.   Original Report Authenticated By: Malachy Moan, M.D.     No diagnosis found.   MDM  79 yo F with multiple complaints including shoulder pain, abdominal pain, dyspnea and dizziness CBC, Cmet, Troponin and Pro-BNP drawn. Will get chest X-ray and KUB  DDx: Shoulder pain appears to be capsulitis and/or referred pain from phrenic stimulation. Abdominal pain does not appear to be AAA, appendix or GB based on physical exam and history. She does have history of umbilical hernia repair. Will get KUB to evaluated stool burden. Dyspnea ddx includes CHF or acute respiratory illness. Will get CXR, pro-BNP to evaluate fluid status. Dizziness evaluated by cards. Will get orthostatic vital signs and monitor on telemetry  while in ED  1112- Pro-BNP elevated from prior level. CXR does not indicate fluid overload. Troponin negative. No O2 requirement. Ambulating well. 1143- KUB reviewed does not show bowel obstruction or appearance of large stool burden. Would likely benefit from daily Miralax in addition to stool softener to help with constipation 1218- Spoke with [facility] cardiology given pts DOE and increased pro-BNP. Suggested giving dose of Lasix here in ED, then double dose to 80mg  po daily. She should follow up with Dr. Ladona Ridgel early next week. 1330- Pt has IV and received Lasix. She has urinated. Overall starting to feel slightly better. 1445- Pt feeling much better after Lasix and Morphine.  States she feels ready to go home.  For abd pain she should take Miralax in addition to stool softener. If pain gets worse, she should return. For SOB, spoke with  Cardiologist on call. Had improvement with Lasix 80 IV once, will increase daily dose to 80mg  daily until she follows up with cardiology in 2-3 days. For shoulder pain, some relief with Morphine. This is most likely due to frozen shoulder, but referred pain from phrenic nerve also possible. Encourage her to take Tramadol 100mg  four times per day. If this does not help, she should address it with her PCP. For dizziness, she ambulated well in the ED. No EKG changes. Follow up with cardiology.     Hilarie Fredrickson, MD 03/12/12 [phone]

## 2012-03-12 NOTE — ED Notes (Signed)
Patient also reports she is having some dizziness when she first gets up

## 2012-03-12 NOTE — ED Notes (Signed)
Patient states she noted onset of abdominal pain on 01-30 when her pacemaker was checked.  States it felt like something bubbled up in her abdomen.  She states she had a bm on yesterday but states it was firm.  Patient with no n/v

## 2012-03-15 ENCOUNTER — Encounter (HOSPITAL_COMMUNITY): Payer: Self-pay

## 2012-03-15 ENCOUNTER — Encounter (HOSPITAL_COMMUNITY)
Admission: RE | Admit: 2012-03-15 | Discharge: 2012-03-15 | Disposition: A | Discharge status: Home / Self Care | Payer: Medicare Other | Source: Ambulatory Visit | Attending: Surgery | Admitting: Surgery

## 2012-03-15 ENCOUNTER — Ambulatory Visit (HOSPITAL_COMMUNITY)
Admission: RE | Admit: 2012-03-15 | Discharge: 2012-03-15 | Disposition: A | Discharge status: Home / Self Care | Payer: Medicare Other | Source: Ambulatory Visit | Attending: Surgery | Admitting: Surgery

## 2012-03-15 VITALS — BP 151/87 | HR 100 | Temp 97.7°F | Resp 20 | Ht 71.0 in | Wt 207.6 lb

## 2012-03-15 DIAGNOSIS — I509 Heart failure, unspecified: Secondary | ICD-10-CM

## 2012-03-15 LAB — COMPREHENSIVE METABOLIC PANEL
AST: 17 U/L (ref 0–37)
Albumin: 3.9 g/dL (ref 3.5–5.2)
Alkaline Phosphatase: 104 U/L (ref 39–117)
BUN: 19 mg/dL (ref 6–23)
Creatinine, Ser: 0.83 mg/dL (ref 0.50–1.10)
Potassium: 3.6 mEq/L (ref 3.5–5.1)
Total Protein: 6.4 g/dL (ref 6.0–8.3)

## 2012-03-15 LAB — PROTIME-INR
INR: 1.48 (ref 0.00–1.49)
Prothrombin Time: 17.5 seconds — ABNORMAL HIGH (ref 11.6–15.2)

## 2012-03-15 LAB — BLOOD GAS, ARTERIAL
Drawn by: 206361
Patient temperature: 98.6
TCO2: 27.6 mmol/L (ref 0–100)
pO2, Arterial: 87.3 mmHg (ref 80.0–100.0)

## 2012-03-15 LAB — URINE MICROSCOPIC-ADD ON

## 2012-03-15 LAB — URINALYSIS, ROUTINE W REFLEX MICROSCOPIC
Ketones, ur: NEGATIVE mg/dL
Nitrite: NEGATIVE
Protein, ur: 30 mg/dL — AB
pH: 5 (ref 5.0–8.0)

## 2012-03-15 LAB — CBC
HCT: 34.9 % — ABNORMAL LOW (ref 36.0–46.0)
MCHC: 30.9 g/dL (ref 30.0–36.0)
Platelets: 217 10*3/uL (ref 150–400)
RDW: 20.2 % — ABNORMAL HIGH (ref 11.5–15.5)
WBC: 4.9 10*3/uL (ref 4.0–10.5)

## 2012-03-15 LAB — TYPE AND SCREEN
ABO/RH(D): O POS
Antibody Screen: NEGATIVE

## 2012-03-15 NOTE — Pre-Procedure Instructions (Signed)
Halley Shepheard  03/15/2012   Your procedure is scheduled on:  Mar 17, 1022  Report to [facility] at 5:30 AM.  Call this number if you have problems the morning of surgery: [phone]   Remember:   Do not eat food or drink liquids after midnight.   Take these medicines the morning of surgery with A SIP OF WATER: tylenol as needed, coreg, prevacid, nitrogylcerin as needed, ulram as needed         XARELTO AND ASPIRIN AS DIRECTED   Do not wear jewelry, make-up or nail polish.  Do not wear lotions, powders, or perfumes. You may wear deodorant.  Do not shave 48 hours prior to surgery. Men may shave face and neck.  Do not bring valuables to the hospital.  Contacts, dentures or bridgework may not be worn into surgery.  Leave suitcase in the car. After surgery it may be brought to your room.  For patients admitted to the hospital, checkout time is 11:00 AM the day of  discharge.   Patients discharged the day of surgery will not be allowed to drive  home.  Name and phone number of your driver:   Special Instructions: Shower using CHG 2 nights before surgery and the night before surgery.  If you shower the day of surgery use CHG.  Use special wash - you have one bottle of CHG for all showers.  You should use approximately 1/3 of the bottle for each shower.   Please read over the following fact sheets that you were given: Pain Booklet, Coughing and Deep Breathing, Blood Transfusion Information and Surgical Site Infection Prevention

## 2012-03-15 NOTE — Pre-Procedure Instructions (Signed)
[name]  03/15/2012   Your procedure is scheduled on:  Mar 17, 2012  Report to [facility] at 5:30 AM.  Call this number if you have problems the morning of surgery: [phone]   Remember:   Do not eat food or drink liquids after midnight.   Take these medicines the morning of surgery with A SIP OF WATER: tylenol as needed, carvedilol, prevacid, ulram as needed  ASPIRIN AND XARELTO AS DIRECTED   Do not wear jewelry, make-up or nail polish.  Do not wear lotions, powders, or perfumes. You may wear deodorant.  Do not shave 48 hours prior to surgery. Men may shave face and neck.  Do not bring valuables to the hospital.  Contacts, dentures or bridgework may not be worn into surgery.  Leave suitcase in the car. After surgery it may be brought to your room.  For patients admitted to the hospital, checkout time is 11:00 AM the day of  discharge.   Patients discharged the day of surgery will not be allowed to drive  home.  Name and phone number of your driver:   Special Instructions: Shower using CHG 2 nights before surgery and the night before surgery.  If you shower the day of surgery use CHG.  Use special wash - you have one bottle of CHG for all showers.  You should use approximately 1/3 of the bottle for each shower.   Please read over the following fact sheets that you were given: Pain Booklet, Coughing and Deep Breathing, Blood Transfusion Information and Surgical Site Infection Prevention

## 2012-03-16 LAB — URINE CULTURE
Colony Count: NO GROWTH
Culture: NO GROWTH

## 2012-03-16 MED ORDER — DEXTROSE 5 % IV SOLN
1.5000 g | INTRAVENOUS | Status: AC
  Administered 2012-03-17: 1.5 g via INTRAVENOUS
  Filled 2012-03-16: qty 1.5

## 2012-03-17 ENCOUNTER — Encounter (HOSPITAL_COMMUNITY): Payer: Self-pay

## 2012-03-17 ENCOUNTER — Inpatient Hospital Stay (HOSPITAL_COMMUNITY): Payer: Medicare Other | Admitting: Certified Registered"

## 2012-03-17 ENCOUNTER — Encounter (HOSPITAL_COMMUNITY): Payer: Self-pay | Admitting: Certified Registered"

## 2012-03-17 ENCOUNTER — Encounter (HOSPITAL_COMMUNITY): Payer: Self-pay | Admitting: *Deleted

## 2012-03-17 ENCOUNTER — Encounter (HOSPITAL_COMMUNITY)
Admission: RE | Disposition: A | Discharge status: Home / Self Care | Payer: Self-pay | Source: Ambulatory Visit | Attending: Surgery

## 2012-03-17 ENCOUNTER — Inpatient Hospital Stay (HOSPITAL_COMMUNITY): Payer: Medicare Other

## 2012-03-17 ENCOUNTER — Inpatient Hospital Stay (HOSPITAL_COMMUNITY)
Admission: RE | Admit: 2012-03-17 | Discharge: 2012-03-20 | DRG: 261 | Disposition: A | Discharge status: Home / Self Care | Payer: Medicare Other | Source: Ambulatory Visit | Attending: Surgery | Admitting: Surgery

## 2012-03-17 DIAGNOSIS — Z950 Presence of cardiac pacemaker: Secondary | ICD-10-CM

## 2012-03-17 DIAGNOSIS — Y831 Surgical operation with implant of artificial internal device as the cause of abnormal reaction of the patient, or of later complication, without mention of misadventure at the time of the procedure: Secondary | ICD-10-CM | POA: Diagnosis present

## 2012-03-17 DIAGNOSIS — K219 Gastro-esophageal reflux disease without esophagitis: Secondary | ICD-10-CM | POA: Diagnosis present

## 2012-03-17 DIAGNOSIS — I5022 Chronic systolic (congestive) heart failure: Secondary | ICD-10-CM | POA: Diagnosis present

## 2012-03-17 DIAGNOSIS — I2510 Atherosclerotic heart disease of native coronary artery without angina pectoris: Secondary | ICD-10-CM

## 2012-03-17 DIAGNOSIS — F3289 Other specified depressive episodes: Secondary | ICD-10-CM | POA: Diagnosis present

## 2012-03-17 DIAGNOSIS — I10 Essential (primary) hypertension: Secondary | ICD-10-CM | POA: Diagnosis present

## 2012-03-17 DIAGNOSIS — I2589 Other forms of chronic ischemic heart disease: Secondary | ICD-10-CM | POA: Diagnosis present

## 2012-03-17 DIAGNOSIS — K5730 Diverticulosis of large intestine without perforation or abscess without bleeding: Secondary | ICD-10-CM | POA: Diagnosis present

## 2012-03-17 DIAGNOSIS — I4891 Unspecified atrial fibrillation: Secondary | ICD-10-CM | POA: Diagnosis present

## 2012-03-17 DIAGNOSIS — I509 Heart failure, unspecified: Secondary | ICD-10-CM

## 2012-03-17 DIAGNOSIS — M1990 Unspecified osteoarthritis, unspecified site: Secondary | ICD-10-CM | POA: Diagnosis present

## 2012-03-17 DIAGNOSIS — T82190A Other mechanical complication of cardiac electrode, initial encounter: Principal | ICD-10-CM | POA: Diagnosis present

## 2012-03-17 DIAGNOSIS — F329 Major depressive disorder, single episode, unspecified: Secondary | ICD-10-CM | POA: Diagnosis present

## 2012-03-17 HISTORY — PX: EPICARDIAL PACING LEAD PLACEMENT: SHX6274

## 2012-03-17 SURGERY — INSERTION, EPICARDIAL ELECTRODE LEAD
Anesthesia: General | Site: Chest | Laterality: Left | Wound class: Clean

## 2012-03-17 MED ORDER — ONDANSETRON HCL 4 MG/2ML IJ SOLN
INTRAMUSCULAR | Status: DC | PRN
  Administered 2012-03-17: 4 mg via INTRAVENOUS

## 2012-03-17 MED ORDER — FENTANYL CITRATE 0.05 MG/ML IJ SOLN
INTRAMUSCULAR | Status: AC
  Filled 2012-03-17: qty 2

## 2012-03-17 MED ORDER — MEPERIDINE HCL 25 MG/ML IJ SOLN: 6.2500 mg | INTRAMUSCULAR | Status: DC | PRN

## 2012-03-17 MED ORDER — DEXTROSE-NACL 5-0.45 % IV SOLN
INTRAVENOUS | Status: DC
  Administered 2012-03-17: 20 mL/h via INTRAVENOUS

## 2012-03-17 MED ORDER — ATORVASTATIN CALCIUM 20 MG PO TABS
20.0000 mg | ORAL_TABLET | Freq: Every morning | ORAL | Status: DC
  Administered 2012-03-18 – 2012-03-20 (×3): 20 mg via ORAL
  Filled 2012-03-17 (×4): qty 1

## 2012-03-17 MED ORDER — ACETAMINOPHEN 10 MG/ML IV SOLN: 1000.0000 mg | Freq: Once | INTRAVENOUS | Status: DC | PRN

## 2012-03-17 MED ORDER — ASPIRIN EC 81 MG PO TBEC
81.0000 mg | DELAYED_RELEASE_TABLET | Freq: Every morning | ORAL | Status: DC
  Administered 2012-03-18 – 2012-03-20 (×3): 81 mg via ORAL
  Filled 2012-03-17 (×4): qty 1

## 2012-03-17 MED ORDER — LEVALBUTEROL HCL 0.63 MG/3ML IN NEBU
0.6300 mg | INHALATION_SOLUTION | Freq: Four times a day (QID) | RESPIRATORY_TRACT | Status: DC
  Administered 2012-03-17 – 2012-03-19 (×8): 0.63 mg via RESPIRATORY_TRACT
  Filled 2012-03-17 (×12): qty 3

## 2012-03-17 MED ORDER — HYDROMORPHONE HCL PF 1 MG/ML IJ SOLN
INTRAMUSCULAR | Status: AC
  Administered 2012-03-17: 0.5 mg
  Filled 2012-03-17: qty 1

## 2012-03-17 MED ORDER — MIDAZOLAM HCL 5 MG/5ML IJ SOLN
INTRAMUSCULAR | Status: DC | PRN
  Administered 2012-03-17 (×2): 1 mg via INTRAVENOUS

## 2012-03-17 MED ORDER — HYDROMORPHONE HCL PF 1 MG/ML IJ SOLN
0.2500 mg | INTRAMUSCULAR | Status: DC | PRN
  Administered 2012-03-17 (×2): 0.25 mg via INTRAVENOUS
  Administered 2012-03-17: 0.5 mg via INTRAVENOUS
  Administered 2012-03-17 (×3): 0.25 mg via INTRAVENOUS

## 2012-03-17 MED ORDER — ONDANSETRON HCL 4 MG/2ML IJ SOLN: 4.0000 mg | Freq: Four times a day (QID) | INTRAMUSCULAR | Status: DC | PRN

## 2012-03-17 MED ORDER — MIDAZOLAM HCL 2 MG/2ML IJ SOLN
INTRAMUSCULAR | Status: AC
  Filled 2012-03-17: qty 2

## 2012-03-17 MED ORDER — OXYCODONE HCL 5 MG/5ML PO SOLN: 5.0000 mg | Freq: Once | ORAL | Status: DC | PRN

## 2012-03-17 MED ORDER — GLYCOPYRROLATE 0.2 MG/ML IJ SOLN
INTRAMUSCULAR | Status: DC | PRN
  Administered 2012-03-17: 0.2 mg via INTRAVENOUS
  Administered 2012-03-17: 0.4 mg via INTRAVENOUS

## 2012-03-17 MED ORDER — SENNOSIDES-DOCUSATE SODIUM 8.6-50 MG PO TABS
1.0000 | ORAL_TABLET | Freq: Every evening | ORAL | Status: DC | PRN
  Filled 2012-03-17: qty 1

## 2012-03-17 MED ORDER — BISACODYL 5 MG PO TBEC
10.0000 mg | DELAYED_RELEASE_TABLET | Freq: Every day | ORAL | Status: DC
  Administered 2012-03-18 – 2012-03-20 (×3): 10 mg via ORAL
  Filled 2012-03-17 (×3): qty 2

## 2012-03-17 MED ORDER — HYDROMORPHONE HCL PF 1 MG/ML IJ SOLN
INTRAMUSCULAR | Status: AC
  Filled 2012-03-17: qty 1

## 2012-03-17 MED ORDER — HYDROMORPHONE HCL PF 1 MG/ML IJ SOLN
INTRAMUSCULAR | Status: AC
Start: 2012-03-17 — End: 2012-03-17
  Filled 2012-03-17: qty 1

## 2012-03-17 MED ORDER — FUROSEMIDE 40 MG PO TABS
40.0000 mg | ORAL_TABLET | Freq: Every morning | ORAL | Status: DC
  Administered 2012-03-18 – 2012-03-20 (×3): 40 mg via ORAL
  Filled 2012-03-17 (×4): qty 1

## 2012-03-17 MED ORDER — ERGOCALCIFEROL 1.25 MG (50000 UT) PO CAPS: 50000.0000 [IU] | ORAL_CAPSULE | ORAL | Status: DC

## 2012-03-17 MED ORDER — HYDROMORPHONE HCL PF 1 MG/ML IJ SOLN
INTRAMUSCULAR | Status: DC | PRN
  Administered 2012-03-17: 1 mg via INTRAVENOUS

## 2012-03-17 MED ORDER — INSULIN ASPART 100 UNIT/ML ~~LOC~~ SOLN
0.0000 [IU] | SUBCUTANEOUS | Status: DC
  Administered 2012-03-18 (×2): 2 [IU] via SUBCUTANEOUS

## 2012-03-17 MED ORDER — LACTATED RINGERS IV SOLN
INTRAVENOUS | Status: DC | PRN
  Administered 2012-03-17: 07:00:00 via INTRAVENOUS

## 2012-03-17 MED ORDER — CARVEDILOL 25 MG PO TABS
25.0000 mg | ORAL_TABLET | Freq: Two times a day (BID) | ORAL | Status: DC
  Administered 2012-03-18 – 2012-03-19 (×4): 25 mg via ORAL
  Filled 2012-03-17 (×8): qty 1

## 2012-03-17 MED ORDER — DEXTROSE 5 % IV SOLN
1.5000 g | Freq: Two times a day (BID) | INTRAVENOUS | Status: AC
  Administered 2012-03-17 – 2012-03-18 (×2): 1.5 g via INTRAVENOUS
  Filled 2012-03-17 (×2): qty 1.5

## 2012-03-17 MED ORDER — MORPHINE SULFATE 4 MG/ML IJ SOLN
4.0000 mg | INTRAMUSCULAR | Status: DC | PRN
  Administered 2012-03-17 – 2012-03-19 (×9): 4 mg via INTRAVENOUS
  Filled 2012-03-17 (×9): qty 1

## 2012-03-17 MED ORDER — VANCOMYCIN HCL IN DEXTROSE 1-5 GM/200ML-% IV SOLN
1000.0000 mg | Freq: Two times a day (BID) | INTRAVENOUS | Status: AC
  Administered 2012-03-17: 1000 mg via INTRAVENOUS
  Filled 2012-03-17: qty 200

## 2012-03-17 MED ORDER — OXYCODONE-ACETAMINOPHEN 5-325 MG PO TABS
1.0000 | ORAL_TABLET | ORAL | Status: DC | PRN
  Administered 2012-03-18 – 2012-03-20 (×7): 2 via ORAL
  Filled 2012-03-17 (×7): qty 2

## 2012-03-17 MED ORDER — POTASSIUM CHLORIDE 10 MEQ/50ML IV SOLN
10.0000 meq | Freq: Every day | INTRAVENOUS | Status: DC | PRN
  Filled 2012-03-17: qty 50

## 2012-03-17 MED ORDER — OXYCODONE HCL 5 MG PO TABS
5.0000 mg | ORAL_TABLET | ORAL | Status: AC | PRN
  Administered 2012-03-18 (×2): 10 mg via ORAL
  Filled 2012-03-17 (×2): qty 2

## 2012-03-17 MED ORDER — PANTOPRAZOLE SODIUM 40 MG PO TBEC
40.0000 mg | DELAYED_RELEASE_TABLET | Freq: Every day | ORAL | Status: DC
  Administered 2012-03-18 – 2012-03-20 (×3): 40 mg via ORAL
  Filled 2012-03-17 (×4): qty 1

## 2012-03-17 MED ORDER — 0.9 % SODIUM CHLORIDE (POUR BTL) OPTIME
TOPICAL | Status: DC | PRN
  Administered 2012-03-17: 1000 mL

## 2012-03-17 MED ORDER — NEOSTIGMINE METHYLSULFATE 1 MG/ML IJ SOLN
INTRAMUSCULAR | Status: DC | PRN
  Administered 2012-03-17: 2 mg via INTRAVENOUS
  Administered 2012-03-17: 3 mg via INTRAVENOUS

## 2012-03-17 MED ORDER — ETOMIDATE 2 MG/ML IV SOLN
INTRAVENOUS | Status: DC | PRN
  Administered 2012-03-17: 16 mg via INTRAVENOUS

## 2012-03-17 MED ORDER — TRAMADOL HCL 50 MG PO TABS
50.0000 mg | ORAL_TABLET | Freq: Four times a day (QID) | ORAL | Status: DC | PRN
  Administered 2012-03-19: 100 mg via ORAL
  Filled 2012-03-17: qty 2

## 2012-03-17 MED ORDER — PROMETHAZINE HCL 25 MG/ML IJ SOLN: 6.2500 mg | INTRAMUSCULAR | Status: DC | PRN

## 2012-03-17 MED ORDER — PROPOFOL 10 MG/ML IV BOLUS
INTRAVENOUS | Status: DC | PRN
  Administered 2012-03-17: 30 mg via INTRAVENOUS

## 2012-03-17 MED ORDER — PROMETHAZINE HCL 25 MG/ML IJ SOLN
INTRAMUSCULAR | Status: AC
  Filled 2012-03-17: qty 1

## 2012-03-17 MED ORDER — FENTANYL CITRATE 0.05 MG/ML IJ SOLN
INTRAMUSCULAR | Status: DC | PRN
  Administered 2012-03-17 (×6): 50 ug via INTRAVENOUS

## 2012-03-17 MED ORDER — OXYCODONE HCL 5 MG PO TABS
5.0000 mg | ORAL_TABLET | Freq: Once | ORAL | Status: DC | PRN
Start: 2012-03-17 — End: 2012-03-17

## 2012-03-17 MED ORDER — ROCURONIUM BROMIDE 100 MG/10ML IV SOLN
INTRAVENOUS | Status: DC | PRN
  Administered 2012-03-17: 10 mg via INTRAVENOUS
  Administered 2012-03-17: 50 mg via INTRAVENOUS
  Administered 2012-03-17: 10 mg via INTRAVENOUS

## 2012-03-17 MED ORDER — FERROUS SULFATE 325 (65 FE) MG PO TABS
325.0000 mg | ORAL_TABLET | Freq: Three times a day (TID) | ORAL | Status: DC
  Administered 2012-03-18 – 2012-03-20 (×8): 325 mg via ORAL
  Filled 2012-03-17 (×11): qty 1

## 2012-03-17 MED ORDER — POTASSIUM CHLORIDE ER 10 MEQ PO TBCR
10.0000 meq | EXTENDED_RELEASE_TABLET | Freq: Two times a day (BID) | ORAL | Status: DC
  Administered 2012-03-17 – 2012-03-20 (×6): 10 meq via ORAL
  Filled 2012-03-17 (×9): qty 1

## 2012-03-17 SURGICAL SUPPLY — 60 items
CANISTER SUCTION 2500CC (MISCELLANEOUS) ×4 IMPLANT
CATH KIT ON Q 5IN SLV (PAIN MANAGEMENT) IMPLANT
CATH THORACIC 28FR (CATHETERS) ×1 IMPLANT
CATH THORACIC 36FR (CATHETERS) IMPLANT
CATH THORACIC 36FR RT ANG (CATHETERS) IMPLANT
CLIP TI MEDIUM 24 (CLIP) ×2 IMPLANT
CLIP TI WIDE RED SMALL 24 (CLIP) ×1 IMPLANT
CLOTH BEACON ORANGE TIMEOUT ST (SAFETY) ×2 IMPLANT
CONN ST 1/4X3/8  BEN (MISCELLANEOUS)
CONN ST 1/4X3/8 BEN (MISCELLANEOUS) IMPLANT
CONN Y 3/8X3/8X3/8  BEN (MISCELLANEOUS)
CONN Y 3/8X3/8X3/8 BEN (MISCELLANEOUS) IMPLANT
CONT SPEC 4OZ CLIKSEAL STRL BL (MISCELLANEOUS) IMPLANT
COVER SURGICAL LIGHT HANDLE (MISCELLANEOUS) ×2 IMPLANT
DRAIN CHANNEL 28F RND 3/8 FF (WOUND CARE) IMPLANT
DRAIN CHANNEL 32F RND 10.7 FF (WOUND CARE) IMPLANT
DRAPE LAPAROSCOPIC ABDOMINAL (DRAPES) ×2 IMPLANT
DRAPE WARM FLUID 44X44 (DRAPE) ×2 IMPLANT
ELECT REM PT RETURN 9FT ADLT (ELECTROSURGICAL) ×2
ELECTRODE REM PT RTRN 9FT ADLT (ELECTROSURGICAL) ×1 IMPLANT
GLOVE EUDERMIC 7 POWDERFREE (GLOVE) ×4 IMPLANT
GOWN PREVENTION PLUS XLARGE (GOWN DISPOSABLE) ×4 IMPLANT
GOWN STRL NON-REIN LRG LVL3 (GOWN DISPOSABLE) ×4 IMPLANT
KIT BASIN OR (CUSTOM PROCEDURE TRAY) ×2 IMPLANT
KIT ROOM TURNOVER OR (KITS) ×2 IMPLANT
KIT SUCTION CATH 14FR (SUCTIONS) ×1 IMPLANT
KIT WRENCH (KITS) ×2 IMPLANT
LEAD PACING EPI (Prosthesis & Implant Heart) ×2 IMPLANT
NS IRRIG 1000ML POUR BTL (IV SOLUTION) ×4 IMPLANT
PACK CHEST (CUSTOM PROCEDURE TRAY) ×2 IMPLANT
PAD ARMBOARD 7.5X6 YLW CONV (MISCELLANEOUS) ×4 IMPLANT
SEALANT PROGEL (MISCELLANEOUS) IMPLANT
SEALANT SURG COSEAL 4ML (VASCULAR PRODUCTS) IMPLANT
SEALANT SURG COSEAL 8ML (VASCULAR PRODUCTS) IMPLANT
SOLUTION ANTI FOG 6CC (MISCELLANEOUS) IMPLANT
SPONGE GAUZE 4X4 12PLY (GAUZE/BANDAGES/DRESSINGS) ×2 IMPLANT
SUT PROLENE 3 0 SH DA (SUTURE) IMPLANT
SUT SILK  1 MH (SUTURE) ×1
SUT SILK 1 MH (SUTURE) ×2 IMPLANT
SUT SILK 2 0SH CR/8 30 (SUTURE) IMPLANT
SUT SILK 3 0SH CR/8 30 (SUTURE) IMPLANT
SUT VIC AB 1 CTX 36 (SUTURE) ×2
SUT VIC AB 1 CTX36XBRD ANBCTR (SUTURE) ×1 IMPLANT
SUT VIC AB 2-0 CT1 27 (SUTURE) ×2
SUT VIC AB 2-0 CT1 TAPERPNT 27 (SUTURE) IMPLANT
SUT VIC AB 2-0 CTX 36 (SUTURE) ×2 IMPLANT
SUT VIC AB 2-0 UR6 27 (SUTURE) IMPLANT
SUT VIC AB 3-0 MH 27 (SUTURE) IMPLANT
SUT VIC AB 3-0 SH 27 (SUTURE)
SUT VIC AB 3-0 SH 27X BRD (SUTURE) IMPLANT
SUT VIC AB 3-0 X1 27 (SUTURE) ×4 IMPLANT
SUT VICRYL 2 TP 1 (SUTURE) ×2 IMPLANT
SYSTEM SAHARA CHEST DRAIN ATS (WOUND CARE) ×2 IMPLANT
TIP APPLICATOR SPRAY EXTEND 16 (VASCULAR PRODUCTS) IMPLANT
TOWEL OR 17X24 6PK STRL BLUE (TOWEL DISPOSABLE) ×4 IMPLANT
TOWEL OR 17X26 10 PK STRL BLUE (TOWEL DISPOSABLE) ×4 IMPLANT
TRAP SPECIMEN MUCOUS 40CC (MISCELLANEOUS) IMPLANT
TRAY FOLEY CATH 14FRSI W/METER (CATHETERS) ×2 IMPLANT
TUBE CONNECTING 12X1/4 (SUCTIONS) ×2 IMPLANT
WATER STERILE IRR 1000ML POUR (IV SOLUTION) ×4 IMPLANT

## 2012-03-17 NOTE — Anesthesia Preprocedure Evaluation (Addendum)
Anesthesia Evaluation  Patient identified by MRN, date of birth, ID band Patient awake    Reviewed: Allergy & Precautions, H&P , NPO status , Patient's Chart, lab work & pertinent test results, reviewed documented beta blocker date and time   Airway Mallampati: I TM Distance: >3 FB Neck ROM: Full    Dental  (+) Dental Advisory Given, Edentulous Upper and Edentulous Lower   Pulmonary neg pulmonary ROS, shortness of breath and with exertion,  breath sounds clear to auscultation        Cardiovascular hypertension, Pt. on medications and Pt. on home beta blockers + CAD and +CHF + dysrhythmias Atrial Fibrillation + pacemaker Rhythm:Regular Rate:Normal     Neuro/Psych PSYCHIATRIC DISORDERS Depression negative neurological ROS     GI/Hepatic Neg liver ROS, hiatal hernia, GERD-  Medicated,  Endo/Other  negative endocrine ROS  Renal/GU negative Renal ROS     Musculoskeletal negative musculoskeletal ROS (+)   Abdominal (+) - obese,   Peds  Hematology negative hematology ROS (+)   Anesthesia Other Findings   Reproductive/Obstetrics                         Anesthesia Physical Anesthesia Plan  ASA: III  Anesthesia Plan: General   Post-op Pain Management:    Induction: Intravenous  Airway Management Planned: Double Lumen EBT  Additional Equipment: Arterial line and CVP  Intra-op Plan:   Post-operative Plan: Extubation in OR  Informed Consent: I have reviewed the patients History and Physical, chart, labs and discussed the procedure including the risks, benefits and alternatives for the proposed anesthesia with the patient or authorized representative who has indicated his/her understanding and acceptance.   Dental advisory given  Plan Discussed with: CRNA  Anesthesia Plan Comments:         Anesthesia Quick Evaluation

## 2012-03-17 NOTE — Anesthesia Procedure Notes (Signed)
Procedure Name: Intubation Date/Time: 03/17/2012 7:31 AM Performed by: Arlice Colt B Pre-anesthesia Checklist: Patient identified, Emergency Drugs available, Suction available, Patient being monitored and Timeout performed Patient Re-evaluated:Patient Re-evaluated prior to inductionOxygen Delivery Method: Circle system utilized Preoxygenation: Pre-oxygenation with 100% oxygen Intubation Type: IV induction Ventilation: Mask ventilation without difficulty and Oral airway inserted - appropriate to patient size Laryngoscope Size: Mac and 3 Grade View: Grade I Endobronchial tube: Left, Double lumen EBT, EBT position confirmed by auscultation and EBT position confirmed by fiberoptic bronchoscope and 39 Fr Number of attempts: 1 Airway Equipment and Method: Stylet Placement Confirmation: ETT inserted through vocal cords under direct vision,  positive ETCO2 and breath sounds checked- equal and bilateral Secured at: 27 (at gum line) cm Tube secured with: Tape Dental Injury: Teeth and Oropharynx as per pre-operative assessment

## 2012-03-17 NOTE — Interval H&P Note (Signed)
History and Physical Interval Note:  03/17/2012 7:18 AM  [name]  has presented today for surgery, with the diagnosis of Class III CHF  The various methods of treatment have been discussed with the patient and family. After consideration of risks, benefits and other options for treatment, the patient has consented to  Procedure(s) (LRB) with comments: THORACOTOMY MAJOR (Left) EPICARDIAL PACING LEAD PLACEMENT (Left) - placement of LV epicardial leads as a surgical intervention .  The patient's history has been reviewed, patient examined, no change in status, stable for surgery.  I have reviewed the patient's chart and labs.  Questions were answered to the patient's satisfaction.     [name]

## 2012-03-17 NOTE — Progress Notes (Signed)
Utilization review completed.  

## 2012-03-17 NOTE — H&P (Signed)
[address]            [place] [address]          [phone]      PCP is [name], MD  Referring Provider is Marinus Maw, MD  Chief Complaint   Patient presents with   .  Congestive Heart Failure     Referral from Dr Ladona Ridgel for surgical eval for Epic Cardial LV Lead placement   HPI:  The patient is a 79 year old woman with ischemic cardiomyopathy who underwent insertion of a permanent pacemaker in 2003 for symptomatic bradycardia with complete heart block. She subsequently underwent generator change on 02/17/2010 when she reached beyond the ERI. She has now developed class III congestive heart failure symptoms with exertional dyspnea, fatigue, and lower extremity edema. She underwent insertion of a new left ventricular lead, removal of her old dual-chamber pacemaker generator, and insertion of a new biventricular pacemaker on 02/19/2012. Unfortunately, the left ventricular lead was causing diaphragmatic stimulation which persisted despite changing it to multiple locations. The lead was left in place with the hope that it would subsequently seat itself in a location that would not cause diaphragmatic pacing. This has not happened and it has continued to cause diaphragmatic pacing when tested. She was therefore referred for consideration of placing a left ventricular epicardial lead by a left thoracotomy approach.  Past Medical History   Diagnosis  Date   .  HTN (hypertension)    .  Hernia      Incisional hernia, umbilicus.   .  H/O: hysterectomy    .  CHB (complete heart block)      s/p pacer (Tenant => GT)   .  Atrial fibrillation      on xarleto   .  Depression    .  Goiter    .  DJD (degenerative joint disease)    .  GERD (gastroesophageal reflux disease)    .  Diverticulosis    .  Cardiomyopathy secondary      Myoview 3/13: small inf scar apex to base with mild peri-infarct isch; EF 32%; LHC 05/12/11: pLAD 20%, pCFX 20%, mid 30%, m+dRCA  with mild luminal irregs; EF 35% with mid inf wal and inf-apical HK(no culprit for segmental WMA)   .  CAD (coronary artery disease)      nonobs by [facility] 4/13   .  Systolic CHF    .  Pacemaker      upgrade to BiV PPM 02/19/12 (LV lead off)   .  H/O hiatal hernia    .  LBBB (left bundle branch block)     Past Surgical History   Procedure  Date   .  Pacemaker placement  , 2002.   [name]  Cholecystectomy    .  Insert / replace / remove pacemaker    .  Abdominal hysterectomy    .  Tubal ligation    .  Small intestine surgery     Family History   Problem  Relation  Age of Onset   .  Adopted: Yes   .  Coronary artery disease     .  Other        stomach carcinoma    .  Stomach cancer  Other    .  Coronary artery disease  Other    .  Cancer  Mother  Social History  History   Substance Use Topics   .  Smoking status:  Former Smoker     Types:  Cigarettes     Quit date:  01/21/2009   .  Smokeless tobacco:  Former Neurosurgeon     Quit date:  01/21/2009   .  Alcohol Use:  No    Current Outpatient Prescriptions   Medication  Sig  Dispense  Refill   .  acetaminophen (TYLENOL) 500 MG tablet  Take 1 tablet (500 mg total) by mouth every 6 (six) hours as needed. Joint pain  30 tablet  0   .  aspirin EC 81 MG tablet  Take 81 mg by mouth every morning.     [name]  atorvastatin (LIPITOR) 20 MG tablet  Take 20 mg by mouth every morning.     .  carvedilol (COREG) 25 MG tablet  Take 25 mg by mouth 2 (two) times daily with a meal.     .  ergocalciferol (VITAMIN D2) 50000 UNITS capsule  Take 50,000 Units by mouth every Wednesday.     .  ferrous sulfate 325 (65 FE) MG tablet  Take 1 tablet (325 mg total) by mouth 3 (three) times daily with meals.  30 tablet  0   .  furosemide (LASIX) 40 MG tablet  Take 40 mg by mouth every morning.     .  lansoprazole (PREVACID) 15 MG capsule  Take 15 mg by mouth every morning.     .  nitroGLYCERIN (NITROSTAT) 0.4 MG SL tablet  Place 1 tablet (0.4 mg total) under the tongue  every 5 (five) minutes as needed for chest pain.  25 tablet  3   .  potassium chloride (K-DUR) 10 MEQ tablet  Take 1 tablet (10 mEq total) by mouth 2 (two) times daily.  30 tablet  0   .  Rivaroxaban (XARELTO) 20 MG TABS  Take 20 mg by mouth daily with supper.     .  traMADol (ULTRAM) 50 MG tablet  Take 100 mg by mouth 4 (four) times daily as needed. Joint pain     No Known Allergies  Review of Systems  Constitutional: Positive for activity change and fatigue. Negative for fever, chills and appetite change.  Eyes: Negative.  Respiratory: Positive for shortness of breath.  Cardiovascular: Positive for leg swelling. Negative for chest pain and palpitations.  Gastrointestinal: Positive for constipation.  Reflux with heartburn.  Genitourinary: Negative.  Musculoskeletal: Positive for joint swelling, arthralgias and gait problem.  Skin: Negative.  Neurological: Positive for headaches.  Hematological: Bruises/bleeds easily.  Psychiatric/Behavioral: Negative.  BP 137/89  Pulse 86  Resp 20  Ht 5\' 11"  (1.803 m)  Wt 200 lb (90.719 kg)  BMI 27.89 kg/m2  SpO2 97%  Physical Exam  Constitutional: She is oriented to person, place, and time. She appears well-developed and well-nourished. No distress.  HENT:  Head: Normocephalic and atraumatic.  Mouth/Throat: Oropharynx is clear and moist.  Eyes: EOM are normal. Pupils are equal, round, and reactive to light.  Neck: Normal range of motion. Neck supple. JVD present. No thyromegaly present.  Cardiovascular: Normal rate and regular rhythm.  2/6 systolic murmur at apex  Pulmonary/Chest: Effort normal and breath sounds normal. She has no rales.  Pacemaker left anterior chest wall still has steri-strips over the incision.  Abdominal: Soft. Bowel sounds are normal. She exhibits no distension and no mass. There is no tenderness.  Musculoskeletal: Normal range of motion. She exhibits no edema.  Lymphadenopathy:  She has no cervical adenopathy.    Neurological: She is alert and oriented to person, place, and time. She has normal strength. No cranial nerve deficit or sensory deficit.  Skin: Skin is warm and dry.  Small skin tear just below right knee. No sign of infection.  Psychiatric: She has a normal mood and affect.  Impression/Plan:  She has class III congestive heart failure symptoms with left bundle-branch block morphology and ischemic cardiomyopathy with an ejection fraction of 30%. A biventricular pacemaker is felt to have the best chance of improving her congestive heart failure symptoms. I agree with insertion of a left ventricular epicardial pacing lead through a small left thoracotomy incision. This can be tunneled to her biventricular pacing generator and the old transvenous left ventricular lead can be removed. I discussed the operative procedure with her and her husband. I discussed alternatives, benefits, and risks including but not limited to bleeding, infection, injury to the heart or lung, malfunction of the pacing system, and the possibility that this may not improve her congestive heart failure symptoms. I told them that there is an approximately 50% chance that it will improve her congestive heart failure symptoms and this may not be immediate. She understands all this and agrees to proceed. We will schedule this for next Thursday, 03/17/2012.

## 2012-03-17 NOTE — Transfer of Care (Signed)
Immediate Anesthesia Transfer of Care Note  Patient: [name]  Procedure(s) Performed: Procedure(s) (LRB) with comments: EPICARDIAL PACING LEAD PLACEMENT (Left) - placement of LV epicardial leads  Patient Location: PACU  Anesthesia Type:General  Level of Consciousness: awake, alert  and oriented  Airway & Oxygen Therapy: Patient Spontanous Breathing and Patient connected to nasal cannula oxygen  Post-op Assessment: Report given to PACU RN and Post -op Vital signs reviewed and stable  Post vital signs: Reviewed and stable  Complications: No apparent anesthesia complications

## 2012-03-17 NOTE — Anesthesia Postprocedure Evaluation (Signed)
Anesthesia Post Note  Patient: [name]  Procedure(s) Performed: Procedure(s) (LRB): EPICARDIAL PACING LEAD PLACEMENT (Left)  Anesthesia type: General  Patient location: PACU  Post pain: Pain level controlled  Post assessment: Post-op Vital signs reviewed  Last Vitals: BP 165/83  Pulse 70  Temp 36.4 C (Oral)  Resp 17  SpO2 96%  Post vital signs: Reviewed  Level of consciousness: sedated  Complications: No apparent anesthesia complications

## 2012-03-17 NOTE — Brief Op Note (Signed)
03/17/2012  10:40 AM  PATIENT:  [name]  79 y.o. female  PRE-OPERATIVE DIAGNOSIS:  Class III CHF  POST-OPERATIVE DIAGNOSIS:  Class III CHF  PROCEDURE:  Procedure(s) (LRB) with comments: Left thoracotomy, placement of LV epicardial leads x2, revision of BI-V system with removal of old transvenous coronary sinus lead.  SURGEON:  Surgeon(s) and Role:    * [name], MD - Primary  PHYSICIAN ASSISTANT: none  ASSISTANTS: none   ANESTHESIA:   general  EBL:     BLOOD ADMINISTERED:none  DRAINS: 1 28 F Chest Tube(s) in the left pleural space   LOCAL MEDICATIONS USED:  NONE  SPECIMEN:  No Specimen  DISPOSITION OF SPECIMEN:  N/A  COUNTS:  YES  PLAN OF CARE: Admit to inpatient   PATIENT DISPOSITION:  PACU - hemodynamically stable.   Delay start of Pharmacological VTE agent (>24hrs) due to surgical blood loss or risk of bleeding: yes

## 2012-03-18 ENCOUNTER — Inpatient Hospital Stay (HOSPITAL_COMMUNITY): Payer: Medicare Other

## 2012-03-18 LAB — CBC
HCT: 34.5 % — ABNORMAL LOW (ref 36.0–46.0)
MCH: 27.3 pg (ref 26.0–34.0)
MCV: 88 fL (ref 78.0–100.0)
Platelets: 203 10*3/uL (ref 150–400)
RDW: 19.6 % — ABNORMAL HIGH (ref 11.5–15.5)
WBC: 12.9 10*3/uL — ABNORMAL HIGH (ref 4.0–10.5)

## 2012-03-18 LAB — BASIC METABOLIC PANEL
BUN: 17 mg/dL (ref 6–23)
CO2: 29 mEq/L (ref 19–32)
Calcium: 9.6 mg/dL (ref 8.4–10.5)
Chloride: 102 mEq/L (ref 96–112)
Creatinine, Ser: 0.84 mg/dL (ref 0.50–1.10)
GFR calc Af Amer: 76 mL/min — ABNORMAL LOW (ref >90)

## 2012-03-18 LAB — GLUCOSE, CAPILLARY
Glucose-Capillary: 116 mg/dL — ABNORMAL HIGH (ref 70–99)
Glucose-Capillary: 108 mg/dL — ABNORMAL HIGH (ref 70–99)
Glucose-Capillary: 138 mg/dL — ABNORMAL HIGH (ref 70–99)
Glucose-Capillary: 145 mg/dL — ABNORMAL HIGH (ref 70–99)
Glucose-Capillary: 105 mg/dL — ABNORMAL HIGH (ref 70–99)
Glucose-Capillary: 104 mg/dL — ABNORMAL HIGH (ref 70–99)

## 2012-03-18 MED ORDER — SODIUM CHLORIDE 0.9 % IJ SOLN
INTRAMUSCULAR | Status: AC
  Administered 2012-03-18: 10 mL
  Filled 2012-03-18: qty 10

## 2012-03-18 MED ORDER — OXYCODONE HCL 5 MG PO TABS: 5.0000 mg | ORAL_TABLET | ORAL | Status: DC | PRN

## 2012-03-18 NOTE — Progress Notes (Signed)
Nutrition Brief Note  Patient identified on the Malnutrition Screening Tool (MST) Report for unsure of weight loss.  Per review of usual weights in EMR, patient has not lost a significant amount of weight.  Wt Readings from Last 10 Encounters:  03/17/12 208 lb 5.4 oz (94.5 kg)  03/17/12 208 lb 5.4 oz (94.5 kg)  03/15/12 207 lb 9.6 oz (94.167 kg)  03/09/12 200 lb (90.719 kg)  02/20/12 191 lb 9.3 oz (86.9 kg)  02/20/12 191 lb 9.3 oz (86.9 kg)  02/09/12 196 lb (88.905 kg)  01/25/12 178 lb 11.2 oz (81.058 kg)  12/16/11 203 lb (92.08 kg)  10/14/11 207 lb 9.6 oz (94.167 kg)     Body mass index is 39.36 kg/(m^2). Patient meets criteria for class 2 obesity based on current BMI.   Current diet order is Clear Liquids, patient is tolerating well. Labs and medications reviewed.  Noted plans for discharge home soon.  No nutrition interventions warranted at this time. If nutrition issues arise, please consult RD.   Joaquin Courts, RD, LDN, CNSC Pager# [phone] After Hours Pager# [phone]

## 2012-03-18 NOTE — Discharge Summary (Signed)
[address]            [place] [address]          [phone]         Discharge Summary  Name: [name] DOB: 1932-11-29 79 y.o. MRN: [id number]   Admission Date: 03/17/2012 Discharge Date:     Admitting Diagnosis: Class III congestive heart failure   Discharge Diagnosis:  Class III congestive heart failure Ischemic cardiomyopathy  Past Medical History  Diagnosis Date  . HTN (hypertension)   . Hernia     Incisional hernia, umbilicus.  . H/O: hysterectomy   . CHB (complete heart block)     s/p pacer (Tenant => GT)  . Atrial fibrillation     on xarleto  . Depression   . Goiter   . DJD (degenerative joint disease)   . GERD (gastroesophageal reflux disease)   . Diverticulosis   . Cardiomyopathy secondary     Myoview 3/13: small inf scar apex to base with mild peri-infarct isch; EF 32%;   LHC 05/12/11: pLAD 20%, pCFX 20%, mid 30%, m+dRCA with mild luminal irregs; EF 35% with mid inf wal and inf-apical HK(no culprit for segmental WMA)  . CAD (coronary artery disease)     nonobs by [facility] 4/13  . Systolic CHF   . Pacemaker     upgrade to BiV PPM 02/19/12 (LV lead off)  . H/O hiatal hernia   . LBBB (left bundle branch block)      Procedures: LEFT THORACOTOMY FOR EPICARDIAL LEAD PLACEMENT x 2, REVISION OF BI-V SYSTEM, REMOVAL OF OLD TRANSVENOUS CORONARY SINUS LEAD on 03/17/2012   HPI:  The patient is a 79 y.o. female with ischemic cardiomyopathy who underwent insertion of a permanent pacemaker in 2003 for symptomatic bradycardia with complete heart block. She subsequently underwent generator change on 02/17/2010 when she reached beyond the ERI. She has now developed class III congestive heart failure symptoms with exertional dyspnea, fatigue, and lower extremity edema. She underwent insertion of a new left ventricular lead, removal of her old dual-chamber pacemaker generator, and insertion of a new biventricular pacemaker on 02/19/2012.  Unfortunately, the left ventricular lead was causing diaphragmatic stimulation which persisted despite changing it to multiple locations. The lead was left in place with the hope that it would subsequently seat itself in a location that would not cause diaphragmatic pacing. This has not happened and it has continued to cause diaphragmatic pacing when tested. She was therefore referred to Dr. Laneta Simmers for consideration of placing a left ventricular epicardial lead by a left thoracotomy approach. All risks, benefits and alternatives of surgery were explained in detail, and the patient agreed to proceed.     Hospital Course:  The patient was admitted to [facility] on 03/17/2012. The patient was taken to the operating room and underwent the above procedure.    The postoperative course has been uneventful.  Her chest tubes and lines have been removed.  She is afebrile and vital signs are stable. She is tolerating a regular diet.  We anticipate discharge home in the next 24-48 hours provided no acute changes occur.     Recent vital signs:  Filed Vitals:   03/18/12 0757  BP:   Pulse: 73  Temp:   Resp: 28    Recent laboratory studies:  CBC: Basename 03/18/12 0345 03/15/12 1341  WBC 12.9* 4.9  HGB 10.7*  10.8*  HCT 34.5* 34.9*  PLT 203 217   BMET:  Basename 03/18/12 0345 03/15/12 1341  NA 141 139  K 3.6 3.6  CL 102 102  CO2 29 27  GLUCOSE 123* 83  BUN 17 19  CREATININE 0.84 0.83  CALCIUM 9.6 9.8    PT/INR:  Basename 03/15/12 1341  LABPROT 17.5*  INR 1.48     Discharge Medications:     Medication List     As of 03/18/2012  9:51 AM    TAKE these medications         acetaminophen 500 MG tablet   Commonly known as: TYLENOL   Take 500 mg by mouth every 6 (six) hours as needed. For pain      aspirin EC 81 MG tablet   Take 81 mg by mouth every morning.      atorvastatin 20 MG tablet   Commonly known as: LIPITOR   Take 20 mg by mouth every morning.      carvedilol 25 MG  tablet   Commonly known as: COREG   Take 25 mg by mouth 2 (two) times daily with a meal.      ergocalciferol 50000 UNITS capsule   Commonly known as: VITAMIN D2   Take 50,000 Units by mouth every Wednesday.      ferrous sulfate 325 (65 FE) MG tablet   Take 1 tablet (325 mg total) by mouth 3 (three) times daily with meals.      furosemide 40 MG tablet   Commonly known as: LASIX   Take 40 mg by mouth every morning.      IMODIUM PO   Take 1 tablet by mouth daily.      lansoprazole 15 MG capsule   Commonly known as: PREVACID   Take 15 mg by mouth every morning.      nitroGLYCERIN 0.4 MG SL tablet   Commonly known as: NITROSTAT   Place 1 tablet (0.4 mg total) under the tongue every 5 (five) minutes as needed for chest pain.      oxyCODONE 5 MG immediate release tablet   Commonly known as: Oxy IR/ROXICODONE   Take 1-2 tablets (5-10 mg total) by mouth every 4 (four) hours as needed for pain.      potassium chloride 10 MEQ tablet   Commonly known as: K-DUR   Take 1 tablet (10 mEq total) by mouth 2 (two) times daily.      Rivaroxaban 20 MG Tabs   Commonly known as: XARELTO   Take 20 mg by mouth daily with supper.      traMADol 50 MG tablet   Commonly known as: ULTRAM   Take 100 mg by mouth 4 (four) times daily as needed. Joint pain         Discharge Instructions:  The patient is to refrain from driving, heavy lifting or strenuous activity.  May shower daily and clean incisions with soap and water.  May resume regular diet.   Follow Up:      Discharge Orders    Future Appointments: Provider: Department: Dept Phone: Center:   03/25/2012 11:00 AM Jesus Genera Nurse [facility] [place] [phone] [facility]   04/19/2012 11:00 AM [name], MD [facility] [phone] [facility]      Follow-up Information    Follow up with [name], MD. On 03/22/2012. (Have a chest x-ray at 10:00, then see MD at  11:00)    Contact information:  [address] [employer] [address] [place] [address] [phone]       Follow up with [facility]. On 03/25/2012. (RN will remove sutures at 11:00)       Schedule an appointment as soon as possible for a visit with Lewayne Bunting, MD.   Contact information:   [address] [place] [address] [phone]           Adella Hare 03/18/2012, 9:51 AM

## 2012-03-18 NOTE — Progress Notes (Addendum)
                   [address]            [place] [address]          [phone]    1 Day Post-Op Procedure(s) (LRB): EPICARDIAL PACING LEAD PLACEMENT (Left)  Subjective: Patient with pain at chest tube site.  Objective: Vital signs in last 24 hours: Temp:  [97.6 F (36.4 C)-100.4 F (38 C)] 98 F (36.7 C) (02/07 0712) Pulse Rate:  [69-73] 73  (02/07 0757) Cardiac Rhythm:  [-] Ventricular paced (02/07 0315) Resp:  [12-28] 28  (02/07 0757) BP: (108-165)/(45-107) 158/70 mmHg (02/07 0315) SpO2:  [83 %-100 %] 94 % (02/07 0820) Arterial Line BP: (166-221)/(54-107) 221/76 mmHg (02/07 0757) Weight:  [94.5 kg (208 lb 5.4 oz)] 94.5 kg (208 lb 5.4 oz) (02/06 1445)      Intake/Output from previous day: 02/06 0701 - 02/07 0700 In: 1410.7 [P.O.:220; I.V.:1090.7; IV Piggyback:100] Out: 1150 [Urine:740; Chest Tube:410]   Physical Exam:  Cardiovascular: RRR Pulmonary: Diminished at bases Abdomen: Soft, non tender, bowel sounds present. Extremities:Bilateral lower extremity edema. Wounds: Dressing is clean and dry. Chest Tube: to suction and no air leak  Lab Results: CBC: Basename 03/18/12 0345 03/15/12 1341  WBC 12.9* 4.9  HGB 10.7* 10.8*  HCT 34.5* 34.9*  PLT 203 217   BMET:  Basename 03/18/12 0345 03/15/12 1341  NA 141 139  K 3.6 3.6  CL 102 102  CO2 29 27  GLUCOSE 123* 83  BUN 17 19  CREATININE 0.84 0.83  CALCIUM 9.6 9.8    PT/INR:  Basename 03/15/12 1341  LABPROT 17.5*  INR 1.48   ABG:  INR: Will add last result for INR, ABG once components are confirmed Will add last 4 CBG results once components are confirmed  Assessment/Plan:  1. CV - Hypertensive. On Coreg 25 bid. 2.  Pulmonary -Chest tube with 410 cc last 24 hours. Chest tube is to water seal and there is no air leak. No CXR taken. Will order for am. 3. H and H stable at 10.7 and 34.5 4.Remove a line 5.Will leave foley for now secondary to mobility issues  ZIMMERMAN,DONIELLE  MPA-C 03/18/2012,8:46 AM   Chart reviewed, patient examined, agree with above. CT to water seal. Remove chest tube, central line and foley in am to decrease risk of infection of her Bi V pacing system.

## 2012-03-18 NOTE — Op Note (Signed)
[name], [name]                ACCOUNT NO.:  [id number]  MEDICAL RECORD NO.:  [id number]  LOCATION:  3315                         FACILITY:  [facility]  PHYSICIAN:  Evelene Croon, M.D.     DATE OF BIRTH:  [birth year]  DATE OF PROCEDURE:  03/17/2012 DATE OF DISCHARGE:                              OPERATIVE REPORT   PREOPERATIVE DIAGNOSIS:  Ischemic cardiomyopathy with congestive heart failure, gastroesophageal reflux disease.  POSTOPERATIVE DIAGNOSIS:  Ischemic cardiomyopathy with congestive heart failure, gastroesophageal reflux disease.  OPERATIVE PROCEDURE:  Left thoracotomy, insertion of 2 left ventricular epicardial pacing leads,  revision of biventricular pacemaker system with removal of nonfunctional transvenous coronary sinus lead and revision of the pacemaker pocket in connection of epicardial ventricular pacing lead to the pacemaker.  ATTENDING SURGEON:  Evelene Croon, M.D.  ANESTHESIA:  General endotracheal.  CLINICAL HISTORY:  This patient is a 79 year old woman with ischemic cardiomyopathy, who underwent insertion of permanent pacemaker in 2003 for symptomatic bradycardia and complete heart block.  She subsequently had a generator change on February 17, 2010 for ERI.  She now has Class 3 congestive heart failure symptoms with exertional dyspnea, fatigue, lower extremity edema.  She underwent insertion of a new left ventricular pacing lead with removal of her old dual-chamber pacemaker generator and insertion of a new biventricular pacemaker on February 19, 2012, by Dr. Sharrell Ku.  Then 4 showing the left ventricular lead is causing diaphragmatic stimulation which persisted despite changing it to multiple locations.  The lead was left in place with a hope that it would subsequently seat itself in a location that would not cause diaphragmatic pacing.  This is not happened and it has continued to cause diaphragmatic pacing when tested.  Therefore, it was felt  that placement of epicardial left ventricular pacing leads through a left thoracotomy approach would be the best option.  I discussed the operation with the patient and her husband including alternatives, benefits, and risks, including the possibility that a biventricular pacing system may not improve her symptoms.  We discussed the other risks of surgery including, but not limited to bleeding, blood transfusion, infection, injury to the heart, and malfunction of the pacemaker system, and she understood all this and agreed to proceed.  OPERATIVE PROCEDURE:  The patient was seen in the preoperative holding area.  Proper patient, proper operation, proper operative side were confirmed after reviewing her x-rays and discussion with the patient. Left side of the chest was signed by me.  Preoperative intravenous antibiotics were given.  She was taken back to the operating room and placed on the table in supine position.  After induction of general endotracheal anesthesia using a double-lumen tube, a Foley catheter was placed in bladder using sterile technique.  She was positioned in the supine position.  The left breast was retracted towards the right and taped into place.  Then, the left chest and abdomen were prepped with Betadine soap and solution, draped in the usual sterile manner.  Time- out was taken.  Proper patient, proper operation, proper operative side were confirmed with nursing and anesthesia staff after reviewing her x- rays again in the operating room.  Then, the left chest  was entered through a short anterolateral thoracotomy incision.  The pleural space was entered through the 6th intercostal space.  The heart was lying right up against the chest wall.  The chest retractor was placed.  The pericardium was opened longitudinally, well anterior to the phrenic nerve.  There were a couple pf  obtuse marginal branches that were identified.  These were avoided.  The site was chosen  for insertion of the epicardial lead.  Both leads.  Both were placed were Medtronic leads with model number [id number].  One lead ended in January 2002 V.  The other lead ended on January 2003 V.  The first screw and lead was inserted into the epicardium.  It was then connected to the analyzer and had satisfactory sensing, impedance, and threshold.  Then, the second lead was inserted slightly more distally on the lateral wall and was also connected the pacing system analyzer.  This had satisfactory sensing, impedance, and threshold.  Then, the previous pacemaker incision was opened and carried down through the subcutaneous tissues and electrocautery.  The pacing leads were identified and avoided.  The generator had been repositioned to a submuscular pocket by Dr. Ladona Ridgel which made it more difficult to expose.  The pectoralis major was split along the plane of its fibers and the pacemaker pocket was opened.  The generator was removed.  The transvenous left ventricular lead was disconnected.  The suture sleeve was freed and then the sleeve was removed without difficulty.  It was inspected and the entire lead was removed.  Then, the 2 epicardial leads were tunneled in the subcutaneous plane up to the pacemaker pocket, and then the lead which had the best pacing parameters was connected to the left ventricular port on the pacemaker.  The other lead was capped and coiled in the pocket.  The generator was returned of the pacemaker pocket.  It was checked again by the Medtronic rep and the biventricular pacing resulted in significant narrowing of the complex.  There is satisfactory thresholds and sensitivity.  The pacemaker pocket was irrigated with saline solution. There was complete hemostasis.  The pectoralis muscle was then reapproximated with continuous 0 Vicryl suture.  The subcutaneous tissue was closed with continuous 2-0 Vicryl and skin with 3-0 Vicryl subcuticular closure.  Then, a  28-French chest tube was brought through a separate stab incision and positioned in the posterior pleural space up to the apex.  The ribs were reapproximated with #2 Vicryl pericostal sutures.  The muscle was reapproximated with continuous #1 Vicryl suture.  Subcutaneous tissue was closed with continuous 2-0 Vicryl and skin with a 3-0 Vicryl subcuticular closure.  The sponge, needle, and instrument counts were correct according to the scrub nurse.  Dry sterile dressing was applied over the incisions around the chest tube which was hooked to Pleur-Evac suction.  The patient remained hemodynamically stable was awakened, extubated, and transported to the Post Anesthesia Care Unit in satisfactory stable condition.     Evelene Croon, M.D.     BB/MEDQ  D:  03/18/2012  T:  03/18/2012  Job:  [id number]

## 2012-03-19 ENCOUNTER — Inpatient Hospital Stay (HOSPITAL_COMMUNITY): Payer: Medicare Other

## 2012-03-19 LAB — GLUCOSE, CAPILLARY: Glucose-Capillary: 100 mg/dL — ABNORMAL HIGH (ref 70–99)

## 2012-03-19 MED ORDER — LEVALBUTEROL HCL 0.63 MG/3ML IN NEBU
0.6300 mg | INHALATION_SOLUTION | Freq: Two times a day (BID) | RESPIRATORY_TRACT | Status: DC
  Administered 2012-03-19: 0.63 mg via RESPIRATORY_TRACT
  Filled 2012-03-19 (×4): qty 3

## 2012-03-19 MED ORDER — LEVALBUTEROL HCL 0.63 MG/3ML IN NEBU
0.6300 mg | INHALATION_SOLUTION | Freq: Three times a day (TID) | RESPIRATORY_TRACT | Status: DC
  Filled 2012-03-19 (×2): qty 3

## 2012-03-19 NOTE — Progress Notes (Signed)
Pt to TX to 2003, VSS, called report.

## 2012-03-19 NOTE — Progress Notes (Addendum)
2 Days Post-Op Procedure(s) (LRB): placement of LV epicardial leads (Left) Subjective:  [name] complains of pain this morning.    Objective: Vital signs in last 24 hours: Temp:  [97.9 F (36.6 C)-99.2 F (37.3 C)] 98.5 F (36.9 C) (02/08 0736) Pulse Rate:  [71-76] 73 (02/08 0736) Cardiac Rhythm:  [-] Ventricular paced (02/08 0800) Resp:  [14-24] 16 (02/08 0736) BP: (114-158)/(52-74) 158/65 mmHg (02/08 0736) SpO2:  [93 %-98 %] 95 % (02/08 0736)  Intake/Output from previous day: 02/07 0701 - 02/08 0700 In: 1420 [P.O.:940; I.V.:480] Out: 950 [Urine:800; Chest Tube:150] Intake/Output this shift: Total I/O In: -  Out: 50 [Chest Tube:50]  General appearance: alert, cooperative and no distress Heart: regular rate and rhythm Lungs: corase bilaterally Abdomen: soft, non-tender; bowel sounds normal; no masses,  no organomegaly Wound: clean and dry  Lab Results:  Recent Labs  03/18/12 0345  WBC 12.9*  HGB 10.7*  HCT 34.5*  PLT 203   BMET:  Recent Labs  03/18/12 0345  NA 141  K 3.6  CL 102  CO2 29  GLUCOSE 123*  BUN 17  CREATININE 0.84  CALCIUM 9.6    PT/INR: No results found for this basename: LABPROT, INR,  in the last 72 hours ABG    Component Value Date/Time   PHART 7.477* 03/15/2012 1340   HCO3 26.5* 03/15/2012 1340   TCO2 27.6 03/15/2012 1340   O2SAT 97.2 03/15/2012 1340   CBG (last 3)   Recent Labs  03/18/12 2043 03/19/12 0004 03/19/12 0353  GLUCAP 104* 107* 100*    Assessment/Plan: S/P Procedure(s) (LRB): placement of LV epicardial leads (Left)  1. Chest tube- on water seal, no air leak, 150 cc output will d/c 2. D/C Central Line 3. Pulm- + congestion, + atelectasis off oxygen, continue use of IS 4. Dispo- patient stable, will d/c chest tube, likely d/c in AM    LOS: 2 days    [name], [name] 03/19/2012  Patient seen and examined. Agree with above. Transfer to 2000

## 2012-03-20 ENCOUNTER — Inpatient Hospital Stay (HOSPITAL_COMMUNITY): Payer: Medicare Other

## 2012-03-20 NOTE — Progress Notes (Addendum)
3 Days Post-Op Procedure(s) (LRB): placement of LV epicardial leads (Left) Subjective:  [name] is feeling better today.  She does complain of some incisional pain.   Objective: Vital signs in last 24 hours: Temp:  [97.3 F (36.3 C)-98.1 F (36.7 C)] 97.4 F (36.3 C) (02/09 0452) Pulse Rate:  [71-74] 73 (02/09 0643) Cardiac Rhythm:  [-] Ventricular paced (02/08 2353) Resp:  [16-19] 18 (02/09 0452) BP: (126-165)/(68-81) 149/80 mmHg (02/09 0643) SpO2:  [95 %-100 %] 97 % (02/09 0452)  Intake/Output from previous day: 02/08 0701 - 02/09 0700 In: -  Out: 50 [Chest Tube:50]  General appearance: alert, cooperative and no distress Heart: regular rate and rhythm Lungs: clear to auscultation bilaterally Abdomen: soft, non-tender; bowel sounds normal; no masses,  no organomegaly Wound: clean and dry  Lab Results:  Recent Labs  03/18/12 0345  WBC 12.9*  HGB 10.7*  HCT 34.5*  PLT 203   BMET:  Recent Labs  03/18/12 0345  NA 141  K 3.6  CL 102  CO2 29  GLUCOSE 123*  BUN 17  CREATININE 0.84  CALCIUM 9.6    PT/INR: No results found for this basename: LABPROT, INR,  in the last 72 hours ABG    Component Value Date/Time   PHART 7.477* 03/15/2012 1340   HCO3 26.5* 03/15/2012 1340   TCO2 27.6 03/15/2012 1340   O2SAT 97.2 03/15/2012 1340   CBG (last 3)   Recent Labs  03/19/12 0004 03/19/12 0353 03/19/12 0802  GLUCAP 107* 100* 96    Assessment/Plan: S/P Procedure(s) (LRB): placement of LV epicardial leads (Left)  1. CV- Paced 2. Pulm- off oxygen, no pneumothorax post chest tube removal 3. Dispo- patient doing well, will d/c home today   LOS: 3 days    [name], ERIN 03/20/2012  Patient seen and examined, agree with above

## 2012-03-21 ENCOUNTER — Encounter (HOSPITAL_COMMUNITY): Payer: Self-pay | Admitting: Surgery

## 2012-03-22 ENCOUNTER — Ambulatory Visit (INDEPENDENT_AMBULATORY_CARE_PROVIDER_SITE_OTHER): Payer: Self-pay

## 2012-03-22 DIAGNOSIS — I359 Nonrheumatic aortic valve disorder, unspecified: Secondary | ICD-10-CM

## 2012-03-22 DIAGNOSIS — Z4802 Encounter for removal of sutures: Secondary | ICD-10-CM

## 2012-03-22 NOTE — Progress Notes (Signed)
Removed 1 suture from chest tube site. No signs of infection and pt tolerated well. 

## 2012-03-25 ENCOUNTER — Other Ambulatory Visit: Payer: Self-pay | Admitting: *Deleted

## 2012-03-25 DIAGNOSIS — G8918 Other acute postprocedural pain: Secondary | ICD-10-CM

## 2012-03-25 MED ORDER — HYDROCODONE-ACETAMINOPHEN 7.5-325 MG PO TABS: 1.0000 | ORAL_TABLET | Freq: Four times a day (QID) | ORAL | Status: DC | PRN

## 2012-03-25 NOTE — Telephone Encounter (Signed)
error 

## 2012-03-31 ENCOUNTER — Telehealth: Payer: Self-pay | Admitting: Internal Medicine

## 2012-03-31 NOTE — Telephone Encounter (Signed)
03-31-12 lmm @ 1040am for pt to call to set up pacer wound ck next week/mt

## 2012-04-07 ENCOUNTER — Encounter: Payer: Self-pay | Admitting: Internal Medicine

## 2012-04-07 ENCOUNTER — Ambulatory Visit (INDEPENDENT_AMBULATORY_CARE_PROVIDER_SITE_OTHER): Payer: Medicare Other | Admitting: *Deleted

## 2012-04-07 ENCOUNTER — Other Ambulatory Visit: Payer: Self-pay | Admitting: Internal Medicine

## 2012-04-07 DIAGNOSIS — I5022 Chronic systolic (congestive) heart failure: Secondary | ICD-10-CM

## 2012-04-07 DIAGNOSIS — I4891 Unspecified atrial fibrillation: Secondary | ICD-10-CM

## 2012-04-07 LAB — PACEMAKER DEVICE OBSERVATION
DEVICE MODEL PM: 2874471
LV LEAD THRESHOLD: 0.75 V
RV LEAD IMPEDENCE PM: 450 Ohm
RV LEAD THRESHOLD: 1.25 V

## 2012-04-07 NOTE — Progress Notes (Signed)
Wound check-PPM 

## 2012-04-11 ENCOUNTER — Other Ambulatory Visit: Payer: Self-pay | Admitting: *Deleted

## 2012-04-11 DIAGNOSIS — G8918 Other acute postprocedural pain: Secondary | ICD-10-CM

## 2012-04-11 MED ORDER — HYDROCODONE-ACETAMINOPHEN 7.5-325 MG PO TABS: 1.0000 | ORAL_TABLET | Freq: Four times a day (QID) | ORAL | Status: DC | PRN

## 2012-04-19 ENCOUNTER — Other Ambulatory Visit: Payer: Self-pay | Admitting: *Deleted

## 2012-04-19 ENCOUNTER — Ambulatory Visit
Admission: RE | Admit: 2012-04-19 | Discharge: 2012-04-19 | Disposition: A | Discharge status: Home / Self Care | Payer: Medicare Other | Source: Ambulatory Visit | Attending: Surgery | Admitting: Surgery

## 2012-04-19 ENCOUNTER — Ambulatory Visit (INDEPENDENT_AMBULATORY_CARE_PROVIDER_SITE_OTHER): Payer: Self-pay | Admitting: Surgery

## 2012-04-19 ENCOUNTER — Encounter: Payer: Self-pay | Admitting: Surgery

## 2012-04-19 ENCOUNTER — Other Ambulatory Visit: Payer: Self-pay

## 2012-04-19 VITALS — BP 127/79 | HR 96 | Resp 20 | Ht 61.0 in | Wt 208.0 lb

## 2012-04-19 DIAGNOSIS — I2510 Atherosclerotic heart disease of native coronary artery without angina pectoris: Secondary | ICD-10-CM

## 2012-04-19 DIAGNOSIS — Z9889 Other specified postprocedural states: Secondary | ICD-10-CM

## 2012-04-19 DIAGNOSIS — I509 Heart failure, unspecified: Secondary | ICD-10-CM

## 2012-04-19 DIAGNOSIS — Z09 Encounter for follow-up examination after completed treatment for conditions other than malignant neoplasm: Secondary | ICD-10-CM

## 2012-04-19 DIAGNOSIS — J90 Pleural effusion, not elsewhere classified: Secondary | ICD-10-CM

## 2012-04-19 NOTE — Progress Notes (Signed)
[address]       [name] [address]             [phone]     HPI:  Patient returns for routine postoperative follow-up having undergone left thoracotomy with insertion of 2 left ventricular epicardial pacing leads and revision of her biventricular pacemaker system with removal of a nonfunctional transvenous coronary sinus lead and revision of the pacemaker pocket on 03/17/2012. The patient's early postoperative recovery while in the hospital was notable for an uncomplicated postoperative course. Since hospital discharge the patient reports that she was readmitted [facility] about 1-2 weeks postoperatively for acute shortness of breath. According to her and her husband she developed a large amount of swelling in her legs over a couple day period and was also found to have a large left pleural effusion on chest x-ray. She awoke overnight with shortness of breath and her husband said she looked bad so he called EMS who took her to the nearest hospital. She was admitted to [facility] for about 5 days and underwent left thoracentesis removing a large amount of fluid according to the patient.She was apparently treated with diuretics and her peripheral edema markedly improved. Since discharge there a few weeks ago she said she is feeling fairly well. She denies any shortness of breath. She does have some pain in both shoulders and also has had some feeling of abdominal bloating and nausea at times.   Current Outpatient Prescriptions  Medication Sig Dispense Refill  . acetaminophen (TYLENOL) 500 MG tablet Take 500 mg by mouth every 6 (six) hours as needed. For pain      . ADVAIR DISKUS 250-50 MCG/DOSE AEPB Inhale 1 puff into the lungs 2 (two) times daily.       [name] aspirin EC 81 MG tablet Take 81 mg by mouth every morning.       [name] atorvastatin (LIPITOR) 20 MG tablet Take 20 mg by mouth every morning.      . carvedilol (COREG) 25 MG tablet Take 25 mg by  mouth 2 (two) times daily with a meal.        . COMBIVENT RESPIMAT 20-100 MCG/ACT AERS respimat Inhale 1 puff into the lungs every 6 (six) hours as needed for wheezing or shortness of breath.       . DULoxetine (CYMBALTA) 30 MG capsule Take 30 mg by mouth daily.       . ergocalciferol (VITAMIN D2) 50000 UNITS capsule Take 50,000 Units by mouth every Wednesday.       . ferrous sulfate 325 (65 FE) MG tablet Take 1 tablet (325 mg total) by mouth 3 (three) times daily with meals.  30 tablet  0  . furosemide (LASIX) 40 MG tablet Take 40 mg by mouth every morning.      . lansoprazole (PREVACID) 15 MG capsule Take 15 mg by mouth every morning.       [name] lisinopril (PRINIVIL,ZESTRIL) 10 MG tablet Take 10 mg by mouth daily.       . Loperamide HCl (IMODIUM PO) Take 1 tablet by mouth daily.      [name] losartan-hydrochlorothiazide (HYZAAR) 100-12.5 MG per tablet       . NITROSTAT 0.4 MG SL tablet Place 0.4 mg under the tongue every 5 (five) minutes as needed.       [name] oxyCODONE (OXY IR/ROXICODONE) 5 MG immediate release tablet Take 1-2 tablets (5-10 mg total) by mouth every 4 (four) hours as needed  for pain.  30 tablet  0  . potassium chloride (K-DUR) 10 MEQ tablet Take 1 tablet (10 mEq total) by mouth 2 (two) times daily.  30 tablet  0  . Rivaroxaban (XARELTO) 20 MG TABS Take 20 mg by mouth daily with supper.       . traMADol (ULTRAM) 50 MG tablet Take 100 mg by mouth 4 (four) times daily as needed. Joint pain       No current facility-administered medications for this visit.    Physical Exam: BP 127/79  Pulse 96  Resp 20  Ht 5\' 1"  (1.549 m)  Wt 208 lb (94.348 kg)  BMI 39.32 kg/m2  SpO2 95% She looks well. The pacemaker pocket incision is healing well. The left thoracotomy incision is healing well. Lung exam reveals decreased breath sounds over the left lower lobe. Cardiac exam shows regular rate and rhythm with normal heart sounds. There is mild bilateral ankle edema.  Diagnostic  Tests:  *RADIOLOGY REPORT*   Clinical Data: Shortness of breath, pain in the shoulders   CHEST - 2 VIEW   Comparison: Chest x-ray of 03/20/2012   Findings: There is now more opacity at the left lung base consistent with interval development of a left pleural effusion with left basilar atelectasis.  The right lung is clear. Cardiomegaly is stable.  Permanent pacemaker remains with epicardial leads.   IMPRESSION: New left pleural effusion with left basilar atelectasis.     Original Report Authenticated By: Dwyane Dee, M.D.     Impression:  She has a recurrent left pleural effusion which is now moderate. There is associated left lower lobe atelectasis. It is unclear if this effusion is related to the left thoracotomy or her congestive heart failure, or both. She has minimal lower extremity edema at this time so I think her heart failure is fairly well managed. I recommended that we do an ultrasound-guided left thoracentesis to try to completely remove the left pleural effusion. If this recurs then she may need a Pleurx catheter placed. This left pleural effusion could be the reason for her shoulder pain on the left and may also cause her some nausea and abdominal bloating due to downward pressure on the diaphragm.  Plan:  I will arrange ultrasound-guided left thoracentesis by interventional radiology. I will plan to see her back 2 weeks afterwards with a chest x-ray to followup on the left pleural effusion. She will continue followup with Dr. Sharrell Ku for her cardiology care.

## 2012-04-21 ENCOUNTER — Ambulatory Visit (HOSPITAL_COMMUNITY)
Admission: RE | Admit: 2012-04-21 | Discharge: 2012-04-21 | Disposition: A | Discharge status: Home / Self Care | Payer: Medicare Other | Source: Ambulatory Visit | Attending: Radiology | Admitting: Radiology

## 2012-04-21 ENCOUNTER — Ambulatory Visit (HOSPITAL_COMMUNITY)
Admission: RE | Admit: 2012-04-21 | Discharge: 2012-04-21 | Disposition: A | Discharge status: Home / Self Care | Payer: Medicare Other | Source: Ambulatory Visit | Attending: Surgery | Admitting: Surgery

## 2012-04-21 DIAGNOSIS — J90 Pleural effusion, not elsewhere classified: Secondary | ICD-10-CM | POA: Insufficient documentation

## 2012-04-21 NOTE — Procedures (Signed)
US guided L thora  400 cc bloody fluid cxr pending

## 2012-04-22 ENCOUNTER — Other Ambulatory Visit: Payer: Self-pay

## 2012-04-22 ENCOUNTER — Other Ambulatory Visit: Payer: Self-pay | Admitting: *Deleted

## 2012-04-22 DIAGNOSIS — G8918 Other acute postprocedural pain: Secondary | ICD-10-CM

## 2012-04-22 MED ORDER — HYDROCODONE-ACETAMINOPHEN 7.5-325 MG PO TABS: 1.0000 | ORAL_TABLET | Freq: Four times a day (QID) | ORAL | Status: DC | PRN

## 2012-04-22 NOTE — Telephone Encounter (Signed)
RX refill for Norco 7.5/325 mg called to pt's pharm.

## 2012-05-02 ENCOUNTER — Other Ambulatory Visit: Payer: Self-pay | Admitting: *Deleted

## 2012-05-02 DIAGNOSIS — I2510 Atherosclerotic heart disease of native coronary artery without angina pectoris: Secondary | ICD-10-CM

## 2012-05-04 ENCOUNTER — Encounter: Payer: Self-pay | Admitting: Surgery

## 2012-05-04 ENCOUNTER — Ambulatory Visit
Admission: RE | Admit: 2012-05-04 | Discharge: 2012-05-04 | Disposition: A | Discharge status: Home / Self Care | Payer: Medicare Other | Source: Ambulatory Visit | Attending: Surgery | Admitting: Surgery

## 2012-05-04 ENCOUNTER — Ambulatory Visit (INDEPENDENT_AMBULATORY_CARE_PROVIDER_SITE_OTHER): Payer: Medicare Other | Admitting: Surgery

## 2012-05-04 VITALS — BP 109/77 | HR 96 | Resp 20 | Ht 61.0 in | Wt 208.0 lb

## 2012-05-04 DIAGNOSIS — I2510 Atherosclerotic heart disease of native coronary artery without angina pectoris: Secondary | ICD-10-CM

## 2012-05-04 DIAGNOSIS — J90 Pleural effusion, not elsewhere classified: Secondary | ICD-10-CM

## 2012-05-04 DIAGNOSIS — Z9889 Other specified postprocedural states: Secondary | ICD-10-CM

## 2012-05-04 DIAGNOSIS — G8918 Other acute postprocedural pain: Secondary | ICD-10-CM

## 2012-05-04 DIAGNOSIS — Z09 Encounter for follow-up examination after completed treatment for conditions other than malignant neoplasm: Secondary | ICD-10-CM

## 2012-05-04 MED ORDER — HYDROCODONE-ACETAMINOPHEN 7.5-325 MG PO TABS: 1.0000 | ORAL_TABLET | Freq: Four times a day (QID) | ORAL | Status: DC | PRN

## 2012-05-04 NOTE — Progress Notes (Signed)
[address]       [name] [address]             [phone]       HPI:  Patient returned for routine postoperative follow-up on 04/19/2012 having undergone left thoracotomy with insertion of 2 left ventricular epicardial pacing leads and revision of her biventricular pacemaker system with removal of a nonfunctional transvenous coronary sinus lead and revision of the pacemaker pocket on 03/17/2012. Since hospital discharge the patient reported that she was readmitted to [facility] about 1-2 weeks postoperatively for acute shortness of breath. According to her and her husband she developed a large amount of swelling in her legs over a couple day period and was also found to have a large left pleural effusion on chest x-ray. She awoke overnight with shortness of breath and her husband said she looked bad so he called EMS who took her to the nearest hospital. She was admitted to [facility] for about 5 days and underwent left thoracentesis removing a large amount of fluid according to the patient. She was apparently treated with diuretics and her peripheral edema markedly improved. She denied any shortness of breath at that time but did have some pain in her shoulders as well as some abdominal bloating and nausea. Chest x-ray showed a recurrent moderate left pleural effusion. I thought it best to do a left thoracentesis. This was performed by interventional radiology and removed about 400 cc of serosanguineous fluid. The patient said that since the thoracentesis she has continued to improve. She is walking daily without chest pain or shortness of breath. Her shoulder pain is resolved and her nausea has resolved. Her husband feels that she is almost back to normal.   Current Outpatient Prescriptions  Medication Sig Dispense Refill  . acetaminophen (TYLENOL) 500 MG tablet Take 500 mg by mouth every 6 (six) hours as needed. For pain      . ADVAIR DISKUS 250-50  MCG/DOSE AEPB Inhale 1 puff into the lungs 2 (two) times daily.       [name] aspirin EC 81 MG tablet Take 81 mg by mouth every morning.       [name] atorvastatin (LIPITOR) 20 MG tablet Take 20 mg by mouth every morning.      . carvedilol (COREG) 25 MG tablet Take 25 mg by mouth 2 (two) times daily with a meal.        . COMBIVENT RESPIMAT 20-100 MCG/ACT AERS respimat Inhale 1 puff into the lungs every 6 (six) hours as needed for wheezing or shortness of breath.       . DULoxetine (CYMBALTA) 30 MG capsule Take 30 mg by mouth daily.       . ergocalciferol (VITAMIN D2) 50000 UNITS capsule Take 50,000 Units by mouth every Wednesday.       . ferrous sulfate 325 (65 FE) MG tablet Take 1 tablet (325 mg total) by mouth 3 (three) times daily with meals.  30 tablet  0  . furosemide (LASIX) 40 MG tablet Take 40 mg by mouth every morning.      [name] HYDROcodone-acetaminophen (NORCO) 7.5-325 MG per tablet Take 1 tablet by mouth every 6 (six) hours as needed for pain.  40 tablet  0  . lansoprazole (PREVACID) 15 MG capsule Take 15 mg by mouth every morning.       [name] lisinopril (PRINIVIL,ZESTRIL) 10 MG tablet Take 10 mg by mouth daily.       [name]  Loperamide HCl (IMODIUM PO) Take 1 tablet by mouth daily.      [name] losartan-hydrochlorothiazide (HYZAAR) 100-12.5 MG per tablet Take 1 tablet by mouth daily.       [name] NITROSTAT 0.4 MG SL tablet Place 0.4 mg under the tongue every 5 (five) minutes as needed.       . potassium chloride (K-DUR) 10 MEQ tablet Take 1 tablet (10 mEq total) by mouth 2 (two) times daily.  30 tablet  0  . Rivaroxaban (XARELTO) 20 MG TABS Take 20 mg by mouth daily with supper.       . traMADol (ULTRAM) 50 MG tablet Take 100 mg by mouth 4 (four) times daily as needed. Joint pain       No current facility-administered medications for this visit.    Physical Exam: BP 109/77  Pulse 96  Resp 20  Ht 5\' 1"  (1.549 m)  Wt 208 lb (94.348 kg)  BMI 39.32 kg/m2  SpO2 96% She looks well. Lung exam reveals slight  decreased breath sounds at the left base. The left thoracotomy incision is well-healed. The defibrillator pocket incision is well-healed. Cardiac exam shows a regular rate and rhythm with normal heart sounds. There is no peripheral edema.  Diagnostic Tests:  Chest x-ray today shows a very small residual left pleural effusion. The size of the effusion is smaller than on the chest x-ray done immediately after thoracentesis. There is no significant atelectasis.  Impression:  Overall I think she is progressing well. Her congestive heart failure seems to be under good control and symptomatically she is much better than she was prior to her surgery.  Plan:  She will continue her current medications and will followup with Dr. Ladona Ridgel.

## 2012-05-06 ENCOUNTER — Ambulatory Visit: Payer: Self-pay | Admitting: Pharmacist

## 2012-05-06 DIAGNOSIS — I4891 Unspecified atrial fibrillation: Secondary | ICD-10-CM

## 2012-05-06 DIAGNOSIS — Z7901 Long term (current) use of anticoagulants: Secondary | ICD-10-CM

## 2012-05-20 ENCOUNTER — Encounter: Payer: Self-pay | Admitting: Internal Medicine

## 2012-05-24 ENCOUNTER — Encounter: Payer: Self-pay | Admitting: *Deleted

## 2012-05-24 DIAGNOSIS — I509 Heart failure, unspecified: Secondary | ICD-10-CM

## 2012-05-24 DIAGNOSIS — I10 Essential (primary) hypertension: Secondary | ICD-10-CM | POA: Insufficient documentation

## 2012-05-26 ENCOUNTER — Encounter: Payer: Self-pay | Admitting: Family Medicine

## 2012-05-26 ENCOUNTER — Ambulatory Visit (INDEPENDENT_AMBULATORY_CARE_PROVIDER_SITE_OTHER): Payer: Medicare Other | Admitting: Family Medicine

## 2012-05-26 VITALS — BP 129/75 | HR 69 | Wt 185.0 lb

## 2012-05-26 DIAGNOSIS — M25511 Pain in right shoulder: Secondary | ICD-10-CM

## 2012-05-26 DIAGNOSIS — E785 Hyperlipidemia, unspecified: Secondary | ICD-10-CM

## 2012-05-26 DIAGNOSIS — D649 Anemia, unspecified: Secondary | ICD-10-CM

## 2012-05-26 DIAGNOSIS — I10 Essential (primary) hypertension: Secondary | ICD-10-CM

## 2012-05-26 DIAGNOSIS — R5381 Other malaise: Secondary | ICD-10-CM

## 2012-05-26 DIAGNOSIS — M25519 Pain in unspecified shoulder: Secondary | ICD-10-CM

## 2012-05-26 DIAGNOSIS — E559 Vitamin D deficiency, unspecified: Secondary | ICD-10-CM

## 2012-05-26 DIAGNOSIS — G4700 Insomnia, unspecified: Secondary | ICD-10-CM

## 2012-05-26 MED ORDER — TIOTROPIUM BROMIDE MONOHYDRATE 18 MCG IN CAPS: 18.0000 ug | ORAL_CAPSULE | Freq: Every day | RESPIRATORY_TRACT | Status: DC

## 2012-05-26 MED ORDER — ATORVASTATIN CALCIUM 40 MG PO TABS: ORAL_TABLET | ORAL | Status: DC

## 2012-05-26 MED ORDER — CYCLOBENZAPRINE HCL 5 MG PO TABS: ORAL_TABLET | ORAL | Status: DC

## 2012-05-26 MED ORDER — IRBESARTAN 150 MG PO TABS: 150.0000 mg | ORAL_TABLET | Freq: Every day | ORAL | Status: DC

## 2012-05-26 MED ORDER — TRAMADOL HCL 50 MG PO TABS: 100.0000 mg | ORAL_TABLET | Freq: Four times a day (QID) | ORAL | Status: DC | PRN

## 2012-05-26 MED ORDER — FUROSEMIDE 80 MG PO TABS: 80.0000 mg | ORAL_TABLET | Freq: Every morning | ORAL | Status: DC

## 2012-05-26 NOTE — Progress Notes (Signed)
Subjective:     Patient ID: [name], female   DOB: 1932-11-09, 79 y.o.   MRN: [id number]  HPI Kayline is here today to discuss several issues including her right shoulder pain,decreasing her medications and medication refills.    1)  Right Shoulder Pain:  She says that she has been struggling with pain in her right shoulder for several weeks.  It has been severe enough that she ended up in the ED and was seen at [facility] where she had an x-ray and received an injection.  She says that the orthopedist told her that if the injection did not work for her,  she probably needs to have surgery on this shoulder.  She has a follow up with [facility].  She has been taking Norco which was given to her by Dr. Laneta Simmers.  She says that it works VERY well for her pain and she wants to have it refilled.    2)  Medications:  She is on many medications and wants to know if she can come off of some of them. Many of them are very expensive.    Review of Systems  Constitutional: Negative for activity change and unexpected weight change.  Respiratory: Positive for cough.   Cardiovascular: Positive for leg swelling (Mild). Negative for chest pain and palpitations.  Musculoskeletal: Positive for myalgias and arthralgias (Right Shoulder Pain is her biggest complaint today.  ).  Neurological: Negative for dizziness.       She has been having trouble sleeping.     Past Medical History  Diagnosis Date  . HTN (hypertension)   . Hernia     Incisional hernia, umbilicus.  . H/O: hysterectomy   . CHB (complete heart block)     s/p pacer (Tenant => GT)  . Atrial fibrillation     on xarleto  . Depression   . Goiter   . DJD (degenerative joint disease)   . GERD (gastroesophageal reflux disease)   . Diverticulosis   . Cardiomyopathy secondary     Myoview 3/13: small inf scar apex to base with mild peri-infarct isch; EF 32%;   LHC 05/12/11: pLAD 20%, pCFX 20%, mid 30%, m+dRCA with mild luminal irregs; EF 35%  with mid inf wal and inf-apical HK(no culprit for segmental WMA)  . CAD (coronary artery disease)     nonobs by [facility] 4/13  . Systolic CHF   . Pacemaker     upgrade to BiV PPM 02/19/12 (LV lead off)  . H/O hiatal hernia   . LBBB (left bundle branch block)    Family History  Problem Relation Age of Onset  . Adopted: Yes  . Coronary artery disease    . Other      stomach carcinoma  . Stomach cancer Other   . Coronary artery disease Other   . Cancer Mother     History   Social History  . Marital Status: Married    Spouse Name: [name]    Number of Children: 3  . Years of Education: 14   Occupational History  .     Social History Main Topics  . Smoking status: Former Smoker    Types: Cigarettes    Quit date: 01/21/2009  . Smokeless tobacco: Former Neurosurgeon    Quit date: 01/21/2009  . Alcohol Use: No  . Drug Use: No  . Sexually Active: No   Other Topics Concern  . Not on file   Social History Narrative   Marital Status:  Married [name])   Children:  3 (Daughter 1955,  Son 1956, Son 1964)   Living Situation: Lives with spouse in a one-level home with 3 steps to enter.  They moved to [facility] from [place] since [name] is from this area.     Occupation: Retired Proofreader)   Education: Associates Degree   Tobacco Use/Exposure: 3 packs per week for > 50 years; She quit smoking in 2010     Alcohol Use: Occasional   Drug Use:  None   Diet:  Healthy   Exercise:  None   Hobbies:  Crossword Puzzles, Knitting, Reading, Sewing                         Objective:   Physical Exam  Constitutional: She is oriented to person, place, and time. Vital signs are normal. She appears well-nourished. No distress.  HENT:  Head: Normocephalic.  Eyes: Conjunctivae are normal. No scleral icterus.  Neck: Neck supple. No thyromegaly present.  Cardiovascular: Normal rate, regular rhythm and normal heart sounds.   Pulmonary/Chest: Effort normal and breath sounds normal.  Musculoskeletal: She  exhibits edema and tenderness.       Right shoulder: She exhibits decreased range of motion and tenderness. She exhibits no swelling.  Lymphadenopathy:    She has no cervical adenopathy.  Neurological: She is alert and oriented to person, place, and time. Coordination normal.  Skin: Skin is warm and dry.  Psychiatric: She has a normal mood and affect. Her speech is normal and behavior is normal. Judgment and thought content normal. Cognition and memory are normal.       Assessment:     Right Shoulder Pain COPD Hypertension  Insomnia  Leg Swelling  Hyperlipidemia     Plan:     1)  Pallas was pretty insistent that I give her a refill for her Norco and I declined.  I think the medication was first given to her by Dr. Laneta Simmers after she had a thoracotomy and she wants me to continue it. She says that she wants it for her right shoulder pain.  She has had this shoulder evaluated by an orthopedic surgeon and has a follow up with them. I told her that if they felt she needed to be on narcotic pain medication then they can put her on it.  She is on a high dosage of Tramadol (2 pills 4 times per day). She wants to continue on this plus get the Norco.  She was given a refill for Tramadol and Flexeril to hopefully help her sleep at night.  I told her that if she needs to be referred to a pain management office then we could do that for her.   2)  She has a cough which keeps her up at night.  We have documented an ACE allergy before.  She was put on lisinopril in the hospital and the cough has returned and is keeping her up at night.   She used to be on Exforge which did not give her a cough.   We'll try her on some Avapro since we don't want her to have any extra edema in her legs.  She will take this in addition to her Coreg she is on.    3)  Refilled her Lipitor.    4)  Her breathing is better on the combination of Advair and Combivent.  These are both very expensive medications. She was given some  samples of Advair.  She was given a prescription for Spriva to replace the Combivent if it is more affordable.    5)  Refilled her Lasix.    6)  She was reminded that she has been low in Vitamin D. She was encouraged to get back on this to help her pain.    7)  She was encouraged to limit her caffeine intake and to try some OTC aids for sleep (Melatonin,   Sleepy Time Herbal Tea or Sweet Dreams and soak in a hot bath with Epsom salt).     8)  She is to return for labwork next week.    TIME 45 MINUTES:  MORE THAN 50% OF TIME WAS INVOLVED IN COUNSELING

## 2012-05-26 NOTE — Patient Instructions (Addendum)
1)  Blood Pressure- We stopped the lisinopril cause it gives you a cough.  I have sent in a medication called Avapro that you can take once a day along with your Coreg.  2)  Vitamin D - Take D3 5,000 IU daily.    3)  Sleep - Melatonin 5 mg at night plus add some Sleepy Time Herbal Tea or Sweet Dreams; Soak in a hot bath with Epsom salt to relax your muscles.    4)  Pain - Try taking a Flexeril 5 mg at night.    Shoulder Exercises EXERCISES  RANGE OF MOTION (ROM) AND STRETCHING EXERCISES These exercises may help you when beginning to rehabilitate your injury. Your symptoms may resolve with or without further involvement from your physician, physical therapist or athletic trainer. While completing these exercises, remember:   Restoring tissue flexibility helps normal motion to return to the joints. This allows healthier, less painful movement and activity.  An effective stretch should be held for at least 30 seconds.  A stretch should never be painful. You should only feel a gentle lengthening or release in the stretched tissue. ROM - Pendulum  Bend at the waist so that your right / left arm falls away from your body. Support yourself with your opposite hand on a solid surface, such as a table or a countertop.  Your right / left arm should be perpendicular to the ground. If it is not perpendicular, you need to lean over farther. Relax the muscles in your right / left arm and shoulder as much as possible.  Gently sway your hips and trunk so they move your right / left arm without any use of your right / left shoulder muscles.  Progress your movements so that your right / left arm moves side to side, then forward and backward, and finally, both clockwise and counterclockwise.  Complete __________ repetitions in each direction. Many people use this exercise to relieve discomfort in their shoulder as well as to gain range of motion. Repeat __________ times. Complete this exercise __________  times per day. STRETCH  Flexion, Standing  Stand with good posture. With an underhand grip on your right / left hand and an overhand grip on the opposite hand, grasp a broomstick or cane so that your hands are a little more than shoulder-width apart.  Keeping your right / left elbow straight and shoulder muscles relaxed, push the stick with your opposite hand to raise your right / left arm in front of your body and then overhead. Raise your arm until you feel a stretch in your right / left shoulder, but before you have increased shoulder pain.  Try to avoid shrugging your right / left shoulder as your arm rises by keeping your shoulder blade tucked down and toward your mid-back spine. Hold __________ seconds.  Slowly return to the starting position. Repeat __________ times. Complete this exercise __________ times per day. STRETCH - Internal Rotation  Place your right / left hand behind your back, palm-up.  Throw a towel or belt over your opposite shoulder. Grasp the towel/belt with your right / left hand.  While keeping an upright posture, gently pull up on the towel/belt until you feel a stretch in the front of your right / left shoulder.  Avoid shrugging your right / left shoulder as your arm rises by keeping your shoulder blade tucked down and toward your mid-back spine.  Hold __________. Release the stretch by lowering your opposite hand. Repeat __________ times. Complete this  exercise __________ times per day. STRETCH - External Rotation and Abduction  Stagger your stance through a doorframe. It does not matter which foot is forward.  As instructed by your physician, physical therapist or athletic trainer, place your hands:  And forearms above your head and on the door frame.  And forearms at head-height and on the door frame.  At elbow-height and on the door frame.  Keeping your head and chest upright and your stomach muscles tight to prevent over-extending your low-back,  slowly shift your weight onto your front foot until you feel a stretch across your chest and/or in the front of your shoulders.  Hold __________ seconds. Shift your weight to your back foot to release the stretch. Repeat __________ times. Complete this stretch __________ times per day.  STRENGTHENING EXERCISES  These exercises may help you when beginning to rehabilitate your injury. They may resolve your symptoms with or without further involvement from your physician, physical therapist or athletic trainer. While completing these exercises, remember:   Muscles can gain both the endurance and the strength needed for everyday activities through controlled exercises.  Complete these exercises as instructed by your physician, physical therapist or athletic trainer. Progress the resistance and repetitions only as guided.  You may experience muscle soreness or fatigue, but the pain or discomfort you are trying to eliminate should never worsen during these exercises. If this pain does worsen, stop and make certain you are following the directions exactly. If the pain is still present after adjustments, discontinue the exercise until you can discuss the trouble with your clinician.  If advised by your physician, during your recovery, avoid activity or exercises which involve actions that place your right / left hand or elbow above your head or behind your back or head. These positions stress the tissues which are trying to heal. STRENGTH - Scapular Depression and Adduction  With good posture, sit on a firm chair. Supported your arms in front of you with pillows, arm rests or a table top. Have your elbows in line with the sides of your body.  Gently draw your shoulder blades down and toward your mid-back spine. Gradually increase the tension without tensing the muscles along the top of your shoulders and the back of your neck.  Hold for __________ seconds. Slowly release the tension and relax your  muscles completely before completing the next repetition.  After you have practiced this exercise, remove the arm support and complete it in standing as well as sitting. Repeat __________ times. Complete this exercise __________ times per day.  STRENGTH - External Rotators  Secure a rubber exercise band/tubing to a fixed object so that it is at the same height as your right / left elbow when you are standing or sitting on a firm surface.  Stand or sit so that the secured exercise band/tubing is at your side that is not injured.  Bend your elbow 90 degrees. Place a folded towel or small pillow under your right / left arm so that your elbow is a few inches away from your side.  Keeping the tension on the exercise band/tubing, pull it away from your body, as if pivoting on your elbow. Be sure to keep your body steady so that the movement is only coming from your shoulder rotating.  Hold __________ seconds. Release the tension in a controlled manner as you return to the starting position. Repeat __________ times. Complete this exercise __________ times per day.  STRENGTH - Supraspinatus  Stand or  sit with good posture. Grasp a __________ weight or an exercise band/tubing so that your hand is "thumbs-up," like when you shake hands.  Slowly lift your right / left hand from your thigh into the air, traveling about 30 degrees from straight out at your side. Lift your hand to shoulder height or as far as you can without increasing any shoulder pain. Initially, many people do not lift their hands above shoulder height.  Avoid shrugging your right / left shoulder as your arm rises by keeping your shoulder blade tucked down and toward your mid-back spine.  Hold for __________ seconds. Control the descent of your hand as you slowly return to your starting position. Repeat __________ times. Complete this exercise __________ times per day.  STRENGTH - Shoulder Extensors  Secure a rubber exercise  band/tubing so that it is at the height of your shoulders when you are either standing or sitting on a firm arm-less chair.  With a thumbs-up grip, grasp an end of the band/tubing in each hand. Straighten your elbows and lift your hands straight in front of you at shoulder height. Step back away from the secured end of band/tubing until it becomes tense.  Squeezing your shoulder blades together, pull your hands down to the sides of your thighs. Do not allow your hands to go behind you.  Hold for __________ seconds. Slowly ease the tension on the band/tubing as you reverse the directions and return to the starting position. Repeat __________ times. Complete this exercise __________ times per day.  STRENGTH - Scapular Retractors  Secure a rubber exercise band/tubing so that it is at the height of your shoulders when you are either standing or sitting on a firm arm-less chair.  With a palm-down grip, grasp an end of the band/tubing in each hand. Straighten your elbows and lift your hands straight in front of you at shoulder height. Step back away from the secured end of band/tubing until it becomes tense.  Squeezing your shoulder blades together, draw your elbows back as you bend them. Keep your upper arm lifted away from your body throughout the exercise.  Hold __________ seconds. Slowly ease the tension on the band/tubing as you reverse the directions and return to the starting position. Repeat __________ times. Complete this exercise __________ times per day. STRENGTH  Scapular Depressors  Find a sturdy chair without wheels, such as a from a dining room table.  Keeping your feet on the floor, lift your bottom from the seat and lock your elbows.  Keeping your elbows straight, allow gravity to pull your body weight down. Your shoulders will rise toward your ears.  Raise your body against gravity by drawing your shoulder blades down your back, shortening the distance between your shoulders and  ears. Although your feet should always maintain contact with the floor, your feet should progressively support less body weight as you get stronger.  Hold __________ seconds. In a controlled and slow manner, lower your body weight to begin the next repetition. Repeat __________ times. Complete this exercise __________ times per day.  Document Released: 12/10/2004 Document Revised: 04/20/2011 Document Reviewed: 05/10/2008 [facility] Patient Information 2013 [place], [place].

## 2012-05-29 ENCOUNTER — Encounter: Payer: Self-pay | Admitting: Family Medicine

## 2012-05-29 DIAGNOSIS — M25519 Pain in unspecified shoulder: Secondary | ICD-10-CM | POA: Insufficient documentation

## 2012-05-29 DIAGNOSIS — G4700 Insomnia, unspecified: Secondary | ICD-10-CM | POA: Insufficient documentation

## 2012-05-29 DIAGNOSIS — R5381 Other malaise: Secondary | ICD-10-CM | POA: Insufficient documentation

## 2012-05-29 DIAGNOSIS — E785 Hyperlipidemia, unspecified: Secondary | ICD-10-CM | POA: Insufficient documentation

## 2012-05-29 DIAGNOSIS — E559 Vitamin D deficiency, unspecified: Secondary | ICD-10-CM | POA: Insufficient documentation

## 2012-06-01 ENCOUNTER — Other Ambulatory Visit: Payer: Self-pay | Admitting: Family Medicine

## 2012-06-01 LAB — COMPLETE METABOLIC PANEL WITH GFR
ALT: 8 U/L (ref 0–35)
AST: 13 U/L (ref 0–37)
Albumin: 4 g/dL (ref 3.5–5.2)
Alkaline Phosphatase: 80 U/L (ref 39–117)
BUN: 19 mg/dL (ref 6–23)
CO2: 30 mEq/L (ref 19–32)
Calcium: 9.2 mg/dL (ref 8.4–10.5)
Chloride: 102 mEq/L (ref 96–112)
Creat: 1.03 mg/dL (ref 0.50–1.10)
GFR, Est African American: 60 mL/min
GFR, Est Non African American: 52 mL/min — ABNORMAL LOW
Glucose, Bld: 90 mg/dL (ref 70–99)
Potassium: 3.7 mEq/L (ref 3.5–5.3)
Sodium: 141 mEq/L (ref 135–145)
Total Bilirubin: 0.9 mg/dL (ref 0.3–1.2)
Total Protein: 5.9 g/dL — ABNORMAL LOW (ref 6.0–8.3)

## 2012-06-01 LAB — CBC WITH DIFFERENTIAL/PLATELET
Basophils Absolute: 0 10*3/uL (ref 0.0–0.1)
Basophils Relative: 1 % (ref 0–1)
Eosinophils Absolute: 0.2 10*3/uL (ref 0.0–0.7)
Eosinophils Relative: 4 % (ref 0–5)
HCT: 34.4 % — ABNORMAL LOW (ref 36.0–46.0)
Hemoglobin: 10.5 g/dL — ABNORMAL LOW (ref 12.0–15.0)
Lymphocytes Relative: 21 % (ref 12–46)
Lymphs Abs: 1.1 10*3/uL (ref 0.7–4.0)
MCH: 24.7 pg — ABNORMAL LOW (ref 26.0–34.0)
MCHC: 30.5 g/dL (ref 30.0–36.0)
MCV: 80.9 fL (ref 78.0–100.0)
Monocytes Absolute: 0.5 10*3/uL (ref 0.1–1.0)
Monocytes Relative: 9 % (ref 3–12)
Neutro Abs: 3.4 10*3/uL (ref 1.7–7.7)
Neutrophils Relative %: 65 % (ref 43–77)
Platelets: 268 10*3/uL (ref 150–400)
RBC: 4.25 MIL/uL (ref 3.87–5.11)
RDW: 16.9 % — ABNORMAL HIGH (ref 11.5–15.5)
WBC: 5.2 10*3/uL (ref 4.0–10.5)

## 2012-06-01 LAB — TSH: TSH: 0.374 u[IU]/mL (ref 0.350–4.500)

## 2012-06-01 LAB — LIPID PANEL
Cholesterol: 135 mg/dL (ref 0–200)
HDL: 51 mg/dL (ref >39)
LDL Cholesterol: 76 mg/dL (ref 0–99)
Total CHOL/HDL Ratio: 2.6 Ratio
Triglycerides: 42 mg/dL (ref <150)
VLDL: 8 mg/dL (ref 0–40)

## 2012-06-01 LAB — T4, FREE: Free T4: 1.49 ng/dL (ref 0.80–1.80)

## 2012-06-04 ENCOUNTER — Telehealth: Payer: Self-pay | Admitting: Physician Assistant

## 2012-06-04 NOTE — Telephone Encounter (Signed)
Pt called the answering svc c/o chest "shocking" and discomfort. She has been in her USOH until the last 1-2 days when she has noticed an abnormal sensation in her chest and arms described as shocking occurring at random. She note mildly increased SOB with this. She denies LE edema, orthopnea, PND or weight increase. No aggravation with exertion. She does have a [employer] Bi-V PPM implanted in 02/2012 c/b LV lead dislodgement leading to diaphragmatic stimulation. She underwent epicardial lead placement by [facility] in 03/2012. She had been feel well until today. She denies fevers or chills. No complaints concerning her prior incision sites or pocket. Discussed with Dr. Ladona Ridgel, will plan to have patient seen in the pacemaker clinic on Monday 06/06/12. Informed the patient of the plan and provided reassurance. Advised if she developed fevers, chills, pocket changes/swelling/tenderness/discharge to present to the ED. She understood and agreed.    Jacqulyn Bath, PA-C 06/04/2012 2:11 PM

## 2012-06-06 ENCOUNTER — Other Ambulatory Visit: Payer: Self-pay | Admitting: Internal Medicine

## 2012-06-06 ENCOUNTER — Inpatient Hospital Stay (HOSPITAL_COMMUNITY)
Admission: EM | Admit: 2012-06-06 | Discharge: 2012-06-09 | DRG: 378 | Disposition: A | Discharge status: Home / Self Care | Payer: Medicare Other | Attending: Internal Medicine | Admitting: Internal Medicine

## 2012-06-06 ENCOUNTER — Ambulatory Visit (HOSPITAL_COMMUNITY)
Admission: RE | Admit: 2012-06-06 | Discharge: 2012-06-06 | Disposition: A | Discharge status: Home / Self Care | Payer: Medicare Other | Source: Ambulatory Visit | Attending: Internal Medicine | Admitting: Internal Medicine

## 2012-06-06 ENCOUNTER — Encounter (HOSPITAL_COMMUNITY): Payer: Self-pay | Admitting: Emergency Medicine

## 2012-06-06 ENCOUNTER — Ambulatory Visit (INDEPENDENT_AMBULATORY_CARE_PROVIDER_SITE_OTHER): Payer: Medicare Other | Admitting: *Deleted

## 2012-06-06 ENCOUNTER — Emergency Department (HOSPITAL_COMMUNITY): Payer: Medicare Other

## 2012-06-06 ENCOUNTER — Encounter: Payer: Self-pay | Admitting: Internal Medicine

## 2012-06-06 ENCOUNTER — Other Ambulatory Visit: Payer: Self-pay

## 2012-06-06 DIAGNOSIS — I509 Heart failure, unspecified: Secondary | ICD-10-CM | POA: Diagnosis present

## 2012-06-06 DIAGNOSIS — R5383 Other fatigue: Secondary | ICD-10-CM

## 2012-06-06 DIAGNOSIS — Z87891 Personal history of nicotine dependence: Secondary | ICD-10-CM

## 2012-06-06 DIAGNOSIS — D649 Anemia, unspecified: Secondary | ICD-10-CM

## 2012-06-06 DIAGNOSIS — Z950 Presence of cardiac pacemaker: Secondary | ICD-10-CM

## 2012-06-06 DIAGNOSIS — W19XXXA Unspecified fall, initial encounter: Secondary | ICD-10-CM

## 2012-06-06 DIAGNOSIS — R600 Localized edema: Secondary | ICD-10-CM

## 2012-06-06 DIAGNOSIS — G4700 Insomnia, unspecified: Secondary | ICD-10-CM

## 2012-06-06 DIAGNOSIS — I4891 Unspecified atrial fibrillation: Secondary | ICD-10-CM

## 2012-06-06 DIAGNOSIS — S8001XA Contusion of right knee, initial encounter: Secondary | ICD-10-CM

## 2012-06-06 DIAGNOSIS — I447 Left bundle-branch block, unspecified: Secondary | ICD-10-CM | POA: Diagnosis present

## 2012-06-06 DIAGNOSIS — K921 Melena: Secondary | ICD-10-CM

## 2012-06-06 DIAGNOSIS — T82110A Breakdown (mechanical) of cardiac electrode, initial encounter: Secondary | ICD-10-CM

## 2012-06-06 DIAGNOSIS — D689 Coagulation defect, unspecified: Secondary | ICD-10-CM

## 2012-06-06 DIAGNOSIS — S0093XA Contusion of unspecified part of head, initial encounter: Secondary | ICD-10-CM

## 2012-06-06 DIAGNOSIS — F3289 Other specified depressive episodes: Secondary | ICD-10-CM | POA: Diagnosis present

## 2012-06-06 DIAGNOSIS — I079 Rheumatic tricuspid valve disease, unspecified: Secondary | ICD-10-CM | POA: Diagnosis present

## 2012-06-06 DIAGNOSIS — I2789 Other specified pulmonary heart diseases: Secondary | ICD-10-CM | POA: Diagnosis present

## 2012-06-06 DIAGNOSIS — I495 Sick sinus syndrome: Secondary | ICD-10-CM

## 2012-06-06 DIAGNOSIS — D509 Iron deficiency anemia, unspecified: Secondary | ICD-10-CM | POA: Diagnosis present

## 2012-06-06 DIAGNOSIS — E785 Hyperlipidemia, unspecified: Secondary | ICD-10-CM

## 2012-06-06 DIAGNOSIS — E049 Nontoxic goiter, unspecified: Secondary | ICD-10-CM | POA: Diagnosis present

## 2012-06-06 DIAGNOSIS — Z7901 Long term (current) use of anticoagulants: Secondary | ICD-10-CM

## 2012-06-06 DIAGNOSIS — E876 Hypokalemia: Secondary | ICD-10-CM

## 2012-06-06 DIAGNOSIS — I429 Cardiomyopathy, unspecified: Secondary | ICD-10-CM

## 2012-06-06 DIAGNOSIS — K5730 Diverticulosis of large intestine without perforation or abscess without bleeding: Secondary | ICD-10-CM | POA: Diagnosis present

## 2012-06-06 DIAGNOSIS — F329 Major depressive disorder, single episode, unspecified: Secondary | ICD-10-CM | POA: Diagnosis present

## 2012-06-06 DIAGNOSIS — T82190A Other mechanical complication of cardiac electrode, initial encounter: Secondary | ICD-10-CM

## 2012-06-06 DIAGNOSIS — R55 Syncope and collapse: Secondary | ICD-10-CM | POA: Diagnosis present

## 2012-06-06 DIAGNOSIS — I10 Essential (primary) hypertension: Secondary | ICD-10-CM | POA: Diagnosis present

## 2012-06-06 DIAGNOSIS — I2510 Atherosclerotic heart disease of native coronary artery without angina pectoris: Secondary | ICD-10-CM | POA: Diagnosis present

## 2012-06-06 DIAGNOSIS — K31819 Angiodysplasia of stomach and duodenum without bleeding: Secondary | ICD-10-CM | POA: Diagnosis present

## 2012-06-06 DIAGNOSIS — R0789 Other chest pain: Secondary | ICD-10-CM

## 2012-06-06 DIAGNOSIS — I5022 Chronic systolic (congestive) heart failure: Secondary | ICD-10-CM | POA: Diagnosis present

## 2012-06-06 DIAGNOSIS — Z7982 Long term (current) use of aspirin: Secondary | ICD-10-CM

## 2012-06-06 DIAGNOSIS — E559 Vitamin D deficiency, unspecified: Secondary | ICD-10-CM

## 2012-06-06 DIAGNOSIS — D500 Iron deficiency anemia secondary to blood loss (chronic): Secondary | ICD-10-CM | POA: Diagnosis present

## 2012-06-06 DIAGNOSIS — Z79899 Other long term (current) drug therapy: Secondary | ICD-10-CM

## 2012-06-06 DIAGNOSIS — K219 Gastro-esophageal reflux disease without esophagitis: Secondary | ICD-10-CM | POA: Diagnosis present

## 2012-06-06 DIAGNOSIS — I2589 Other forms of chronic ischemic heart disease: Secondary | ICD-10-CM | POA: Diagnosis present

## 2012-06-06 DIAGNOSIS — M1990 Unspecified osteoarthritis, unspecified site: Secondary | ICD-10-CM | POA: Diagnosis present

## 2012-06-06 DIAGNOSIS — I428 Other cardiomyopathies: Secondary | ICD-10-CM

## 2012-06-06 DIAGNOSIS — R011 Cardiac murmur, unspecified: Secondary | ICD-10-CM | POA: Diagnosis present

## 2012-06-06 DIAGNOSIS — I5023 Acute on chronic systolic (congestive) heart failure: Secondary | ICD-10-CM

## 2012-06-06 DIAGNOSIS — K31811 Angiodysplasia of stomach and duodenum with bleeding: Principal | ICD-10-CM | POA: Diagnosis present

## 2012-06-06 HISTORY — DX: Angiodysplasia of stomach and duodenum without bleeding: K31.819

## 2012-06-06 HISTORY — DX: Iron deficiency anemia, unspecified: D50.9

## 2012-06-06 LAB — CBC WITH DIFFERENTIAL/PLATELET
Basophils Relative: 1 % (ref 0–1)
Eosinophils Absolute: 0.2 10*3/uL (ref 0.0–0.7)
Lymphs Abs: 1.1 10*3/uL (ref 0.7–4.0)
MCH: 24.2 pg — ABNORMAL LOW (ref 26.0–34.0)
Neutro Abs: 3.5 10*3/uL (ref 1.7–7.7)
Neutrophils Relative %: 66 % (ref 43–77)
Platelets: 190 10*3/uL (ref 150–400)
RBC: 3.64 MIL/uL — ABNORMAL LOW (ref 3.87–5.11)
WBC: 5.3 10*3/uL (ref 4.0–10.5)

## 2012-06-06 LAB — PACEMAKER DEVICE OBSERVATION
BATTERY VOLTAGE: 2.9478 V
RV LEAD IMPEDENCE PM: 437.5 Ohm
RV LEAD THRESHOLD: 1.25 V
VENTRICULAR PACING PM: 98

## 2012-06-06 LAB — COMPREHENSIVE METABOLIC PANEL
ALT: 9 U/L (ref 0–35)
Albumin: 3.2 g/dL — ABNORMAL LOW (ref 3.5–5.2)
Alkaline Phosphatase: 74 U/L (ref 39–117)
Chloride: 102 mEq/L (ref 96–112)
Glucose, Bld: 94 mg/dL (ref 70–99)
Potassium: 3.8 mEq/L (ref 3.5–5.1)
Sodium: 138 mEq/L (ref 135–145)
Total Protein: 5.6 g/dL — ABNORMAL LOW (ref 6.0–8.3)

## 2012-06-06 LAB — URINE MICROSCOPIC-ADD ON

## 2012-06-06 LAB — URINALYSIS, ROUTINE W REFLEX MICROSCOPIC
Bilirubin Urine: NEGATIVE
Glucose, UA: NEGATIVE mg/dL
Ketones, ur: NEGATIVE mg/dL
pH: 6.5 (ref 5.0–8.0)

## 2012-06-06 LAB — POCT I-STAT TROPONIN I

## 2012-06-06 LAB — OCCULT BLOOD, POC DEVICE: Fecal Occult Bld: POSITIVE — AB

## 2012-06-06 MED ORDER — HEPARIN SODIUM (PORCINE) 5000 UNIT/ML IJ SOLN
5000.0000 [IU] | Freq: Three times a day (TID) | INTRAMUSCULAR | Status: DC
  Administered 2012-06-07 – 2012-06-09 (×8): 5000 [IU] via SUBCUTANEOUS
  Filled 2012-06-06 (×10): qty 1

## 2012-06-06 NOTE — ED Notes (Signed)
MD at bedside. 

## 2012-06-06 NOTE — ED Provider Notes (Signed)
History     CSN: [id number]  Arrival date & time 06/06/12  1539   First MD Initiated Contact with Patient 06/06/12 1549      Chief Complaint  Patient presents with  . Loss of Consciousness    (Consider location/radiation/quality/duration/timing/severity/associated sxs/prior treatment) HPI  Patient reports she went to Dr. Lubertha Basque office today and she was sent to the hospital to get a chest x-ray to look at her pacemaker wires. She reports she had her first pacemaker inserted in 2003. She reports in January 2014 she had it replaced. She reports her pacemaker has been beating too fast and they are checking into her wires. She states she did not feel like her heart was beating too fast today. Evidently when she was in the radiology department she was walking and she acutely passed out. She states she had no warning. The x-ray staff report she was unconscious for 10 seconds. She was not confused when she came around. She did fall forward and fell onto her knees and hit her forehead on the floor. She denies chest pain, shortness of breath, headache. She states she has some soreness of her forehead. Just has some discomfort in her right knee which she has had a knee replacement in before. She denies feeling weak or dizzy. She states she did eat breakfast today but had not had lunch.  PCP Dr Alberteen Sam Cardiologist Dr. Ladona Ridgel     Past Medical History  Diagnosis Date  . HTN (hypertension)   . Hernia     Incisional hernia, umbilicus.  . H/O: hysterectomy   . CHB (complete heart block)     s/p pacer (Tenant => GT)  . Atrial fibrillation     on xarleto  . Depression   . Goiter   . DJD (degenerative joint disease)   . GERD (gastroesophageal reflux disease)   . Diverticulosis   . Cardiomyopathy secondary     Myoview 3/13: small inf scar apex to base with mild peri-infarct isch; EF 32%;   LHC 05/12/11: pLAD 20%, pCFX 20%, mid 30%, m+dRCA with mild luminal irregs; EF 35% with mid inf wal and  inf-apical HK(no culprit for segmental WMA)  . CAD (coronary artery disease)     nonobs by [facility] 4/13  . Systolic CHF   . Pacemaker     upgrade to BiV PPM 02/19/12 (LV lead off)  . H/O hiatal hernia   . LBBB (left bundle branch block)     Past Surgical History  Procedure Laterality Date  . Pacemaker placement  , 2002.  [name] Cholecystectomy    . Insert / replace / remove pacemaker    . Abdominal hysterectomy    . Tubal ligation    . Small intestine surgery    . Cardiac catheterization    . Epicardial pacing lead placement Left 03/17/2012    Procedure: EPICARDIAL PACING LEAD PLACEMENT;  Surgeon: [name], MD;  Location: [facility] OR;  Service: Thoracic;  Laterality: Left;  placement of LV epicardial leads    Family History  Problem Relation Age of Onset  . Adopted: Yes  . Coronary artery disease    . Other      stomach carcinoma  . Stomach cancer Other   . Coronary artery disease Other   . Cancer Mother     History  Substance Use Topics  . Smoking status: Former Smoker    Types: Cigarettes    Quit date: 01/21/2009  . Smokeless tobacco: Former Neurosurgeon  Quit date: 01/21/2009  . Alcohol Use: No   lives at home Lives with husband  OB History   Grav Para Term Preterm Abortions TAB SAB Ect Mult Living                  Review of Systems  All other systems reviewed and are negative.    Allergies  Angiotensin receptor blockers  Home Medications   Current Outpatient Rx  Name  Route  Sig  Dispense  Refill  . ADVAIR DISKUS 250-50 MCG/DOSE AEPB   Inhalation   Inhale 1 puff into the lungs 2 (two) times daily.          [name] aspirin EC 81 MG tablet   Oral   Take 81 mg by mouth every morning.          [name] atorvastatin (LIPITOR) 40 MG tablet   Oral   Take 20 mg by mouth every morning. Take 1/2 - 1 tab daily         . carvedilol (COREG) 25 MG tablet   Oral   Take 25 mg by mouth 2 (two) times daily with a meal.           . COMBIVENT RESPIMAT 20-100 MCG/ACT AERS  respimat   Inhalation   Inhale 1 puff into the lungs every 6 (six) hours as needed for wheezing or shortness of breath.          . cyclobenzaprine (FLEXERIL) 5 MG tablet   Oral   Take 5 mg by mouth at bedtime. Take 1 tab po q hs         . DULoxetine (CYMBALTA) 30 MG capsule   Oral   Take 30 mg by mouth every morning.          . ferrous sulfate 325 (65 FE) MG tablet   Oral   Take 1 tablet (325 mg total) by mouth 3 (three) times daily with meals.   30 tablet   0   . furosemide (LASIX) 80 MG tablet   Oral   Take 1 tablet (80 mg total) by mouth every morning.   30 tablet   9   . irbesartan (AVAPRO) 150 MG tablet   Oral   Take 150 mg by mouth every morning.         . lansoprazole (PREVACID) 15 MG capsule   Oral   Take 15 mg by mouth every morning.          . potassium chloride (K-DUR) 10 MEQ tablet   Oral   Take 1 tablet (10 mEq total) by mouth 2 (two) times daily.   30 tablet   0   . Rivaroxaban (XARELTO) 20 MG TABS   Oral   Take 20 mg by mouth daily with supper.          . tiotropium (SPIRIVA HANDIHALER) 18 MCG inhalation capsule   Inhalation   Place 1 capsule (18 mcg total) into inhaler and inhale daily.   30 capsule   11     BP 139/105  Pulse 70  Resp 14  SpO2 96%  Orthostatic vital signs show lying to standing her pulse remained 70, her blood pressure went from 123 systolic sitting to 109 systolic standing.  Vital signs normal    Physical Exam  Nursing note and vitals reviewed. Constitutional: She is oriented to person, place, and time. She appears well-developed and well-nourished.  Non-toxic appearance. She does not appear ill. No distress.  HENT:  Head:  Normocephalic.  Right Ear: External ear normal.  Left Ear: External ear normal.  Nose: Nose normal. No mucosal edema or rhinorrhea.  Mouth/Throat: Oropharynx is clear and moist and mucous membranes are normal. No dental abscesses or edematous.  Has mild swelling and tenderness to the  right forehead  Eyes: Conjunctivae and EOM are normal. Pupils are equal, round, and reactive to light.  Neck: Normal range of motion and full passive range of motion without pain. Neck supple.  Cardiovascular: Normal rate, regular rhythm and normal heart sounds.  Exam reveals no gallop and no friction rub.   No murmur heard. Pulmonary/Chest: Effort normal and breath sounds normal. No respiratory distress. She has no wheezes. She has no rhonchi. She has no rales. She exhibits no tenderness and no crepitus.  Abdominal: Soft. Normal appearance and bowel sounds are normal. She exhibits no distension. There is no tenderness. There is no rebound and no guarding.  Genitourinary:  No external hemorrhoids, no stool in the vault, there were small specks of red on my glove  Musculoskeletal: Normal range of motion. She exhibits no edema and no tenderness.  Moves all extremities well. Has some tenderness to her anterior right knee. There's no abrasions or bruising seen, there's no joint effusion noted. She is noted to have a small bruise about the size of a nickel on her proximal left lower leg that appears to be mildly tender. Her knee on the left is nontender.  Neurological: She is alert and oriented to person, place, and time. She has normal strength. No cranial nerve deficit.  Skin: Skin is warm, dry and intact. No rash noted. No erythema. No pallor.  Psychiatric: She has a normal mood and affect. Her speech is normal and behavior is normal. Her mood appears not anxious.    ED Course  Procedures (including critical care time)  After reviewing her laboratory results patient was reinterviewed. She states her stools are black however she takes iron supplementation. She denies seeing blood in her stool.  20:33 Dr Conley Rolls will admit for syncope evaluation. I will have nurses interogate her pacemaker which is a Paediatric nurse.    Results for orders placed during the hospital encounter of 06/06/12  CBC WITH  DIFFERENTIAL      Result Value Range   WBC 5.3  4.0 - 10.5 K/uL   RBC 3.64 (*) 3.87 - 5.11 MIL/uL   Hemoglobin 8.8 (*) 12.0 - 15.0 g/dL   HCT 96.0 (*) 45.4 - 09.8 %   MCV 80.2  78.0 - 100.0 fL   MCH 24.2 (*) 26.0 - 34.0 pg   MCHC 30.1  30.0 - 36.0 g/dL   RDW 11.9 (*) 14.7 - 82.9 %   Platelets 190  150 - 400 K/uL   Neutrophils Relative 66  43 - 77 %   Neutro Abs 3.5  1.7 - 7.7 K/uL   Lymphocytes Relative 21  12 - 46 %   Lymphs Abs 1.1  0.7 - 4.0 K/uL   Monocytes Relative 9  3 - 12 %   Monocytes Absolute 0.5  0.1 - 1.0 K/uL   Eosinophils Relative 4  0 - 5 %   Eosinophils Absolute 0.2  0.0 - 0.7 K/uL   Basophils Relative 1  0 - 1 %   Basophils Absolute 0.0  0.0 - 0.1 K/uL  COMPREHENSIVE METABOLIC PANEL      Result Value Range   Sodium 138  135 - 145 mEq/L   Potassium 3.8  3.5 - 5.1  mEq/L   Chloride 102  96 - 112 mEq/L   CO2 29  19 - 32 mEq/L   Glucose, Bld 94  70 - 99 mg/dL   BUN 19  6 - 23 mg/dL   Creatinine, Ser 4.54  0.50 - 1.10 mg/dL   Calcium 9.2  8.4 - 09.8 mg/dL   Total Protein 5.6 (*) 6.0 - 8.3 g/dL   Albumin 3.2 (*) 3.5 - 5.2 g/dL   AST 14  0 - 37 U/L   ALT 9  0 - 35 U/L   Alkaline Phosphatase 74  39 - 117 U/L   Total Bilirubin 0.5  0.3 - 1.2 mg/dL   GFR calc non Af Amer 53 (*) >90 mL/min   GFR calc Af Amer 62 (*) >90 mL/min  URINALYSIS, ROUTINE W REFLEX MICROSCOPIC      Result Value Range   Color, Urine YELLOW  YELLOW   APPearance CLEAR  CLEAR   Specific Gravity, Urine 1.018  1.005 - 1.030   pH 6.5  5.0 - 8.0   Glucose, UA NEGATIVE  NEGATIVE mg/dL   Hgb urine dipstick NEGATIVE  NEGATIVE   Bilirubin Urine NEGATIVE  NEGATIVE   Ketones, ur NEGATIVE  NEGATIVE mg/dL   Protein, ur NEGATIVE  NEGATIVE mg/dL   Urobilinogen, UA 1.0  0.0 - 1.0 mg/dL   Nitrite NEGATIVE  NEGATIVE   Leukocytes, UA SMALL (*) NEGATIVE  GLUCOSE, CAPILLARY      Result Value Range   Glucose-Capillary 88  70 - 99 mg/dL  URINE MICROSCOPIC-ADD ON      Result Value Range   Squamous  Epithelial / LPF RARE  RARE   WBC, UA 0-2  <3 WBC/hpf   RBC / HPF 0-2  <3 RBC/hpf   Bacteria, UA RARE  RARE  POCT I-STAT TROPONIN I      Result Value Range   Troponin i, poc 0.02  0.00 - 0.08 ng/mL   Comment 3           OCCULT BLOOD, POC DEVICE      Result Value Range   Fecal Occult Bld POSITIVE (*) NEGATIVE    Laboratory interpretation all normal except worsening from her baseline anemia, Hemoccult positive   Dg Chest 2 View  06/06/2012  *RADIOLOGY REPORT*  Clinical Data: Syncope, fall.  Knee pain.  CHEST - 2 VIEW  Comparison: 05/04/2012  Findings: Left pacer remains in place, unchanged.  Cardiomegaly. No overt edema.  Left base atelectasis or infiltrate, slightly improved.  Small left pleural effusion also slightly improved.  No focal opacity on the right.  IMPRESSION: Improving but persistent small left pleural effusion and left base atelectasis.   Original Report Authenticated By: Charlett Nose, M.D.    Ct Head Wo Contrast  06/06/2012  *RADIOLOGY REPORT*  Clinical Data: Syncopal episode.  Head trauma.  CT HEAD WITHOUT CONTRAST  Technique:  Contiguous axial images were obtained from the base of the skull through the vertex without contrast.  Comparison: None.  Findings: The brain shows mild age related atrophy.  There is no evidence of old or acute focal infarction, mass lesion, hemorrhage, hydrocephalus or extra-axial collection.  No skull fracture.  No fluid in the sinuses, middle ears or mastoids.  IMPRESSION: No acute or traumatic finding.  Mild age related atrophy.   Original Report Authenticated By: Paulina Fusi, M.D.    Dg Knee Complete 4 Views Right  06/06/2012  *RADIOLOGY REPORT*  Clinical Data: Syncope, fall, knee pain.  RIGHT KNEE -  COMPLETE 4+ VIEW  Comparison: 06/30/2006  Findings: Changes of right knee replacement.  Old healed proximal right femoral fracture.  No hardware complicating feature.  No acute fracture, subluxation or dislocation.  No joint effusion.  IMPRESSION: No  acute bony abnormality.   Original Report Authenticated By: Charlett Nose, M.D.      Date: 06/06/2012  Rate: 70  Rhythm: ventricular paced rhythm   Old EKG Reviewed: unchanged from 03/12/2012      1. Syncope   2. Anemia   3. Blood in stool   4. Fall, initial encounter   5. Contusion of head, initial encounter   6. Contusion, knee, right, initial encounter   7. Coagulopathy     Plan admission  Devoria Albe, MD, FACEP  MDM          Ward Givens, MD 06/06/12 [phone]

## 2012-06-06 NOTE — ED Notes (Addendum)
Pt hit head with fall but denies any pain or discomfort at the moment. Denies any dizziness, lightheadedness before fall or at the moment. Pt a&o.

## 2012-06-06 NOTE — ED Notes (Signed)
Attempted to call report, nurse unavailable.

## 2012-06-06 NOTE — ED Notes (Signed)
Pacemaker interrogation completed. Will monitor for results

## 2012-06-06 NOTE — ED Notes (Signed)
[facility] cancelled per Dr Conley Rolls. Patient to stay at [facility].

## 2012-06-06 NOTE — ED Notes (Signed)
[facility] called for patient transfer 

## 2012-06-06 NOTE — ED Notes (Signed)
Pt was seen for outpatient chest xray for pacemaker lead malfunction from Dr Lewayne Bunting. and had a syncopal episode for about 10 secs. Pt fell forward onto knees hitting right side of head. Pt has pacemaker with new generator.

## 2012-06-06 NOTE — H&P (Signed)
[facility] History and Physical  [name] ZOX:[id number] DOB: 1932/03/19    PCP:   [name], MD   Chief Complaint: syncope.  HPI: [name] is an 79 y.o. female with hx of afib on Xarelto, HTN, S/P ppm for complete HB, s/p epicardial lead placement 2/14, chronic systolic HF, nonobtructive CAD, sent in by PCP for a CXR as she was having palpitation, suffered LOC at the hospital.  She thought she was feeling lightheaded, but no palpitation prior to syncope.  No HA, chest pain, nauseaor vomiting.  She denied any fever or chills.  No seizure activities.  She admitted to having black stool being on iron supplement.  In Dec 13, she was admitted for DOE, and was found to have anemia requiring PRBC, but her guaic was negative, so no GI was consulted.  She had some bleeding hemorrhoids, and denied any significant melena or BRBPR.  She denied any abdominal pain.  Evaluation in the ER included a Hb of 8.8g/DL, BUN 19, and normal renal fx tests.  Her CXR was negative except for a persistent small pleural effusion.  Her head CT and right knee films were negative. Hospitalist was asked to admit her for syncope work up and GI bleed.  Rewiew of Systems:  Constitutional: Negative for malaise, fever and chills. No significant weight loss or weight gain Eyes: Negative for eye pain, redness and discharge, diplopia, visual changes, or flashes of light. ENMT: Negative for ear pain, hoarseness, nasal congestion, sinus pressure and sore throat. No headaches; tinnitus, drooling, or problem swallowing. Cardiovascular: Negative for chest pain,  diaphoresis, dyspnea and peripheral edema. ; No orthopnea, PND Respiratory: Negative for cough, hemoptysis, wheezing and stridor. No pleuritic chestpain. Gastrointestinal: Negative for nausea, vomiting, diarrhea, constipation, abdominal pain, melena, blood in stool, hematemesis, jaundice and rectal bleeding.    Genitourinary: Negative for frequency, dysuria,  incontinence,flank pain and hematuria; Musculoskeletal: Negative for back pain and neck pain. Negative for swelling and trauma.;  Skin: . Negative for pruritus, rash, abrasions, bruising and skin lesion.; ulcerations Neuro: Negative for headache, and neck stiffness. Negative for weakness , altered mental status, extremity weakness, burning feet, involuntary movement, seizure and syncope.  Psych: negative for anxiety, depression, insomnia, tearfulness, panic attacks, hallucinations, paranoia, suicidal or homicidal ideation    Past Medical History  Diagnosis Date  . HTN (hypertension)   . Hernia     Incisional hernia, umbilicus.  . H/O: hysterectomy   . CHB (complete heart block)     s/p pacer ([facility]enant => GT)  . Atrial fibrillation     on xarleto  . Depression   . Goiter   . DJD (degenerative joint disease)   . GERD (gastroesophageal reflux disease)   . Diverticulosis   . Cardiomyopathy secondary     Myoview 3/13: small inf scar apex to base with mild peri-infarct isch; EF 32%;   LHC 05/12/11: pLAD 20%, pCFX 20%, mid 30%, m+dRCA with mild luminal irregs; EF 35% with mid inf wal and inf-apical HK(no culprit for segmental WMA)  . CAD (coronary artery disease)     nonobs by [facility] 4/13  . Systolic CHF   . Pacemaker     upgrade to BiV PPM 02/19/12 (LV lead off)  . H/O hiatal hernia   . LBBB (left bundle branch block)     Past Surgical History  Procedure Laterality Date  . Pacemaker placement  , 2002.  [name] Cholecystectomy    . Insert / replace / remove pacemaker    .  Abdominal hysterectomy    . Tubal ligation    . Small intestine surgery    . Cardiac catheterization    . Epicardial pacing lead placement Left 03/17/2012    Procedure: EPICARDIAL PACING LEAD PLACEMENT;  Surgeon: [name], MD;  Location: [facility] OR;  Service: Thoracic;  Laterality: Left;  placement of LV epicardial leads    Medications:  HOME MEDS: Prior to Admission medications   Medication Sig Start Date End Date  Taking? Authorizing Provider  ADVAIR DISKUS 250-50 MCG/DOSE AEPB Inhale 1 puff into the lungs 2 (two) times daily.  04/02/12  Yes Historical Provider, MD  aspirin EC 81 MG tablet Take 81 mg by mouth every morning.  05/25/11  Yes Scott Moishe Spice, PA-C  atorvastatin (LIPITOR) 40 MG tablet Take 20 mg by mouth every morning. Take 1/2 - 1 tab daily 05/26/12 05/26/13 Yes Gillian Scarce, MD  carvedilol (COREG) 25 MG tablet Take 25 mg by mouth 2 (two) times daily with a meal.     Yes Historical Provider, MD  COMBIVENT RESPIMAT 20-100 MCG/ACT AERS respimat Inhale 1 puff into the lungs every 6 (six) hours as needed for wheezing or shortness of breath.  04/02/12  Yes Historical Provider, MD  cyclobenzaprine (FLEXERIL) 5 MG tablet Take 5 mg by mouth at bedtime. Take 1 tab po q hs 05/26/12 05/26/13 Yes Gillian Scarce, MD  DULoxetine (CYMBALTA) 30 MG capsule Take 30 mg by mouth every morning.  04/14/12  Yes Historical Provider, MD  ferrous sulfate 325 (65 FE) MG tablet Take 1 tablet (325 mg total) by mouth 3 (three) times daily with meals. 01/25/12  Yes Belkys A Regalado, MD  furosemide (LASIX) 80 MG tablet Take 1 tablet (80 mg total) by mouth every morning. 05/26/12  Yes Gillian Scarce, MD  irbesartan (AVAPRO) 150 MG tablet Take 150 mg by mouth every morning. 05/26/12 05/26/13 Yes Gillian Scarce, MD  lansoprazole (PREVACID) 15 MG capsule Take 15 mg by mouth every morning.    Yes Historical Provider, MD  potassium chloride (K-DUR) 10 MEQ tablet Take 1 tablet (10 mEq total) by mouth 2 (two) times daily. 01/25/12  Yes Belkys A Regalado, MD  Rivaroxaban (XARELTO) 20 MG TABS Take 20 mg by mouth daily with supper.    Yes Historical Provider, MD  tiotropium (SPIRIVA HANDIHALER) 18 MCG inhalation capsule Place 1 capsule (18 mcg total) into inhaler and inhale daily. 05/26/12 05/26/13 Yes Gillian Scarce, MD     Allergies:  Allergies  Allergen Reactions  . Angiotensin Receptor Blockers     Social History:   reports that she quit  smoking about 3 years ago. Her smoking use included Cigarettes. She smoked 0.00 packs per day. She quit smokeless tobacco use about 3 years ago. She reports that she does not drink alcohol or use illicit drugs.  Family History: Family History  Problem Relation Age of Onset  . Adopted: Yes  . Coronary artery disease    . Other      stomach carcinoma  . Stomach cancer Other   . Coronary artery disease Other   . Cancer Mother      Physical Exam: Filed Vitals:   06/06/12 1658 06/06/12 1940 06/06/12 1945 06/06/12 2000  BP: 119/54 109/71 132/50 139/105  Pulse: 98 70    Resp:  18 27 14   SpO2:  96%     Blood pressure 139/105, pulse 70, resp. rate 14, SpO2 96.00%.  GEN:  Pleasant  patient lying in  the stretcher in no acute distress; cooperative with exam. PSYCH:  alert and oriented x4; does not appear anxious or depressed; affect is appropriate. HEENT: Mucous membranes pink and anicteric; PERRLA; EOM intact; no cervical lymphadenopathy nor thyromegaly or carotid bruit; no JVD; There were no stridor. Neck is very supple. Breasts:: Not examined CHEST WALL: No tenderness.  Left upper chest PPM. CHEST: Normal respiration, clear to auscultation bilaterally.  HEART: Regular rate and rhythm.  There are no murmur, rub, or gallops.   BACK: No kyphosis or scoliosis; no CVA tenderness ABDOMEN: soft and non-tender; no masses, no organomegaly, normal abdominal bowel sounds; no pannus; no intertriginous candida. There is no rebound and no distention. Rectal Exam: Not done.  Per EDP, guaic positive dark stool. EXTREMITIES: No bone or joint deformity; age-appropriate arthropathy of the hands and knees; no edema; no ulcerations.  There is no calf tenderness. Genitalia: not examined PULSES: 2+ and symmetric SKIN: Normal hydration no rash or ulceration CNS: Cranial nerves 2-12 grossly intact no focal lateralizing neurologic deficit.  Speech is fluent; uvula elevated with phonation, facial symmetry and  tongue midline. DTR are normal bilaterally, cerebella exam is intact, barbinski is negative and strengths are equaled bilaterally.  No sensory loss.   Labs on Admission:  Basic Metabolic Panel:  Recent Labs Lab 06/01/12 1401 06/06/12 1613  NA 141 138  K 3.7 3.8  CL 102 102  CO2 30 29  GLUCOSE 90 94  BUN 19 19  CREATININE 1.03 0.99  CALCIUM 9.2 9.2   Liver Function Tests:  Recent Labs Lab 06/01/12 1401 06/06/12 1613  AST 13 14  ALT 8 9  ALKPHOS 80 74  BILITOT 0.9 0.5  PROT 5.9* 5.6*  ALBUMIN 4.0 3.2*   No results found for this basename: LIPASE, AMYLASE,  in the last 168 hours No results found for this basename: AMMONIA,  in the last 168 hours CBC:  Recent Labs Lab 06/01/12 1401 06/06/12 1613  WBC 5.2 5.3  NEUTROABS 3.4 3.5  HGB 10.5* 8.8*  HCT 34.4* 29.2*  MCV 80.9 80.2  PLT 268 190   Cardiac Enzymes: No results found for this basename: CKTOTAL, CKMB, CKMBINDEX, TROPONINI,  in the last 168 hours  CBG:  Recent Labs Lab 06/06/12 1612  GLUCAP 88     Radiological Exams on Admission: Dg Chest 2 View  06/06/2012  *RADIOLOGY REPORT*  Clinical Data: Syncope, fall.  Knee pain.  CHEST - 2 VIEW  Comparison: 05/04/2012  Findings: Left pacer remains in place, unchanged.  Cardiomegaly. No overt edema.  Left base atelectasis or infiltrate, slightly improved.  Small left pleural effusion also slightly improved.  No focal opacity on the right.  IMPRESSION: Improving but persistent small left pleural effusion and left base atelectasis.   Original Report Authenticated By: Charlett Nose, M.D.    Ct Head Wo Contrast  06/06/2012  *RADIOLOGY REPORT*  Clinical Data: Syncopal episode.  Head trauma.  CT HEAD WITHOUT CONTRAST  Technique:  Contiguous axial images were obtained from the base of the skull through the vertex without contrast.  Comparison: None.  Findings: The brain shows mild age related atrophy.  There is no evidence of old or acute focal infarction, mass lesion,  hemorrhage, hydrocephalus or extra-axial collection.  No skull fracture.  No fluid in the sinuses, middle ears or mastoids.  IMPRESSION: No acute or traumatic finding.  Mild age related atrophy.   Original Report Authenticated By: Paulina Fusi, M.D.    Dg Knee Complete 4 Views Right  06/06/2012  *RADIOLOGY REPORT*  Clinical Data: Syncope, fall, knee pain.  RIGHT KNEE - COMPLETE 4+ VIEW  Comparison: 06/30/2006  Findings: Changes of right knee replacement.  Old healed proximal right femoral fracture.  No hardware complicating feature.  No acute fracture, subluxation or dislocation.  No joint effusion.  IMPRESSION: No acute bony abnormality.   Original Report Authenticated By: Charlett Nose, M.D.    Paced rhythm.  Assessment/Plan Present on Admission:  . Syncope and collapse . Anemia . Atrial fibrillation . Chronic systolic heart failure . Hypertension . Nonischemic cardiomyopathy . PPM-[facility]  PLAN:  I don't think her GI bleed is the cause of her syncope.  She appears to have a slow bleed, and it s likely lower GI bleed.  Will admit her to telemetry as she is hemodynamically stable.  For her syncope, will interrogate her PPM, obtain an ECHO and r/out for ACS.  For her anemia, will type and screen and follow h/h q 6 hours.  Please consult GI in the morning as she likely will need endoscopy.  I will have her NPO for now and hold her iron supplement.  For her afib, she is rate controlled and will hold her Xarelto.  Please resume it if endoscopy is not going to be done.  Her other medical problems are stable, and I will continue her meds.  She is stable, full code, and will be admitted to [facility] service.  Thank you for allowing me to partake in the care of this nice patient.  Other plans as per orders.  Code Status: FULL Unk Lightning, MD. [facility] Pager [phone] 7pm to 7am.  06/06/2012, 10:08 PM

## 2012-06-06 NOTE — Progress Notes (Signed)
PPM check 

## 2012-06-06 NOTE — ED Notes (Signed)
Patient transported to CT 

## 2012-06-07 ENCOUNTER — Encounter (HOSPITAL_COMMUNITY): Payer: Self-pay | Admitting: Internal Medicine

## 2012-06-07 DIAGNOSIS — I2510 Atherosclerotic heart disease of native coronary artery without angina pectoris: Secondary | ICD-10-CM

## 2012-06-07 DIAGNOSIS — R55 Syncope and collapse: Secondary | ICD-10-CM

## 2012-06-07 DIAGNOSIS — D509 Iron deficiency anemia, unspecified: Secondary | ICD-10-CM

## 2012-06-07 DIAGNOSIS — I369 Nonrheumatic tricuspid valve disorder, unspecified: Secondary | ICD-10-CM

## 2012-06-07 HISTORY — DX: Iron deficiency anemia, unspecified: D50.9

## 2012-06-07 LAB — CBC
HCT: 29.9 % — ABNORMAL LOW (ref 36.0–46.0)
HCT: 30.1 % — ABNORMAL LOW (ref 36.0–46.0)
Hemoglobin: 9.3 g/dL — ABNORMAL LOW (ref 12.0–15.0)
MCH: 25.1 pg — ABNORMAL LOW (ref 26.0–34.0)
MCH: 25.1 pg — ABNORMAL LOW (ref 26.0–34.0)
MCH: 25.3 pg — ABNORMAL LOW (ref 26.0–34.0)
MCHC: 30.9 g/dL (ref 30.0–36.0)
MCHC: 31.2 g/dL (ref 30.0–36.0)
MCV: 80.8 fL (ref 78.0–100.0)
MCV: 81.1 fL (ref 78.0–100.0)
MCV: 80.9 fL (ref 78.0–100.0)
Platelets: 203 10*3/uL (ref 150–400)
Platelets: 194 10*3/uL (ref 150–400)
RBC: 3.7 MIL/uL — ABNORMAL LOW (ref 3.87–5.11)
RDW: 18.8 % — ABNORMAL HIGH (ref 11.5–15.5)
RDW: 18.8 % — ABNORMAL HIGH (ref 11.5–15.5)

## 2012-06-07 LAB — TROPONIN I: Troponin I: <0.3 ng/mL (ref <0.30)

## 2012-06-07 LAB — TYPE AND SCREEN: ABO/RH(D): O POS

## 2012-06-07 MED ORDER — CARVEDILOL 25 MG PO TABS
25.0000 mg | ORAL_TABLET | Freq: Two times a day (BID) | ORAL | Status: DC
  Administered 2012-06-07 – 2012-06-09 (×5): 25 mg via ORAL
  Filled 2012-06-07 (×7): qty 1

## 2012-06-07 MED ORDER — IPRATROPIUM-ALBUTEROL 20-100 MCG/ACT IN AERS: 1.0000 | INHALATION_SPRAY | Freq: Four times a day (QID) | RESPIRATORY_TRACT | Status: DC | PRN

## 2012-06-07 MED ORDER — METOCLOPRAMIDE HCL 5 MG/ML IJ SOLN
10.0000 mg | Freq: Once | INTRAMUSCULAR | Status: AC
  Administered 2012-06-09: 10 mg via INTRAVENOUS
  Filled 2012-06-07 (×2): qty 2

## 2012-06-07 MED ORDER — MOMETASONE FURO-FORMOTEROL FUM 100-5 MCG/ACT IN AERO
2.0000 | INHALATION_SPRAY | Freq: Two times a day (BID) | RESPIRATORY_TRACT | Status: DC
  Filled 2012-06-07: qty 8.8

## 2012-06-07 MED ORDER — BISACODYL 5 MG PO TBEC: 10.0000 mg | DELAYED_RELEASE_TABLET | Freq: Once | ORAL | Status: DC

## 2012-06-07 MED ORDER — PEG-KCL-NACL-NASULF-NA ASC-C 100 G PO SOLR: 1.0000 | Freq: Once | ORAL | Status: DC

## 2012-06-07 MED ORDER — METOCLOPRAMIDE HCL 5 MG/ML IJ SOLN
10.0000 mg | Freq: Once | INTRAMUSCULAR | Status: AC
  Administered 2012-06-08: 10 mg via INTRAVENOUS
  Filled 2012-06-07: qty 2

## 2012-06-07 MED ORDER — POTASSIUM CHLORIDE ER 10 MEQ PO TBCR
10.0000 meq | EXTENDED_RELEASE_TABLET | Freq: Two times a day (BID) | ORAL | Status: DC
  Administered 2012-06-07 – 2012-06-09 (×5): 10 meq via ORAL
  Filled 2012-06-07 (×6): qty 1

## 2012-06-07 MED ORDER — CYCLOBENZAPRINE HCL 5 MG PO TABS
5.0000 mg | ORAL_TABLET | Freq: Every day | ORAL | Status: DC
  Administered 2012-06-07 – 2012-06-08 (×3): 5 mg via ORAL
  Filled 2012-06-07 (×4): qty 1

## 2012-06-07 MED ORDER — TIOTROPIUM BROMIDE MONOHYDRATE 18 MCG IN CAPS
18.0000 ug | ORAL_CAPSULE | Freq: Every day | RESPIRATORY_TRACT | Status: DC
  Administered 2012-06-07 – 2012-06-09 (×3): 18 ug via RESPIRATORY_TRACT
  Filled 2012-06-07: qty 5

## 2012-06-07 MED ORDER — DULOXETINE HCL 30 MG PO CPEP
30.0000 mg | ORAL_CAPSULE | Freq: Every morning | ORAL | Status: DC
  Administered 2012-06-07 – 2012-06-09 (×3): 30 mg via ORAL
  Filled 2012-06-07 (×3): qty 1

## 2012-06-07 MED ORDER — PEG-KCL-NACL-NASULF-NA ASC-C 100 G PO SOLR
0.5000 | Freq: Once | ORAL | Status: DC
  Filled 2012-06-07: qty 1

## 2012-06-07 MED ORDER — IRBESARTAN 150 MG PO TABS
150.0000 mg | ORAL_TABLET | Freq: Every morning | ORAL | Status: DC
  Administered 2012-06-07 – 2012-06-09 (×3): 150 mg via ORAL
  Filled 2012-06-07 (×3): qty 1

## 2012-06-07 MED ORDER — SODIUM CHLORIDE 0.9 % IJ SOLN
3.0000 mL | Freq: Two times a day (BID) | INTRAMUSCULAR | Status: DC
  Administered 2012-06-07 – 2012-06-08 (×5): 3 mL via INTRAVENOUS

## 2012-06-07 MED ORDER — ASPIRIN EC 81 MG PO TBEC
81.0000 mg | DELAYED_RELEASE_TABLET | Freq: Every morning | ORAL | Status: DC
  Administered 2012-06-07: 81 mg via ORAL
  Filled 2012-06-07: qty 1

## 2012-06-07 MED ORDER — FUROSEMIDE 80 MG PO TABS
80.0000 mg | ORAL_TABLET | Freq: Every morning | ORAL | Status: DC
  Administered 2012-06-07: 80 mg via ORAL
  Filled 2012-06-07 (×2): qty 1

## 2012-06-07 MED ORDER — PANTOPRAZOLE SODIUM 20 MG PO TBEC
20.0000 mg | DELAYED_RELEASE_TABLET | Freq: Every day | ORAL | Status: DC
  Administered 2012-06-07 – 2012-06-09 (×3): 20 mg via ORAL
  Filled 2012-06-07 (×3): qty 1

## 2012-06-07 MED ORDER — PEG-KCL-NACL-NASULF-NA ASC-C 100 G PO SOLR
0.5000 | Freq: Once | ORAL | Status: AC
  Administered 2012-06-08: 50 g via ORAL
  Filled 2012-06-07: qty 1

## 2012-06-07 MED ORDER — ACETAMINOPHEN 325 MG PO TABS
650.0000 mg | ORAL_TABLET | Freq: Four times a day (QID) | ORAL | Status: DC | PRN
  Administered 2012-06-07: 650 mg via ORAL
  Filled 2012-06-07: qty 2

## 2012-06-07 MED ORDER — PEG-KCL-NACL-NASULF-NA ASC-C 100 G PO SOLR
0.5000 | Freq: Once | ORAL | Status: AC
  Administered 2012-06-09: 50 g via ORAL
  Filled 2012-06-07: qty 1

## 2012-06-07 MED ORDER — MOMETASONE FURO-FORMOTEROL FUM 100-5 MCG/ACT IN AERO
2.0000 | INHALATION_SPRAY | Freq: Two times a day (BID) | RESPIRATORY_TRACT | Status: DC
  Administered 2012-06-07 – 2012-06-09 (×5): 2 via RESPIRATORY_TRACT
  Filled 2012-06-07: qty 8.8

## 2012-06-07 MED ORDER — ATORVASTATIN CALCIUM 20 MG PO TABS
20.0000 mg | ORAL_TABLET | Freq: Every day | ORAL | Status: DC
  Administered 2012-06-07 – 2012-06-08 (×2): 20 mg via ORAL
  Filled 2012-06-07 (×3): qty 1

## 2012-06-07 MED ORDER — TRAMADOL HCL 50 MG PO TABS
50.0000 mg | ORAL_TABLET | Freq: Four times a day (QID) | ORAL | Status: DC | PRN
  Administered 2012-06-07 – 2012-06-08 (×3): 50 mg via ORAL
  Filled 2012-06-07 (×3): qty 1

## 2012-06-07 NOTE — Progress Notes (Signed)
Patient ID: [name], female   DOB: 05-24-1932, 79 y.o.   MRN: [id number]   CARDIOLOGY CONSULT NOTE  Patient ID: [name] MRN: [id number], DOB/AGE: 1932-04-13   Admit date: 06/06/2012 Date of Consult: 06/07/2012   Primary Physician: [name], MD Primary Cardiologist: Lewayne Bunting, MD  Pt. Profile [name] is a 79 year old African American female admitted with syncope and GI bleed.  She was in radiology and remember standing up to get an x-ray. She felt lightheaded and when out. She really had no symptoms such as palpitations, chest pain prior to the syncope.  She does give a history of orthostatic symptoms at home. She says that since her pacemaker was implanted in a graded in January of 2014 she has noted more dizziness with standing. She has a history of ischemic cardiomyopathy with an ejection fraction of 25-30%. She also has a history of moderate mitral regurgitation, severe left ventricular enlargement, left atrial enlargement, severe tricuspid regurgitation with a systolic pulmonary artery pressure of around 50-55. This was on echocardiogram in September 2013. Echocardiogram done today shows improvement in LV systolic function with an EF of 45%, septal dyssynergy, but a significant increase in her pulmonary artery systolic pressure now in the mid 70s.  At home she has had some intermittent palpitations. She has a history of atrial fib and was on anticoagulation on admission.  She denies any chest pain, pleuritic symptoms, hemoptysis. She denies orthopnea, PND but does have dependent edema which resolves at night. She seems to be compliant with her medications. Her hemoglobin on admission was 8.8. She is scheduled to have endoscopy and colonoscopy tomorrow with Dr. Leone Payor. She has a history of colonic polyps. She denies any true melena.  Problem List  Past Medical History  Diagnosis Date  . HTN (hypertension)   . Hernia     Incisional hernia, umbilicus.  . H/O:  hysterectomy   . CHB (complete heart block)     s/p pacer (Tenant => GT)  . Atrial fibrillation     on xarleto  . Depression   . Goiter   . DJD (degenerative joint disease)   . GERD (gastroesophageal reflux disease)   . Diverticulosis   . Cardiomyopathy secondary     Myoview 3/13: small inf scar apex to base with mild peri-infarct isch; EF 32%;   LHC 05/12/11: pLAD 20%, pCFX 20%, mid 30%, m+dRCA with mild luminal irregs; EF 35% with mid inf wal and inf-apical HK(no culprit for segmental WMA)  . CAD (coronary artery disease)     nonobs by [facility] 4/13  . Systolic CHF   . Pacemaker     upgrade to BiV PPM 02/19/12 (LV lead off)  . H/O hiatal hernia   . LBBB (left bundle branch block)   . Iron deficiency anemia, unspecified 06/07/2012    Past Surgical History  Procedure Laterality Date  . Pacemaker placement  , 2002.  [name] Cholecystectomy    . Insert / replace / remove pacemaker    . Abdominal hysterectomy    . Tubal ligation    . Small intestine surgery    . Cardiac catheterization    . Epicardial pacing lead placement Left 03/17/2012    Procedure: EPICARDIAL PACING LEAD PLACEMENT;  Surgeon: [name], MD;  Location: [facility] OR;  Service: Thoracic;  Laterality: Left;  placement of LV epicardial leads     Allergies  Allergies  Allergen Reactions  . Angiotensin Receptor Blockers     HPI   See above  patient profile per history of present illness  Inpatient Medications  . atorvastatin  20 mg Oral QPC supper  . [START ON 06/08/2012] bisacodyl  10 mg Oral Once  . carvedilol  25 mg Oral BID WC  . cyclobenzaprine  5 mg Oral QHS  . DULoxetine  30 mg Oral q morning - 10a  . furosemide  80 mg Oral q morning - 10a  . heparin subcutaneous  5,000 Units Subcutaneous Q8H  . irbesartan  150 mg Oral q morning - 10a  . [START ON 06/08/2012] metoCLOPramide (REGLAN) injection  10 mg Intravenous Once  . [START ON 06/08/2012] metoCLOPramide (REGLAN) injection  10 mg Intravenous Once  .  mometasone-formoterol  2 puff Inhalation BID  . pantoprazole  20 mg Oral Daily  . [START ON 06/08/2012] peg 3350 powder  0.5 kit Oral Once  . [START ON 06/08/2012] peg 3350 powder  0.5 kit Oral Once  . potassium chloride  10 mEq Oral BID  . sodium chloride  3 mL Intravenous Q12H  . tiotropium  18 mcg Inhalation Daily    Family History Family History  Problem Relation Age of Onset  . Adopted: Yes  . Coronary artery disease    . Other      stomach carcinoma  . Stomach cancer Other   . Coronary artery disease Other   . Cancer Mother      Social History History   Social History  . Marital Status: Married    Spouse Name: [name]    Number of Children: 3  . Years of Education: 14   Occupational History  .     Social History Main Topics  . Smoking status: Former Smoker    Types: Cigarettes    Quit date: 01/21/2009  . Smokeless tobacco: Former Neurosurgeon    Quit date: 01/21/2009  . Alcohol Use: No  . Drug Use: No  . Sexually Active: No   Other Topics Concern  . Not on file   Social History Narrative   Marital Status:  Married [name])   Children:  3 (Daughter 1955,  Son 30, Son 1964)   Living Situation: Lives with spouse in a one-level home with 3 steps to enter.  They moved to [facility] from [place] since [name] is from this area.     Occupation: Retired Proofreader)   Education: Associates Degree   Tobacco Use/Exposure: 3 packs per week for > 50 years; She quit smoking in 2010     Alcohol Use: Occasional   Drug Use:  None   Diet:  Healthy   Exercise:  None   Hobbies:  Crossword Puzzles, Knitting, Reading, Sewing                        Review of Systems  General:  No chills, fever, night sweats or weight changes.  Cardiovascular:  No chest pain, dyspnea on exertion, edema, orthopnea,, paroxysmal nocturnal dyspnea. Dermatological: No rash, lesions/masses Respiratory: No cough, dyspnea Urologic: No hematuria, dysuria Abdominal:   No nausea, vomiting, diarrhea, bright red  blood per rectum, melena, or hematemesis Neurologic:  No visual changes, wkns, changes in mental status. All other systems reviewed and are otherwise negative except as noted above.  Physical Exam  Blood pressure 149/86, pulse 74, temperature 97.1 F (36.2 C), temperature source Oral, resp. rate 20, height 5\' 11"  (1.803 m), weight 185 lb 10 oz (84.2 kg), SpO2 98.00%.  General: Pleasant, NAD, very hyperactive Psych: Normal affect. Neuro: Alert and  oriented X 3. Moves all extremities spontaneously. HEENT: Normal  Neck: Supple without bruits , JVD with prominent V-wave Lungs:  Resp regular and unlabored, CTA. Heart: Regular rate and rhythm, 3/6 systolic murmur at the apex and left sternal border., No S3 gallop, no rub Abdomen: Soft, non-tender, non-distended, BS + x 4.  Extremities: No clubbing, cyanosis or edema. DP/PT/Radials 2+ and equal bilaterally.  Labs   Recent Labs  06/07/12 0130 06/07/12 0703 06/07/12 1227  TROPONINI <0.30 <0.30 <0.30   Lab Results  Component Value Date   WBC 5.2 06/07/2012   HGB 9.4* 06/07/2012   HCT 30.1* 06/07/2012   MCV 80.9 06/07/2012   PLT 194 06/07/2012    Recent Labs Lab 06/06/12 1613  NA 138  K 3.8  CL 102  CO2 29  BUN 19  CREATININE 0.99  CALCIUM 9.2  PROT 5.6*  BILITOT 0.5  ALKPHOS 74  ALT 9  AST 14  GLUCOSE 94   Lab Results  Component Value Date   CHOL 135 06/01/2012   HDL 51 06/01/2012   LDLCALC 76 06/01/2012   TRIG 42 06/01/2012   No results found for this basename: DDIMER    Radiology/Studies  Dg Chest 2 View  06/06/2012  *RADIOLOGY REPORT*  Clinical Data: Syncope, fall.  Knee pain.  CHEST - 2 VIEW  Comparison: 05/04/2012  Findings: Left pacer remains in place, unchanged.  Cardiomegaly. No overt edema.  Left base atelectasis or infiltrate, slightly improved.  Small left pleural effusion also slightly improved.  No focal opacity on the right.  IMPRESSION: Improving but persistent small left pleural effusion and left base  atelectasis.   Original Report Authenticated By: Charlett Nose, M.D.    Ct Head Wo Contrast  06/06/2012  *RADIOLOGY REPORT*  Clinical Data: Syncopal episode.  Head trauma.  CT HEAD WITHOUT CONTRAST  Technique:  Contiguous axial images were obtained from the base of the skull through the vertex without contrast.  Comparison: None.  Findings: The brain shows mild age related atrophy.  There is no evidence of old or acute focal infarction, mass lesion, hemorrhage, hydrocephalus or extra-axial collection.  No skull fracture.  No fluid in the sinuses, middle ears or mastoids.  IMPRESSION: No acute or traumatic finding.  Mild age related atrophy.   Original Report Authenticated By: Paulina Fusi, M.D.    Dg Knee Complete 4 Views Right  06/06/2012  *RADIOLOGY REPORT*  Clinical Data: Syncope, fall, knee pain.  RIGHT KNEE - COMPLETE 4+ VIEW  Comparison: 06/30/2006  Findings: Changes of right knee replacement.  Old healed proximal right femoral fracture.  No hardware complicating feature.  No acute fracture, subluxation or dislocation.  No joint effusion.  IMPRESSION: No acute bony abnormality.   Original Report Authenticated By: Charlett Nose, M.D.     ECG  A. fib with ventricular pacing  ASSESSMENT AND PLAN #1 Syncope. This most likely is orthostatic secondary to severe pulmonary hypertension and TR not to mention long-standing systolic congestive heart failure. Having a hemoglobin of 8.8 certainly exacerbates her symptoms. Her pulmonary pressures increase significantly since her previous echocardiogram last year.  #2 chronic A. fib, previously on anticoagulation therapy.  #3 ischemic cardiomyopathy    I agree with holding anticoagulation until source of GI bleeding is identified and controlled. We will have [facility] interrogate her pacer to ensure proper functioning and no specific abnormality at the time of her syncope. I have given her orthostatic precautions. She will do better with a hemoglobin above  10.  We will follow with you.   Signed, Valera Castle, MD 06/07/2012, 7:01 PM

## 2012-06-07 NOTE — Consult Note (Signed)
[facility] Consultation  Referring Provider:  [facility] Primary Care Physician:  [name], MD Primary Gastroenterologist:  none       Reason for Consultation :   Anemia           HPI: [name] is a 79 y.o. female with mulitple medical problems on multiple medications including Xarelto. She had a biventricular pacemaker placed in January 2014. The left ventricluar lead was causing diagphragmatic stimulation despite being changed to several locations. In February patient underwent a left ventricular epicardial lead by left thoracotomy approach.   Patient admitted yesterday after syncopal episode. Patient felt like her heart was "running away" prior to syncopal episode. In ED she was found to be anemic.   Review of patient's CBCs over the last several months shows that hgb fell from 14.3 in March 2013 to 8.1 in December 2013 while patient was hospitalized with CHF.   According to discharge summary patient was transfused and plan was for outpatient GI referral.  Her hemoglobin was up to 10.7 in early February.   Patient's hemoglobin has declined about one gram since early February. Patient believes she was started on Xarelto in January.  She denies overt GI bleeding. No abdominal pain other than gas pains. No nausea. No weight loss. Denies history of PUD. She takes a daily PPI for GERD. NSAID use includes a daily baby ASA   Patient gives a history of colon polyps. She has had colonoscopies in [place]. Her last colonoscopy was in [facility] maybe 3-4 year ago and sounds like polyps were found.   Past Medical History  Diagnosis Date  . HTN (hypertension)   . Hernia     Incisional hernia, umbilicus.  . H/O: hysterectomy   . CHB (complete heart block)     s/p pacer (Tenant => GT)  . Atrial fibrillation     on xarleto  . Depression   . Goiter   . DJD (degenerative joint disease)   . GERD (gastroesophageal reflux disease)   . Diverticulosis   . Cardiomyopathy  secondary     Myoview 3/13: small inf scar apex to base with mild peri-infarct isch; EF 32%;   LHC 05/12/11: pLAD 20%, pCFX 20%, mid 30%, m+dRCA with mild luminal irregs; EF 35% with mid inf wal and inf-apical HK(no culprit for segmental WMA)  . CAD (coronary artery disease)     nonobs by [facility] 4/13  . Systolic CHF   . Pacemaker     upgrade to BiV PPM 02/19/12 (LV lead off)  . H/O hiatal hernia   . LBBB (left bundle branch block)     Past Surgical History  Procedure Laterality Date  . Pacemaker placement  , 2002.  [name] Cholecystectomy    . Insert / replace / remove pacemaker    . Abdominal hysterectomy    . Tubal ligation    . Small intestine surgery    . Cardiac catheterization    . Epicardial pacing lead placement Left 03/17/2012    Procedure: EPICARDIAL PACING LEAD PLACEMENT;  Surgeon: [name], MD;  Location: [facility] OR;  Service: Thoracic;  Laterality: Left;  placement of LV epicardial leads    Family History  Problem Relation Age of Onset  . Adopted: Yes  . Coronary artery disease    . Other      stomach carcinoma  . Stomach cancer Other   . Coronary artery disease Other   . Cancer Mother      History  Substance  Use Topics  . Smoking status: Former Smoker    Types: Cigarettes    Quit date: 01/21/2009  . Smokeless tobacco: Former Neurosurgeon    Quit date: 01/21/2009  . Alcohol Use: No    Prior to Admission medications   Medication Sig Start Date End Date Taking? Authorizing Provider  ADVAIR DISKUS 250-50 MCG/DOSE AEPB Inhale 1 puff into the lungs 2 (two) times daily.  04/02/12  Yes Historical Provider, MD  aspirin EC 81 MG tablet Take 81 mg by mouth every morning.  05/25/11  Yes Scott Moishe Spice, PA-C  atorvastatin (LIPITOR) 40 MG tablet Take 20 mg by mouth every morning. Take 1/2 - 1 tab daily 05/26/12 05/26/13 Yes Gillian Scarce, MD  carvedilol (COREG) 25 MG tablet Take 25 mg by mouth 2 (two) times daily with a meal.     Yes Historical Provider, MD  COMBIVENT RESPIMAT 20-100  MCG/ACT AERS respimat Inhale 1 puff into the lungs every 6 (six) hours as needed for wheezing or shortness of breath.  04/02/12  Yes Historical Provider, MD  cyclobenzaprine (FLEXERIL) 5 MG tablet Take 5 mg by mouth at bedtime. Take 1 tab po q hs 05/26/12 05/26/13 Yes Gillian Scarce, MD  DULoxetine (CYMBALTA) 30 MG capsule Take 30 mg by mouth every morning.  04/14/12  Yes Historical Provider, MD  ferrous sulfate 325 (65 FE) MG tablet Take 1 tablet (325 mg total) by mouth 3 (three) times daily with meals. 01/25/12  Yes Belkys A Regalado, MD  furosemide (LASIX) 80 MG tablet Take 1 tablet (80 mg total) by mouth every morning. 05/26/12  Yes Gillian Scarce, MD  irbesartan (AVAPRO) 150 MG tablet Take 150 mg by mouth every morning. 05/26/12 05/26/13 Yes Gillian Scarce, MD  lansoprazole (PREVACID) 15 MG capsule Take 15 mg by mouth every morning.    Yes Historical Provider, MD  potassium chloride (K-DUR) 10 MEQ tablet Take 1 tablet (10 mEq total) by mouth 2 (two) times daily. 01/25/12  Yes Belkys A Regalado, MD  Rivaroxaban (XARELTO) 20 MG TABS Take 20 mg by mouth daily with supper.    Yes Historical Provider, MD  tiotropium (SPIRIVA HANDIHALER) 18 MCG inhalation capsule Place 1 capsule (18 mcg total) into inhaler and inhale daily. 05/26/12 05/26/13 Yes Gillian Scarce, MD    Current Facility-Administered Medications  Medication Dose Route Frequency Provider Last Rate Last Dose  . acetaminophen (TYLENOL) tablet 650 mg  650 mg Oral Q6H PRN Leda Gauze, NP   650 mg at 06/07/12 0308  . aspirin EC tablet 81 mg  81 mg Oral q morning - 10a Houston Siren, MD   81 mg at 06/07/12 0944  . atorvastatin (LIPITOR) tablet 20 mg  20 mg Oral QPC supper Houston Siren, MD      . carvedilol (COREG) tablet 25 mg  25 mg Oral BID WC Houston Siren, MD   25 mg at 06/07/12 1610  . cyclobenzaprine (FLEXERIL) tablet 5 mg  5 mg Oral QHS Houston Siren, MD   5 mg at 06/07/12 0145  . DULoxetine (CYMBALTA) DR capsule 30 mg  30 mg Oral q morning - 10a Houston Siren, MD   30 mg at 06/07/12 0944  . furosemide (LASIX) tablet 80 mg  80 mg Oral q morning - 10a Houston Siren, MD   80 mg at 06/07/12 0944  . heparin injection 5,000 Units  5,000 Units Subcutaneous Q8H Houston Siren, MD   5,000 Units at 06/07/12 [phone]  . Ipratropium-Albuterol (  COMBIVENT) respimat 1 puff  1 puff Inhalation Q6H PRN Houston Siren, MD      . irbesartan (AVAPRO) tablet 150 mg  150 mg Oral q morning - 10a Houston Siren, MD   150 mg at 06/07/12 0944  . mometasone-formoterol (DULERA) 100-5 MCG/ACT inhaler 2 puff  2 puff Inhalation BID Houston Siren, MD   2 puff at 06/07/12 0825  . pantoprazole (PROTONIX) EC tablet 20 mg  20 mg Oral Daily Houston Siren, MD   20 mg at 06/07/12 0944  . potassium chloride (K-DUR) CR tablet 10 mEq  10 mEq Oral BID Houston Siren, MD   10 mEq at 06/07/12 0944  . sodium chloride 0.9 % injection 3 mL  3 mL Intravenous Q12H Houston Siren, MD   3 mL at 06/07/12 1000  . tiotropium (SPIRIVA) inhalation capsule 18 mcg  18 mcg Inhalation Daily Houston Siren, MD   18 mcg at 06/07/12 0826  . traMADol (ULTRAM) tablet 50 mg  50 mg Oral Q6H PRN Leda Gauze, NP   50 mg at 06/07/12 0308    Allergies as of 06/06/2012 - Review Complete 06/06/2012  Allergen Reaction Noted  . Angiotensin receptor blockers  05/24/2012    Review of Systems:    All systems reviewed and negative except where noted in HPI.  PHYSICAL EXAM: Vital signs in last 24 hours: Temp:  [97.1 F (36.2 C)-97.8 F (36.6 C)] 97.1 F (36.2 C) (04/29 0421) Pulse Rate:  [69-98] 72 (04/29 0421) Resp:  [14-27] 20 (04/29 0421) BP: (109-152)/(50-105) 152/81 mmHg (04/29 0421) SpO2:  [96 %-100 %] 98 % (04/29 0831) Weight:  [185 lb 10 oz (84.2 kg)] 185 lb 10 oz (84.2 kg) (04/29 0045) Last BM Date: 06/06/12 General:   Pleasant black female in NAD Head:  Normocephalic and atraumatic. Eyes:   No icterus.   Conjunctiva pink. Ears:  Normal auditory acuity. Neck:  Supple; no masses felt Lungs:  Respirations even and unlabored. Lungs clear to  auscultation bilaterally.   No wheezes, crackles, or rhonchi.  Heart:  Regular rate and rhythm. Abdomen:  Soft, nondistended, nontender. Normal bowel sounds. No appreciable masses or hepatomegaly.  Rectal:  Scant fleck of very dark, heme negative stool.  Msk:  Symmetrical without gross deformities.  Extremities:  Without edema. Neurologic:  Alert and  oriented x4;  grossly normal neurologically. Skin:  Intact without significant lesions or rashes. Cervical Nodes:  No significant cervical adenopathy. Psych:  Alert and cooperative. Normal affect.  LAB RESULTS:  Recent Labs  06/07/12 0130 06/07/12 0703 06/07/12 1227  WBC 5.9 5.6 5.2  HGB 9.3* 9.3* 9.4*  HCT 29.9* 30.1* 30.1*  PLT 203 227 194   BMET  Recent Labs  06/06/12 1613  NA 138  K 3.8  CL 102  CO2 29  GLUCOSE 94  BUN 19  CREATININE 0.99  CALCIUM 9.2   LFT  Recent Labs  06/06/12 1613  PROT 5.6*  ALBUMIN 3.2*  AST 14  ALT 9  ALKPHOS 74  BILITOT 0.5    STUDIES: Dg Chest 2 View  06/06/2012  *RADIOLOGY REPORT*  Clinical Data: Syncope, fall.  Knee pain.  CHEST - 2 VIEW  Comparison: 05/04/2012  Findings: Left pacer remains in place, unchanged.  Cardiomegaly. No overt edema.  Left base atelectasis or infiltrate, slightly improved.  Small left pleural effusion also slightly improved.  No focal opacity on the right.  IMPRESSION: Improving but persistent small left pleural effusion and left base atelectasis.   Original Report  Authenticated By: Charlett Nose, M.D.    Ct Head Wo Contrast  06/06/2012  *RADIOLOGY REPORT*  Clinical Data: Syncopal episode.  Head trauma.  CT HEAD WITHOUT CONTRAST  Technique:  Contiguous axial images were obtained from the base of the skull through the vertex without contrast.  Comparison: None.  Findings: The brain shows mild age related atrophy.  There is no evidence of old or acute focal infarction, mass lesion, hemorrhage, hydrocephalus or extra-axial collection.  No skull fracture.  No fluid  in the sinuses, middle ears or mastoids.  IMPRESSION: No acute or traumatic finding.  Mild age related atrophy.   Original Report Authenticated By: Paulina Fusi, M.D.    Dg Knee Complete 4 Views Right  06/06/2012  *RADIOLOGY REPORT*  Clinical Data: Syncope, fall, knee pain.  RIGHT KNEE - COMPLETE 4+ VIEW  Comparison: 06/30/2006  Findings: Changes of right knee replacement.  Old healed proximal right femoral fracture.  No hardware complicating feature.  No acute fracture, subluxation or dislocation.  No joint effusion.  IMPRESSION: No acute bony abnormality.   Original Report Authenticated By: Charlett Nose, M.D.      PREVIOUS ENDOSCOPIES:            Colonoscopies in [place] and most recent colonoscopy (3-4 years ago) in [place]  IMPRESSION / PLAN:       1. Pleasant 79 year old female with anemia, borderline microcytic, on Xarelto. Overall, hemoglobin down about on e gram over last two months but overall she is down about 4 grams from baseline. No overt bleeding. Stool dark on iron, heme negative on exam. Patient reports polyps on last colonoscopy 3-4 years ago in [facility]. No GI symptoms to speak of. Will check iron studies keeping in mind that she is on home iron. Patient will likely need endoscopic workup, especially if she will remain on anticoagulants / antiplatelet medications    2.  Multiple medical problems including, but not limited to CHF, afib, symptomatic tachy / brady syndrome. She is s/p biventricular pacemaker placement in January of this year.     Thanks   LOS: 1 day   Willette Cluster  06/07/2012, 1:02 PM      [facility] Attending  I have also seen and assessed the patient and agree with the above note. She has a progressive microcytic anemia that was already proven to be iron-deficiency late 2013   Lab Results  Component Value Date   IRON 20* 01/23/2012   TIBC 408 01/23/2012   FERRITIN 13 01/23/2012     Lab Results  Component Value Date   VITAMINB12 409  01/23/2012    Plan is for colonoscopy, possible EGD on 5/1, off Xarelto. Orders placed. The risks and benefits as well as alternatives of endoscopic procedure(s) have been discussed and reviewed. All questions answered. The patient agrees to proceed.   Iva Boop, MD, [facility] [phone] (pager) 06/07/2012 3:03 PM

## 2012-06-07 NOTE — Progress Notes (Addendum)
[facility] PROGRESS NOTE  [name] ZOX:[id number] DOB: 04/10/1932 DOA: 06/06/2012 PCP: [name], MD  Assessment/Plan: 1. Syncope- Patient had a new biventricular pacemaker inserted on 02/19/2012. Will consult cardiology to check pacemaker and further work up for syncope. Cardiac enzymes x 3 are negative. 2. Lower GI Bleed-Patient was seen by GI, and plan is for colonoscopy on 5/1.        Her Hb has been stable. 3. Coronary artery disease- Continue with coreg, will hold th aspirin at this time. 4. Chronic systolic CHF-  Well compensated, Continue with lasix 80 mg po daily. 5. Atrial fibrillation- xarelto has been held for colonoscopy HR is well controlled.  Code Status: Full code Family Communication: Discussed with patient Disposition Plan: TBD   Consultants:  GI  Procedures:  None  Antibiotics:  None  HPI/Subjective: Patient seen and examined, admitted with syncope. She is on xarelto for a fib. Patient found to be anemic, her hb is 8.8. GI has  Seen the patient and plan for colonoscopy on 5/1.  Objective: Filed Vitals:   06/07/12 0045 06/07/12 0421 06/07/12 0831 06/07/12 1444  BP: 147/79 152/81  149/86  Pulse: 85 72  74  Temp: 97.8 F (36.6 C) 97.1 F (36.2 C)  97.1 F (36.2 C)  TempSrc: Oral Oral  Oral  Resp: 20 20  20   Height: 5\' 11"  (1.803 m)     Weight: 84.2 kg (185 lb 10 oz)     SpO2: 98% 100% 98% 98%    Intake/Output Summary (Last 24 hours) at 06/07/12 1625 Last data filed at 06/07/12 1055  Gross per 24 hour  Intake     60 ml  Output    500 ml  Net   -440 ml   Filed Weights   06/07/12 0045  Weight: 84.2 kg (185 lb 10 oz)    Exam:   General:  Appear in no acute distress   Cardiovascular: S1s2 RRR  Respiratory: Clear bilaterally  Abdomen:  Soft, nontender       Extremities: No edema   Data Reviewed: Basic Metabolic Panel:  Recent Labs Lab 06/01/12 1401 06/06/12 1613  NA 141 138  K 3.7 3.8  CL 102 102  CO2 30 29   GLUCOSE 90 94  BUN 19 19  CREATININE 1.03 0.99  CALCIUM 9.2 9.2   Liver Function Tests:  Recent Labs Lab 06/01/12 1401 06/06/12 1613  AST 13 14  ALT 8 9  ALKPHOS 80 74  BILITOT 0.9 0.5  PROT 5.9* 5.6*  ALBUMIN 4.0 3.2*   No results found for this basename: LIPASE, AMYLASE,  in the last 168 hours No results found for this basename: AMMONIA,  in the last 168 hours CBC:  Recent Labs Lab 06/01/12 1401 06/06/12 1613 06/07/12 0130 06/07/12 0703 06/07/12 1227  WBC 5.2 5.3 5.9 5.6 5.2  NEUTROABS 3.4 3.5  --   --   --   HGB 10.5* 8.8* 9.3* 9.3* 9.4*  HCT 34.4* 29.2* 29.9* 30.1* 30.1*  MCV 80.9 80.2 80.8 81.1 80.9  PLT 268 190 203 227 194   Cardiac Enzymes:  Recent Labs Lab 06/07/12 0130 06/07/12 0703 06/07/12 1227  TROPONINI <0.30 <0.30 <0.30   BNP (last 3 results)  Recent Labs  01/22/12 1245 01/24/12 0550 03/12/12 0945  PROBNP 2781.0* 2122.0* 5680.0*   CBG:  Recent Labs Lab 06/06/12 1612  GLUCAP 88    No results found for this or any previous visit (from the past 240 hour(s)).  Studies: Dg Chest 2 View  06/06/2012  *RADIOLOGY REPORT*  Clinical Data: Syncope, fall.  Knee pain.  CHEST - 2 VIEW  Comparison: 05/04/2012  Findings: Left pacer remains in place, unchanged.  Cardiomegaly. No overt edema.  Left base atelectasis or infiltrate, slightly improved.  Small left pleural effusion also slightly improved.  No focal opacity on the right.  IMPRESSION: Improving but persistent small left pleural effusion and left base atelectasis.   Original Report Authenticated By: Charlett Nose, M.D.    Ct Head Wo Contrast  06/06/2012  *RADIOLOGY REPORT*  Clinical Data: Syncopal episode.  Head trauma.  CT HEAD WITHOUT CONTRAST  Technique:  Contiguous axial images were obtained from the base of the skull through the vertex without contrast.  Comparison: None.  Findings: The brain shows mild age related atrophy.  There is no evidence of old or acute focal infarction, mass  lesion, hemorrhage, hydrocephalus or extra-axial collection.  No skull fracture.  No fluid in the sinuses, middle ears or mastoids.  IMPRESSION: No acute or traumatic finding.  Mild age related atrophy.   Original Report Authenticated By: Paulina Fusi, M.D.    Dg Knee Complete 4 Views Right  06/06/2012  *RADIOLOGY REPORT*  Clinical Data: Syncope, fall, knee pain.  RIGHT KNEE - COMPLETE 4+ VIEW  Comparison: 06/30/2006  Findings: Changes of right knee replacement.  Old healed proximal right femoral fracture.  No hardware complicating feature.  No acute fracture, subluxation or dislocation.  No joint effusion.  IMPRESSION: No acute bony abnormality.   Original Report Authenticated By: Charlett Nose, M.D.     Scheduled Meds: . aspirin EC  81 mg Oral q morning - 10a  . atorvastatin  20 mg Oral QPC supper  . [START ON 06/08/2012] bisacodyl  10 mg Oral Once  . carvedilol  25 mg Oral BID WC  . cyclobenzaprine  5 mg Oral QHS  . DULoxetine  30 mg Oral q morning - 10a  . furosemide  80 mg Oral q morning - 10a  . heparin subcutaneous  5,000 Units Subcutaneous Q8H  . irbesartan  150 mg Oral q morning - 10a  . [START ON 06/08/2012] metoCLOPramide (REGLAN) injection  10 mg Intravenous Once  . [START ON 06/08/2012] metoCLOPramide (REGLAN) injection  10 mg Intravenous Once  . mometasone-formoterol  2 puff Inhalation BID  . pantoprazole  20 mg Oral Daily  . peg 3350 powder  0.5 kit Oral Once   And  . peg 3350 powder  0.5 kit Oral Once  . potassium chloride  10 mEq Oral BID  . sodium chloride  3 mL Intravenous Q12H  . tiotropium  18 mcg Inhalation Daily   Continuous Infusions:   Principal Problem:   Syncope and collapse Active Problems:   PPM-St.[place]ude   Atrial fibrillation   Long term (current) use of anticoagulants   Nonischemic cardiomyopathy   Chronic systolic heart failure   Anemia   Hypertension   Iron deficiency anemia, unspecified    Time spent: 25 min    [facility] Pager [phone]. If 7PM-7AM, please contact night-coverage at [email or web address], password [facility] 06/07/2012, 4:25 PM  LOS: 1 day

## 2012-06-07 NOTE — Care Management Note (Signed)
    Page 1 of 1   06/07/2012     1:58:15 PM   CARE MANAGEMENT NOTE 06/07/2012  Patient:  [name], [name]   Account Number:  [id number]  Date Initiated:  06/07/2012  Documentation initiated by:  Lanier Clam  Subjective/Objective Assessment:   ADMITTED W/SYNCOPE.QM:VHQI,ONG.     Action/Plan:   FROM HOME W/SPOUSE.HAS PCP,PHARMACY.   Anticipated DC Date:  06/10/2012   Anticipated DC Plan:  HOME/SELF CARE      DC Planning Services  CM consult      Choice offered to / List presented to:             Status of service:  In process, will continue to follow Medicare Important Message given?   (If response is "NO", the following Medicare IM given date fields will be blank) Date Medicare IM given:   Date Additional Medicare IM given:    Discharge Disposition:    Per UR Regulation:  Reviewed for med. necessity/level of care/duration of stay  If discussed at Long Length of Stay Meetings, dates discussed:    Comments:  06/07/12 [facility] RN,BSN [phone] [phone]

## 2012-06-07 NOTE — Progress Notes (Signed)
Echocardiogram 2D Echocardiogram has been performed.  [name] 06/07/2012, 3:27 PM

## 2012-06-08 DIAGNOSIS — I10 Essential (primary) hypertension: Secondary | ICD-10-CM

## 2012-06-08 MED ORDER — LORAZEPAM 0.5 MG PO TABS
0.2500 mg | ORAL_TABLET | Freq: Once | ORAL | Status: AC
  Administered 2012-06-08: 0.25 mg via ORAL
  Filled 2012-06-08: qty 1

## 2012-06-08 MED ORDER — FUROSEMIDE 40 MG PO TABS
40.0000 mg | ORAL_TABLET | Freq: Every morning | ORAL | Status: DC
  Administered 2012-06-08 – 2012-06-09 (×2): 40 mg via ORAL
  Filled 2012-06-08 (×2): qty 1

## 2012-06-08 NOTE — Progress Notes (Signed)
[facility] PROGRESS NOTE  [name] ZOX:[id number] DOB: Feb 08, 1933 DOA: 06/06/2012 PCP: [name], MD  Assessment/Plan: 1. Syncope- Patient had a new biventricular pacemaker inserted on 02/19/2012. -pacemaker interrogated per cards and no abnormalities reported. Cardiac enzymes x 3 are negative. Likely secondary to orthostasis-Lasix dose decreased her cardiology, and anemia.  2. Lower GI Bleed- GI following, appreciate input, and plan is for colonoscopy on 5/1.        Her Hb has been stable. 3. Coronary artery disease- Continue with coreg, will holding aspirin at this time secondary to #2. 4. Chronic systolic CHF-  Well compensated,lasix decreased to 40 mg po daily today 4/30 per cardiology see #1. 5. Atrial fibrillation- xarelto has been held for colonoscopy HR is well controlled.  Code Status: Full code Family Communication: Discussed with patient Disposition Plan: TBD   Consultants:  GI  cardiology  Procedures:  None  Antibiotics:  None  HPI/Subjective: Patient denies CP, no SOB and no CP. She denies gross bleeding  Objective: Filed Vitals:   06/07/12 1444 06/07/12 2109 06/08/12 0452 06/08/12 0811  BP: 149/86 141/83 136/99   Pulse: 74 73 70   Temp: 97.1 F (36.2 C) 97.8 F (36.6 C) 98.6 F (37 C)   TempSrc: Oral Axillary Oral   Resp: 20 20 20    Height:      Weight:      SpO2: 98% 100% 100% 100%    Intake/Output Summary (Last 24 hours) at 06/08/12 1332 Last data filed at 06/08/12 1145  Gross per 24 hour  Intake   1080 ml  Output   1750 ml  Net   -670 ml   Filed Weights   06/07/12 0045  Weight: 84.2 kg (185 lb 10 oz)    Exam:   General:  Appear in no acute distress   Cardiovascular: S1s2 RRR  Respiratory: Clear bilaterally  Abdomen:  Soft, nontender       Extremities: No edema   Data Reviewed: Basic Metabolic Panel:  Recent Labs Lab 06/01/12 1401 06/06/12 1613  NA 141 138  K 3.7 3.8  CL 102 102  CO2 30 29  GLUCOSE 90  94  BUN 19 19  CREATININE 1.03 0.99  CALCIUM 9.2 9.2   Liver Function Tests:  Recent Labs Lab 06/01/12 1401 06/06/12 1613  AST 13 14  ALT 8 9  ALKPHOS 80 74  BILITOT 0.9 0.5  PROT 5.9* 5.6*  ALBUMIN 4.0 3.2*   No results found for this basename: LIPASE, AMYLASE,  in the last 168 hours No results found for this basename: AMMONIA,  in the last 168 hours CBC:  Recent Labs Lab 06/01/12 1401 06/06/12 1613 06/07/12 0130 06/07/12 0703 06/07/12 1227  WBC 5.2 5.3 5.9 5.6 5.2  NEUTROABS 3.4 3.5  --   --   --   HGB 10.5* 8.8* 9.3* 9.3* 9.4*  HCT 34.4* 29.2* 29.9* 30.1* 30.1*  MCV 80.9 80.2 80.8 81.1 80.9  PLT 268 190 203 227 194   Cardiac Enzymes:  Recent Labs Lab 06/07/12 0130 06/07/12 0703 06/07/12 1227  TROPONINI <0.30 <0.30 <0.30   BNP (last 3 results)  Recent Labs  01/22/12 1245 01/24/12 0550 03/12/12 0945  PROBNP 2781.0* 2122.0* 5680.0*   CBG:  Recent Labs Lab 06/06/12 1612  GLUCAP 88    No results found for this or any previous visit (from the past 240 hour(s)).   Studies: Dg Chest 2 View  06/06/2012  *RADIOLOGY REPORT*  Clinical Data: Syncope, fall.  Knee pain.  CHEST - 2 VIEW  Comparison: 05/04/2012  Findings: Left pacer remains in place, unchanged.  Cardiomegaly. No overt edema.  Left base atelectasis or infiltrate, slightly improved.  Small left pleural effusion also slightly improved.  No focal opacity on the right.  IMPRESSION: Improving but persistent small left pleural effusion and left base atelectasis.   Original Report Authenticated By: Charlett Nose, M.D.    Ct Head Wo Contrast  06/06/2012  *RADIOLOGY REPORT*  Clinical Data: Syncopal episode.  Head trauma.  CT HEAD WITHOUT CONTRAST  Technique:  Contiguous axial images were obtained from the base of the skull through the vertex without contrast.  Comparison: None.  Findings: The brain shows mild age related atrophy.  There is no evidence of old or acute focal infarction, mass lesion,  hemorrhage, hydrocephalus or extra-axial collection.  No skull fracture.  No fluid in the sinuses, middle ears or mastoids.  IMPRESSION: No acute or traumatic finding.  Mild age related atrophy.   Original Report Authenticated By: Paulina Fusi, M.D.    Dg Knee Complete 4 Views Right  06/06/2012  *RADIOLOGY REPORT*  Clinical Data: Syncope, fall, knee pain.  RIGHT KNEE - COMPLETE 4+ VIEW  Comparison: 06/30/2006  Findings: Changes of right knee replacement.  Old healed proximal right femoral fracture.  No hardware complicating feature.  No acute fracture, subluxation or dislocation.  No joint effusion.  IMPRESSION: No acute bony abnormality.   Original Report Authenticated By: Charlett Nose, M.D.     Scheduled Meds: . atorvastatin  20 mg Oral QPC supper  . bisacodyl  10 mg Oral Once  . carvedilol  25 mg Oral BID WC  . cyclobenzaprine  5 mg Oral QHS  . DULoxetine  30 mg Oral q morning - 10a  . furosemide  40 mg Oral q morning - 10a  . heparin subcutaneous  5,000 Units Subcutaneous Q8H  . irbesartan  150 mg Oral q morning - 10a  . metoCLOPramide (REGLAN) injection  10 mg Intravenous Once  . metoCLOPramide (REGLAN) injection  10 mg Intravenous Once  . mometasone-formoterol  2 puff Inhalation BID  . pantoprazole  20 mg Oral Daily  . peg 3350 powder  0.5 kit Oral Once  . peg 3350 powder  0.5 kit Oral Once  . potassium chloride  10 mEq Oral BID  . sodium chloride  3 mL Intravenous Q12H  . tiotropium  18 mcg Inhalation Daily   Continuous Infusions:   Principal Problem:   Syncope and collapse Active Problems:   PPM-St.Jude   Atrial fibrillation   Long term (current) use of anticoagulants   Nonischemic cardiomyopathy   Chronic systolic heart failure   Anemia   Hypertension   Iron deficiency anemia, unspecified    Time spent: 25 min    Redell Bhandari C  [facility] Pager [phone]. If 7PM-7AM, please contact night-coverage at [email or web address], password [facility] 06/08/2012, 1:32 PM  LOS:  2 days

## 2012-06-08 NOTE — Progress Notes (Signed)
   Subjective:  Denies CP or dyspnea   Objective:  Filed Vitals:   06/07/12 0831 06/07/12 1444 06/07/12 2109 06/08/12 0452  BP:  149/86 141/83 136/99  Pulse:  74 73 70  Temp:  97.1 F (36.2 C) 97.8 F (36.6 C) 98.6 F (37 C)  TempSrc:  Oral Axillary Oral  Resp:  20 20 20   Height:      Weight:      SpO2: 98% 98% 100% 100%    Intake/Output from previous day:  Intake/Output Summary (Last 24 hours) at 06/08/12 0748 Last data filed at 06/07/12 2300  Gross per 24 hour  Intake    720 ml  Output   1500 ml  Net   -780 ml    Physical Exam: Physical exam: Well-developed well-nourished in no acute distress.  Skin is warm and dry.  HEENT is normal.  Neck is supple.  Chest is clear to auscultation with normal expansion.  Cardiovascular exam is regular rate and rhythm.  Abdominal exam nontender or distended. No masses palpated. Extremities show no edema. neuro grossly intact    Lab Results: Basic Metabolic Panel:  Recent Labs  62/13/08 1613  NA 138  K 3.8  CL 102  CO2 29  GLUCOSE 94  BUN 19  CREATININE 0.99  CALCIUM 9.2   CBC:  Recent Labs  06/06/12 1613  06/07/12 0703 06/07/12 1227  WBC 5.3  < > 5.6 5.2  NEUTROABS 3.5  --   --   --   HGB 8.8*  < > 9.3* 9.4*  HCT 29.2*  < > 30.1* 30.1*  MCV 80.2  < > 81.1 80.9  PLT 190  < > 227 194  < > = values in this interval not displayed. Cardiac Enzymes:  Recent Labs  06/07/12 0130 06/07/12 0703 06/07/12 1227  TROPONINI <0.30 <0.30 <0.30     Assessment/Plan:  1 syncope-event appears to be orthostatic mediated. Interrogation of device shows no events. She describes recent increased orthostatic symptoms. We will decrease Lasix to 40 mg daily. Her anemia may also be contributing. 2 cardiomyopathy-continue ARB and beta blocker.  3 heme positive stool/anemia-she is for endoscopy today. 4 history of atrial fibrillation-continue beta blocker. xeralto on hold due to anemia and heme positive stool. Resume later  when Hgb stable. 5 hypertension-continue present medications. Patient will need close followup with Dr. Ladona Ridgel after discharge. [name] 06/08/2012, 7:48 AM

## 2012-06-08 NOTE — Progress Notes (Signed)
[facility] Progress Note  SUBJECTIVE: feels okay today  OBJECTIVE:  Vital signs in last 24 hours: Temp:  [97.1 F (36.2 C)-98.6 F (37 C)] 98.6 F (37 C) (04/30 0452) Pulse Rate:  [70-74] 70 (04/30 0452) Resp:  [20] 20 (04/30 0452) BP: (136-149)/(83-99) 136/99 mmHg (04/30 0452) SpO2:  [98 %-100 %] 100 % (04/30 0811) Last BM Date: 06/08/12 General:    Pleasant black female in NAD Abdomen:  Soft, nontender and nondistended. Normal bowel sounds. Extremities:  Without edema. Neurologic:  Alert and oriented,  grossly normal neurologically. Psych:  Cooperative. Normal mood and affect.  Lab Results:  Recent Labs  06/07/12 0130 06/07/12 0703 06/07/12 1227  WBC 5.9 5.6 5.2  HGB 9.3* 9.3* 9.4*  HCT 29.9* 30.1* 30.1*  PLT 203 227 194    ASSESSMENT / PLAN: 1. Pleasant 79 year old female with iron deficiency anemia, on Xarelto. Overall, hemoglobin down about 4 grams from baseline. No overt bleeding.  Patient for EGD / colonoscopy in am for further evaluation.   2. Multiple medical problems including, but not limited to CHF, afib, symptomatic tachy / brady syndrome. She is s/p biventricular pacemaker placement in January of this year    LOS: 2 days   Willette Cluster  06/08/2012, 11:27 AM   [facility] Attending  I have also seen and assessed the patient and agree with the above note.  For endoscopic evaluation tomorrow.  Iva Boop, MD, [facility] [phone] (pager) 06/08/2012 3:40 PM

## 2012-06-09 ENCOUNTER — Encounter (HOSPITAL_COMMUNITY)
Admission: EM | Disposition: A | Discharge status: Home / Self Care | Payer: Self-pay | Source: Home / Self Care | Attending: Family Medicine

## 2012-06-09 ENCOUNTER — Encounter (HOSPITAL_COMMUNITY): Payer: Self-pay | Admitting: *Deleted

## 2012-06-09 DIAGNOSIS — K31819 Angiodysplasia of stomach and duodenum without bleeding: Secondary | ICD-10-CM

## 2012-06-09 HISTORY — PX: COLONOSCOPY: SHX5424

## 2012-06-09 HISTORY — DX: Angiodysplasia of stomach and duodenum without bleeding: K31.819

## 2012-06-09 HISTORY — PX: HOT HEMOSTASIS: SHX5433

## 2012-06-09 HISTORY — PX: ESOPHAGOGASTRODUODENOSCOPY: SHX5428

## 2012-06-09 LAB — BASIC METABOLIC PANEL
CO2: 25 mEq/L (ref 19–32)
Glucose, Bld: 89 mg/dL (ref 70–99)
Potassium: 3.1 mEq/L — ABNORMAL LOW (ref 3.5–5.1)
Sodium: 139 mEq/L (ref 135–145)

## 2012-06-09 LAB — CBC: HCT: 33.9 % — ABNORMAL LOW (ref 36.0–46.0)

## 2012-06-09 SURGERY — EGD (ESOPHAGOGASTRODUODENOSCOPY)
Anesthesia: Moderate Sedation

## 2012-06-09 MED ORDER — BUTAMBEN-TETRACAINE-BENZOCAINE 2-2-14 % EX AERO
INHALATION_SPRAY | CUTANEOUS | Status: DC | PRN
  Administered 2012-06-09: 2 via TOPICAL

## 2012-06-09 MED ORDER — RIVAROXABAN 20 MG PO TABS: 20.0000 mg | ORAL_TABLET | Freq: Every day | ORAL | Status: DC

## 2012-06-09 MED ORDER — POTASSIUM CHLORIDE CRYS ER 20 MEQ PO TBCR
40.0000 meq | EXTENDED_RELEASE_TABLET | Freq: Once | ORAL | Status: AC
  Administered 2012-06-09: 40 meq via ORAL
  Filled 2012-06-09: qty 2

## 2012-06-09 MED ORDER — FUROSEMIDE 80 MG PO TABS: 40.0000 mg | ORAL_TABLET | Freq: Every morning | ORAL | Status: DC

## 2012-06-09 MED ORDER — MIDAZOLAM HCL 10 MG/2ML IJ SOLN
INTRAMUSCULAR | Status: AC
  Filled 2012-06-09: qty 4

## 2012-06-09 MED ORDER — SODIUM CHLORIDE 0.9 % IV SOLN
INTRAVENOUS | Status: DC
  Administered 2012-06-09: 1000 mL via INTRAVENOUS

## 2012-06-09 MED ORDER — FENTANYL CITRATE 0.05 MG/ML IJ SOLN
INTRAMUSCULAR | Status: DC | PRN
  Administered 2012-06-09 (×4): 25 ug via INTRAVENOUS

## 2012-06-09 MED ORDER — FENTANYL CITRATE 0.05 MG/ML IJ SOLN
INTRAMUSCULAR | Status: AC
  Filled 2012-06-09: qty 4

## 2012-06-09 MED ORDER — MIDAZOLAM HCL 10 MG/2ML IJ SOLN
INTRAMUSCULAR | Status: DC | PRN
  Administered 2012-06-09 (×5): 2 mg via INTRAVENOUS

## 2012-06-09 MED ORDER — SODIUM CHLORIDE 0.9 % IV SOLN
1020.0000 mg | Freq: Once | INTRAVENOUS | Status: DC
  Filled 2012-06-09: qty 34

## 2012-06-09 MED ORDER — MIDAZOLAM HCL 5 MG/5ML IJ SOLN
INTRAMUSCULAR | Status: DC | PRN
  Administered 2012-06-09: 1 mg via INTRAVENOUS

## 2012-06-09 MED ORDER — DIPHENHYDRAMINE HCL 50 MG/ML IJ SOLN
INTRAMUSCULAR | Status: AC
  Filled 2012-06-09: qty 1

## 2012-06-09 NOTE — Progress Notes (Signed)
   Subjective:  Denies CP or dyspnea   Objective:  Filed Vitals:   06/08/12 0811 06/08/12 1350 06/08/12 2059 06/09/12 0434  BP:  149/70 146/68 166/79  Pulse:  70 73 73  Temp:  97.3 F (36.3 C) 97.8 F (36.6 C)   TempSrc:  Tympanic Oral Oral  Resp:  16 18 19   Height:      Weight:      SpO2: 100% 100% 100% 100%    Intake/Output from previous day:  Intake/Output Summary (Last 24 hours) at 06/09/12 0834 Last data filed at 06/08/12 2200  Gross per 24 hour  Intake    480 ml  Output    750 ml  Net   -270 ml    Physical Exam: Physical exam: Well-developed well-nourished in no acute distress.  Skin is warm and dry.  HEENT is normal.  Neck is supple.  Chest is clear to auscultation with normal expansion.  Cardiovascular exam is regular rate and rhythm.  Abdominal exam nontender or distended. No masses palpated. Extremities show no edema. neuro grossly intact    Lab Results: Basic Metabolic Panel:  Recent Labs  47/82/95 1613 06/09/12 0450  NA 138 139  K 3.8 3.1*  CL 102 102  CO2 29 25  GLUCOSE 94 89  BUN 19 15  CREATININE 0.99 0.84  CALCIUM 9.2 9.4   CBC:  Recent Labs  06/06/12 1613  06/07/12 1227 06/09/12 0450  WBC 5.3  < > 5.2 5.7  NEUTROABS 3.5  --   --   --   HGB 8.8*  < > 9.4* 10.9*  HCT 29.2*  < > 30.1* 33.9*  MCV 80.2  < > 80.9 79.4  PLT 190  < > 194 222  < > = values in this interval not displayed. Cardiac Enzymes:  Recent Labs  06/07/12 0130 06/07/12 0703 06/07/12 1227  TROPONINI <0.30 <0.30 <0.30     Assessment/Plan:  1 syncope-event appears to be orthostatic mediated. Interrogation of device shows no events. She describes recent increased orthostatic symptoms. Continue reduced dose Lasix 40 mg daily (additional 40 mg po daily PRN weight gain of 2-3 lbs or edema/dyspnea). Her anemia may also be contributing. 2 cardiomyopathy-continue ARB and beta blocker.  3 heme positive stool/anemia-she is for endoscopy today. 4 history of  atrial fibrillation-continue beta blocker. Xeralto on hold due to anemia and heme positive stool. Resume later when Hgb stable. 5 hypertension-continue present medications. Patient will need close followup with Dr. Ladona Ridgel after discharge. Olga Millers 06/09/2012, 8:34 AM

## 2012-06-09 NOTE — Clinical Documentation Improvement (Signed)
Anemia Blood Loss Clarification  THIS DOCUMENT IS NOT A PERMANENT PART OF THE MEDICAL RECORD  RESPOND TO THE THIS QUERY, FOLLOW THE INSTRUCTIONS BELOW:  1. If needed, update documentation for the patient's encounter via the notes activity.  2. Access this query again and click edit on the In [employer].  3. After updating, or not, click F2 to complete all highlighted (required) fields concerning your review. Select "additional documentation in the medical record" OR "no additional documentation provided".  4. Click Sign note button.  5. The deficiency will fall out of your In Basket *Please let us know if you are not able to complete this workflow by phone or e-mail (listed below).        06/09/12  Dear Dr. Suanne Marker, [name]  In an effort to better capture your patient's severity of illness, reflect appropriate length of stay and utilization of resources, a review of the patient medical record has revealed the following indicators.    Based on your clinical judgment, please clarify and document in a progress note and/or discharge summary the clinical condition associated with the following supporting information:  In responding to this query please exercise your independent judgment.  The fact that a query is asked, does not imply that any particular answer is desired or expected.  Possible Clinical Conditions?   " Expected Acute Blood Loss Anemia  " Acute Blood Loss Anemia  " Acute on chronic blood loss anemia  " Other Condition________________  " Cannot Clinically Determine    Risk Factors:   Syncope  GIB  Pulmonary HTN,   AFIB  CAD  Anemia  Chronic Systolic Heart failure  Supporting Information:  Signs and Symptoms   Diagnostics: Component     Latest Ref Rng 06/06/2012 06/07/2012 06/07/2012 06/07/2012 06/09/2012          1:30 AM  7:03 AM 12:27 PM   Hemoglobin     12.0 - 15.0 g/dL 8.8 (L) 9.3 (L) 9.3 (L) 9.4 (L) 10.9 (L)  HCT     36.0 - 46.0 % 29.2  (L) 29.9 (L) 30.1 (L) 30.1 (L) 33.9 (L)    Treatments: Transfusion: IV fluids / plasma expanders: Serial H&H monitoring Medications   Vitamin B12   Ferritin   Iron and TIBC       Reviewed:  no additional documentation provided ljh  Thank You,  Enis Slipper  RN, BSN, MSN/Inf, CCDS Clinical Documentation Specialist [facility] Pager: [phone] / E-mail: [email or web address]  Health Information Management [employer]

## 2012-06-09 NOTE — Op Note (Signed)
[facility] [address] [place], [address]   ENDOSCOPY PROCEDURE REPORT  PATIENT: [name], [name]  MR#: [id number] BIRTHDATE: June 04, 1932 , 80  yrs. old GENDER: Female ENDOSCOPIST: Iva Boop, MD, [facility] REFERRED BY:  [facility] PROCEDURE DATE:  06/09/2012 PROCEDURE:  EGD w/ ablation ASA CLASS:     Class III INDICATIONS:  Iron deficiency anemia. MEDICATIONS: There was residual sedation effect present from prior procedure. TOPICAL ANESTHETIC: Cetacaine Spray  DESCRIPTION OF PROCEDURE: After the risks benefits and alternatives of the procedure were thoroughly explained, informed consent was obtained.  The    endoscope was introduced through the mouth and advanced to the second portion of the duodenum. Without limitations.  The instrument was slowly withdrawn as the mucosa was fully examined.        STOMACH:  A small angiodysplastic lesion was found in the gastric antrum.   It was ablated with argon plasma coagulator.  The remainder of the upper endoscopy exam was otherwise normal. Retroflexed views revealed no abnormalities.     The scope was then withdrawn from the patient and the procedure completed.  COMPLICATIONS: There were no complications. ENDOSCOPIC IMPRESSION: 1.   Angiodysplastic lesion was found and ablated with APC in the gastric antrum - this could be source of anemia 2.   The remainder of the upper endoscopy exam was otherwise normal  RECOMMENDATIONS: 1.  Wait 5 days to reatsrt Xarelto 2.  Replete iron -  consider infusion while here Serial Hgb (I suggest monthly and eventually quarterly) If she is not maintaining hgb then need to consider enteroscopy/capsule endoscopy small bowel See GI prn at this point   eSigned:  Iva Boop, MD, [facility] 06/09/2012 11:33 AM

## 2012-06-09 NOTE — Op Note (Signed)
[facility] [address] [place], [address]   COLONOSCOPY PROCEDURE REPORT  PATIENT: Sharon, Hancock  MR#: [id number] BIRTHDATE: Jul 30, 1932 , 80  yrs. old GENDER: Female ENDOSCOPIST: Iva Boop, MD, [facility] PROCEDURE DATE:  06/09/2012 PROCEDURE:   Colonoscopy, diagnostic ASA CLASS:   Class III INDICATIONS:Iron Deficiency Anemia. MEDICATIONS: Fentanyl 100 mcg IV and Versed 10 mg IV  DESCRIPTION OF PROCEDURE:   After the risks benefits and alternatives of the procedure were thoroughly explained, informed consent was obtained.  A digital rectal exam revealed no abnormalities of the rectum.   The     endoscope was introduced through the anus and advanced to the terminal ileum which was intubated for a short distance. No adverse events experienced. The quality of the prep was adequate, using MoviPrep  The instrument was then slowly withdrawn as the colon was fully examined.      COLON FINDINGS: There was severe diverticulosis noted in the sigmoid colon with associated angulation and luminal narrowing.   The colon mucosa was otherwise normal.   The mucosa appeared normal in the terminal ileum.  Retroflexed views revealed no abnormalities. The time to cecum=14 minutes 0 seconds. Abdominal pressure and position changes used. Withdrawal time=9 minutes 0 seconds.  The scope was withdrawn and the procedure completed. COMPLICATIONS: There were no complications.  ENDOSCOPIC IMPRESSION: 1.   There was severe diverticulosis noted in the sigmoid colon 2.   The colon mucosa was otherwise normal - adequate prep 3.   Normal mucosa in the terminal ileum  RECOMMENDATIONS: Upper endoscopy will be next to look for cause of anemia (not found here) Would use deep sedation if possible for future exams   eSigned:  Iva Boop, MD, [facility] 06/09/2012 11:24 AM

## 2012-06-09 NOTE — Discharge Summary (Signed)
Physician Discharge Summary  [name] ONG:[id number] DOB: 1932-07-31 DOA: 06/06/2012  PCP: [name], MD  Admit date: 06/06/2012 Discharge date: 06/09/2012  Time spent: >30 minutes  Recommendations for Outpatient Follow-up:    Discharge Diagnoses:  Principal Problem:   Syncope and collapse Active Problems:   PPM-[place]   Atrial fibrillation   Long term (current) use of anticoagulants   Nonischemic cardiomyopathy   Chronic systolic heart failure   Anemia   Hypertension   Iron deficiency anemia, unspecified   Angiodysplasia of stomach   Discharge Condition: Improved/stable  Diet recommendation: Heart healthy  Filed Weights   06/07/12 0045  Weight: 84.2 kg (185 lb 10 oz)    History of present illness:  [name] is an 79 y.o. female with hx of afib on Xarelto, HTN, S/P ppm for complete HB, s/p epicardial lead placement 2/14, chronic systolic HF, nonobtructive CAD, sent in by PCP for a CXR as she was having palpitation, suffered LOC at the hospital. She thought she was feeling lightheaded, but no palpitation prior to syncope. No HA, chest pain, nauseaor vomiting. She denied any fever or chills. No seizure activities. She admitted to having black stool being on iron supplement. In Dec 13, she was admitted for DOE, and was found to have anemia requiring PRBC, but her guaic was negative, so no GI was consulted. She had some bleeding hemorrhoids, and denied any significant melena or BRBPR. She denied any abdominal pain. Evaluation in the ER included a Hb of 8.8g/DL, BUN 19, and normal renal fx tests. Her CXR was negative except for a persistent small pleural effusion. Her head CT and right knee films were negative. Hospitalist was asked to admit her for syncope work up and GI bleed.   Hospital Course:  1. Syncope- it was noted on admission that Patient had a new biventricular pacemaker inserted on 02/19/2012. -pacemaker was interrogated per cards and no abnormalities reported.  Cardiac enzymes x 3 are negative. Likely secondary to orthostasis and anemia.her Lasix dose was decreased this hospitalization per cardiology. For her iron deficiency anemia she has received IV iron-feraheme today prior to discharge. Her hemoglobin has remained stable, she'll be discharged on oral iron and is to follow up outpatient. 2. Lower GI Bleed- her hemoglobin was cycled during this hospital stay and remained stable. It was noted that she was heme positive and GI was consulted and saw patient and she had endoscopies done which revealed angiodysplasia in the gastric antrum and per GI likely the source of her anemia.as discussed above she has received IV iron today prior to discharge per GI recommendations and is to continue oral iron upon discharge. I discussed anticoagulation with Dr. Leone Payor and he recommends resuming aspirin at this time, and patient is to hold off xarelto for 5 more days and then resume. Her hemoglobin today prior to discharge is 10.9. She is tolerating by mouth well. He recommends for patient to have monthly hemoglobins to monitor for stability per his PCP and referral back to GI if she is not maintaining her hemoglobin and the evaluation/possible enteroscopy/capsule endoscopy small bowel.  3. Coronary artery disease- Continue with coreg, resuming aspirin at this time as per GI recommendations.  4. Chronic systolic CHF- Well compensated,lasix decreased to 40 mg po daily today 4/30 per cardiology see #1. 5. Atrial fibrillation- xarelto  was  held for colonoscopy HR is well controlled.As discussed above discussed with GI today and they recommended restarting her aspirin are, she is to hold off this or  all toe for 5 more days and resume it thereafter. She is also to followup with Dr. Lewayne Bunting in about one week -for close monitoring as per cardiology recommendations.     Procedures:  EGD with ablation today 5/1 per Dr. Idelle Leech small angiodysplastic lesion found in the  gastric antrum, ablated with argon plasma coagulator.  Colonoscopy today 5/1 per Dr. Leone Payor at-severe diverticulosis noted in the sigmoid colon. Colon mucosa otherwise normal. Normal mucosa in the terminal ileum.  Consultations:  GI-Dr. Leone Payor  Cardiology-Dr. Crenshaw  Discharge Exam: Filed Vitals:   06/09/12 1112 06/09/12 1122 06/09/12 1132 06/09/12 1300  BP: 129/58 140/59 150/75 156/69  Pulse:    70  Temp: 97.8 F (36.6 C)   98.5 F (36.9 C)  TempSrc: Axillary   Oral  Resp: 16 17 19 16   Height:      Weight:      SpO2: 99% 99% 95% 100%      Discharge Instructions  Discharge Orders   Future Orders Complete By Expires     Diet - low sodium heart healthy  As directed     Increase activity slowly  As directed         Medication List    TAKE these medications       ADVAIR DISKUS 250-50 MCG/DOSE Aepb  Generic drug:  Fluticasone-Salmeterol  Inhale 1 puff into the lungs 2 (two) times daily.     aspirin EC 81 MG tablet  Take 81 mg by mouth every morning.     atorvastatin 40 MG tablet  Commonly known as:  LIPITOR  Take 20 mg by mouth every morning. Take 1/2 - 1 tab daily     carvedilol 25 MG tablet  Commonly known as:  COREG  Take 25 mg by mouth 2 (two) times daily with a meal.     COMBIVENT RESPIMAT 20-100 MCG/ACT Aers respimat  Generic drug:  Ipratropium-Albuterol  Inhale 1 puff into the lungs every 6 (six) hours as needed for wheezing or shortness of breath.     cyclobenzaprine 5 MG tablet  Commonly known as:  FLEXERIL  Take 5 mg by mouth at bedtime. Take 1 tab po q hs     DULoxetine 30 MG capsule  Commonly known as:  CYMBALTA  Take 30 mg by mouth every morning.     ferrous sulfate 325 (65 FE) MG tablet  Take 1 tablet (325 mg total) by mouth 3 (three) times daily with meals.     furosemide 80 MG tablet  Commonly known as:  LASIX  Take 0.5 tablets (40 mg total) by mouth every morning.     irbesartan 150 MG tablet  Commonly known as:  AVAPRO  Take  150 mg by mouth every morning.     lansoprazole 15 MG capsule  Commonly known as:  PREVACID  Take 15 mg by mouth every morning.     potassium chloride 10 MEQ tablet  Commonly known as:  K-DUR  Take 1 tablet (10 mEq total) by mouth 2 (two) times daily.     Rivaroxaban 20 MG Tabs  Commonly known as:  XARELTO  Take 1 tablet (20 mg total) by mouth daily with supper.     tiotropium 18 MCG inhalation capsule  Commonly known as:  SPIRIVA HANDIHALER  Place 1 capsule (18 mcg total) into inhaler and inhale daily.       Allergies  Allergen Reactions  . Angiotensin Receptor Blockers        Follow-up Information  Follow up with [name], MD. (in 1week, call for appt upon discharge)    Contact information:   [address] [place] [address] [phone]       Follow up with Lewayne Bunting, MD. (in 1week, call for appt upon discharge)    Contact information:   [address] [place] [address] [phone]        The results of significant diagnostics from this hospitalization (including imaging, microbiology, ancillary and laboratory) are listed below for reference.    Significant Diagnostic Studies: Dg Chest 2 View  06/06/2012  *RADIOLOGY REPORT*  Clinical Data: Syncope, fall.  Knee pain.  CHEST - 2 VIEW  Comparison: 05/04/2012  Findings: Left pacer remains in place, unchanged.  Cardiomegaly. No overt edema.  Left base atelectasis or infiltrate, slightly improved.  Small left pleural effusion also slightly improved.  No focal opacity on the right.  IMPRESSION: Improving but persistent small left pleural effusion and left base atelectasis.   Original Report Authenticated By: Charlett Nose, M.D.    Ct Head Wo Contrast  06/06/2012  *RADIOLOGY REPORT*  Clinical Data: Syncopal episode.  Head trauma.  CT HEAD WITHOUT CONTRAST  Technique:  Contiguous axial images were obtained from the base of the skull through the vertex without contrast.   Comparison: None.  Findings: The brain shows mild age related atrophy.  There is no evidence of old or acute focal infarction, mass lesion, hemorrhage, hydrocephalus or extra-axial collection.  No skull fracture.  No fluid in the sinuses, middle ears or mastoids.  IMPRESSION: No acute or traumatic finding.  Mild age related atrophy.   Original Report Authenticated By: Paulina Fusi, M.D.    Dg Knee Complete 4 Views Right  06/06/2012  *RADIOLOGY REPORT*  Clinical Data: Syncope, fall, knee pain.  RIGHT KNEE - COMPLETE 4+ VIEW  Comparison: 06/30/2006  Findings: Changes of right knee replacement.  Old healed proximal right femoral fracture.  No hardware complicating feature.  No acute fracture, subluxation or dislocation.  No joint effusion.  IMPRESSION: No acute bony abnormality.   Original Report Authenticated By: Charlett Nose, M.D.     Microbiology: No results found for this or any previous visit (from the past 240 hour(s)).   Labs: Basic Metabolic Panel:  Recent Labs Lab 06/06/12 1613 06/09/12 0450  NA 138 139  K 3.8 3.1*  CL 102 102  CO2 29 25  GLUCOSE 94 89  BUN 19 15  CREATININE 0.99 0.84  CALCIUM 9.2 9.4   Liver Function Tests:  Recent Labs Lab 06/06/12 1613  AST 14  ALT 9  ALKPHOS 74  BILITOT 0.5  PROT 5.6*  ALBUMIN 3.2*   No results found for this basename: LIPASE, AMYLASE,  in the last 168 hours No results found for this basename: AMMONIA,  in the last 168 hours CBC:  Recent Labs Lab 06/06/12 1613 06/07/12 0130 06/07/12 0703 06/07/12 1227 06/09/12 0450  WBC 5.3 5.9 5.6 5.2 5.7  NEUTROABS 3.5  --   --   --   --   HGB 8.8* 9.3* 9.3* 9.4* 10.9*  HCT 29.2* 29.9* 30.1* 30.1* 33.9*  MCV 80.2 80.8 81.1 80.9 79.4  PLT 190 203 227 194 222   Cardiac Enzymes:  Recent Labs Lab 06/07/12 0130 06/07/12 0703 06/07/12 1227  TROPONINI <0.30 <0.30 <0.30   BNP: BNP (last 3 results)  Recent Labs  01/22/12 1245 01/24/12 0550 03/12/12 0945  PROBNP 2781.0* 2122.0*  5680.0*   CBG:  Recent Labs Lab 06/06/12 1612  GLUCAP 88       Signed:  Xochitl Egle C  [facility] 06/09/2012, 4:22 PM

## 2012-06-10 ENCOUNTER — Encounter (HOSPITAL_COMMUNITY): Payer: Self-pay | Admitting: Internal Medicine

## 2012-06-13 ENCOUNTER — Telehealth: Payer: Self-pay | Admitting: Internal Medicine

## 2012-06-13 ENCOUNTER — Encounter: Payer: Self-pay | Admitting: Internal Medicine

## 2012-06-13 NOTE — Telephone Encounter (Signed)
06-07-12 called pt to get her in to see taylor as soon as possible, per husband she fell and can not come in at this time , will call back/mt 06-13-12 sent reminder letter to call to set up appt with taylor as soon as she's able/mt

## 2012-06-22 ENCOUNTER — Encounter: Payer: Medicare Other | Admitting: Internal Medicine

## 2012-06-29 ENCOUNTER — Encounter: Payer: Self-pay | Admitting: *Deleted

## 2012-06-29 ENCOUNTER — Ambulatory Visit (INDEPENDENT_AMBULATORY_CARE_PROVIDER_SITE_OTHER): Payer: Medicare Other | Admitting: Internal Medicine

## 2012-06-29 ENCOUNTER — Encounter: Payer: Self-pay | Admitting: Internal Medicine

## 2012-06-29 VITALS — BP 141/80 | HR 73 | Ht 71.0 in | Wt 193.0 lb

## 2012-06-29 DIAGNOSIS — I495 Sick sinus syndrome: Secondary | ICD-10-CM

## 2012-06-29 DIAGNOSIS — I5023 Acute on chronic systolic (congestive) heart failure: Secondary | ICD-10-CM

## 2012-06-29 DIAGNOSIS — T82190A Other mechanical complication of cardiac electrode, initial encounter: Secondary | ICD-10-CM

## 2012-06-29 DIAGNOSIS — I509 Heart failure, unspecified: Secondary | ICD-10-CM

## 2012-06-29 DIAGNOSIS — T82111A Breakdown (mechanical) of cardiac pulse generator (battery), initial encounter: Secondary | ICD-10-CM

## 2012-06-29 LAB — PACEMAKER DEVICE OBSERVATION
BRDY-0002RV: 70 {beats}/min
BRDY-0004RV: 120 {beats}/min
RV LEAD AMPLITUDE: 12 mv
RV LEAD THRESHOLD: 1 V
VENTRICULAR PACING PM: 98

## 2012-06-29 LAB — CBC WITH DIFFERENTIAL/PLATELET
Basophils Absolute: 0.1 10*3/uL (ref 0.0–0.1)
Basophils Relative: 1.3 % (ref 0.0–3.0)
Hemoglobin: 11.5 g/dL — ABNORMAL LOW (ref 12.0–15.0)
Lymphocytes Relative: 24.6 % (ref 12.0–46.0)
Monocytes Relative: 10.7 % (ref 3.0–12.0)
Neutro Abs: 3.2 10*3/uL (ref 1.4–7.7)
Neutrophils Relative %: 59.5 % (ref 43.0–77.0)
RBC: 4.14 Mil/uL (ref 3.87–5.11)

## 2012-06-29 LAB — BASIC METABOLIC PANEL
BUN: 20 mg/dL (ref 6–23)
CO2: 30 mEq/L (ref 19–32)
Chloride: 105 mEq/L (ref 96–112)
Creatinine, Ser: 1.1 mg/dL (ref 0.4–1.2)
Potassium: 4.3 mEq/L (ref 3.5–5.1)

## 2012-06-29 LAB — PROTIME-INR
INR: 1.5 ratio — ABNORMAL HIGH (ref 0.8–1.0)
Prothrombin Time: 16 s — ABNORMAL HIGH (ref 10.2–12.4)

## 2012-06-29 NOTE — Patient Instructions (Addendum)
You will need to have lab work done today before your procedure tomorrow.  Your physician recommends that you continue on your current medications as directed. Please refer to the Current Medication list given to you today.   Your pacemaker lead revision is scheduled for Jun 30, 2012.  You will need to be at the hospital at 8:30am Please refer to the instruction sheet you have been given today

## 2012-06-29 NOTE — Progress Notes (Signed)
HPI [name] returns today for followup. She is a 79 year old woman with a history of atrial fibrillation, complete heart block, status post biventricular pacemaker upgrade in several months ago. She subsequently developed diaphragmatic stimulation which could not be programmed around. The patient underwent epicardial LV lead placement by Dr. Laneta Simmers a couple of months ago. Her procedure was complicated by a left pleural effusion. She is subsequently been hospitalized with heart failure symptoms. She returns today for followup. She denies chest pain but has had peripheral edema and one episode of syncope, as well as worsening class III heart failure symptoms. Her chest x-ray has been reviewed. When I saw her several weeks ago, her pacing thresholds were elevated. No Known Allergies   Current Outpatient Prescriptions  Medication Sig Dispense Refill  . ADVAIR DISKUS 250-50 MCG/DOSE AEPB Inhale 1 puff into the lungs 2 (two) times daily.       [name] aspirin EC 81 MG tablet Take 81 mg by mouth every morning.       [name] atorvastatin (LIPITOR) 40 MG tablet Take 20 mg by mouth every morning. Take 1/2 - 1 tab daily      . carvedilol (COREG) 25 MG tablet Take 25 mg by mouth 2 (two) times daily with a meal.        . COMBIVENT RESPIMAT 20-100 MCG/ACT AERS respimat Inhale 1 puff into the lungs every 6 (six) hours as needed for wheezing or shortness of breath.       . cyclobenzaprine (FLEXERIL) 5 MG tablet Take 5 mg by mouth at bedtime. Take 1 tab po q hs      . DULoxetine (CYMBALTA) 30 MG capsule Take 30 mg by mouth every morning.       . ferrous sulfate 325 (65 FE) MG tablet Take 1 tablet (325 mg total) by mouth 3 (three) times daily with meals.  30 tablet  0  . furosemide (LASIX) 80 MG tablet Take 0.5 tablets (40 mg total) by mouth every morning.  30 tablet  9  . irbesartan (AVAPRO) 150 MG tablet Take 150 mg by mouth every morning.      . lansoprazole (PREVACID) 15 MG capsule Take 15 mg by mouth every morning.        . potassium chloride (K-DUR) 10 MEQ tablet Take 1 tablet (10 mEq total) by mouth 2 (two) times daily.  30 tablet  0  . tiotropium (SPIRIVA HANDIHALER) 18 MCG inhalation capsule Place 1 capsule (18 mcg total) into inhaler and inhale daily.  30 capsule  11  . traMADol (ULTRAM) 50 MG tablet One tab daily       No current facility-administered medications for this visit.     Past Medical History  Diagnosis Date  . HTN (hypertension)   . Hernia     Incisional hernia, umbilicus.  . H/O: hysterectomy   . CHB (complete heart block)     s/p pacer (Tenant => GT)  . Atrial fibrillation     on xarleto  . Depression   . Goiter   . DJD (degenerative joint disease)   . GERD (gastroesophageal reflux disease)   . Diverticulosis   . Cardiomyopathy secondary     Myoview 3/13: small inf scar apex to base with mild peri-infarct isch; EF 32%;   LHC 05/12/11: pLAD 20%, pCFX 20%, mid 30%, m+dRCA with mild luminal irregs; EF 35% with mid inf wal and inf-apical HK(no culprit for segmental WMA)  . CAD (coronary artery disease)     nonobs  by [facility] 4/13  . Systolic CHF   . Pacemaker     upgrade to BiV PPM 02/19/12 (LV lead off)  . H/O hiatal hernia   . LBBB (left bundle branch block)   . Iron deficiency anemia, unspecified 06/07/2012  . Angiodysplasia of stomach 06/09/2012    ROS:   All systems reviewed and negative except as noted in the HPI.   Past Surgical History  Procedure Laterality Date  . Pacemaker placement  , 2002.  [name] Cholecystectomy    . Insert / replace / remove pacemaker    . Abdominal hysterectomy    . Tubal ligation    . Small intestine surgery    . Cardiac catheterization    . Epicardial pacing lead placement Left 03/17/2012    Procedure: EPICARDIAL PACING LEAD PLACEMENT;  Surgeon: [name], MD;  Location: [facility] OR;  Service: Thoracic;  Laterality: Left;  placement of LV epicardial leads  . Esophagogastroduodenoscopy N/A 06/09/2012    Procedure: ESOPHAGOGASTRODUODENOSCOPY (EGD);   Surgeon: Iva Boop, MD;  Location: [facility];  Service: Endoscopy;  Laterality: N/A;  . Colonoscopy N/A 06/09/2012    Procedure: COLONOSCOPY;  Surgeon: Iva Boop, MD;  Location: [facility] ENDOSCOPY;  Service: Endoscopy;  Laterality: N/A;  . Hot hemostasis N/A 06/09/2012    Procedure: HOT HEMOSTASIS (ARGON PLASMA COAGULATION/BICAP);  Surgeon: Iva Boop, MD;  Location: [facility];  Service: Endoscopy;  Laterality: N/A;     Family History  Problem Relation Age of Onset  . Adopted: Yes  . Coronary artery disease    . Other      stomach carcinoma  . Stomach cancer Other   . Coronary artery disease Other   . Cancer Mother      History   Social History  . Marital Status: Married    Spouse Name: [name]    Number of Children: 3  . Years of Education: 14   Occupational History  .     Social History Main Topics  . Smoking status: Former Smoker    Types: Cigarettes    Quit date: 01/21/2009  . Smokeless tobacco: Former Neurosurgeon    Quit date: 01/21/2009  . Alcohol Use: No  . Drug Use: No  . Sexually Active: No   Other Topics Concern  . Not on file   Social History Narrative   Marital Status:  Married [name])   Children:  3 (Daughter 1955,  Son 89, Son 1964)   Living Situation: Lives with spouse in a one-level home with 3 steps to enter.  They moved to [facility] [place] from [place] since [name] is from this area.     Occupation: Retired Proofreader)   Education: Associates Degree   Tobacco Use/Exposure: 3 packs per week for > 50 years; She quit smoking in 2010     Alcohol Use: Occasional   Drug Use:  None   Diet:  Healthy   Exercise:  None   Hobbies:  Crossword Puzzles, Knitting, Reading, Sewing                        BP 141/80  Pulse 73  Ht 5\' 11"  (1.803 m)  Wt 193 lb (87.544 kg)  BMI 26.93 kg/m2  Physical Exam:  Well appearing 79 year old woman,NAD HEENT: Unremarkable Neck:  7 cm JVD, no thyromegally Back:  No CVA tenderness Lungs:  Clear except for rales in the  bases bilaterally. HEART:  Regular rate rhythm, no murmurs, no rubs, no clicks  Abd:  soft, positive bowel sounds, no organomegally, no rebound, no guarding Ext:  2 plus pulses, no edema, no cyanosis, no clubbing Skin:  No rashes no nodules Neuro:  CN II through XII intact, motor grossly intact  EKG Atrial fibrillation with ventricular pacing DEVICE  Device interrogation today demonstrates that her left ventricular lead is not capturing. Chest x-ray - her chest x-ray from several weeks ago demonstrates that one of her epicardial left ventricular lead had dislodged.    Assess/Plan:LAD

## 2012-06-29 NOTE — Assessment & Plan Note (Signed)
Her heart failure symptoms are class III. I suspect this is in part related to her RV only pacing.

## 2012-06-29 NOTE — Assessment & Plan Note (Signed)
Her left ventricular lead is not capturing in the chest x-ray demonstrates that it has dislodged. She does have another epicardial lead. I've discussed the treatment options with the patient. We will schedule her for pacemaker revision. My plan is to cap thefallen down left ventricular lead and replace it with the lead that is still attached to the left ventricular epicardial surface.

## 2012-06-30 ENCOUNTER — Encounter (HOSPITAL_COMMUNITY)
Admission: RE | Disposition: A | Discharge status: Home / Self Care | Payer: Self-pay | Source: Ambulatory Visit | Attending: Internal Medicine

## 2012-06-30 ENCOUNTER — Observation Stay (HOSPITAL_COMMUNITY)
Admission: RE | Admit: 2012-06-30 | Discharge: 2012-07-01 | Disposition: A | Discharge status: Home / Self Care | Payer: Medicare Other | Source: Ambulatory Visit | Attending: Internal Medicine | Admitting: Internal Medicine

## 2012-06-30 DIAGNOSIS — D509 Iron deficiency anemia, unspecified: Secondary | ICD-10-CM | POA: Insufficient documentation

## 2012-06-30 DIAGNOSIS — Y831 Surgical operation with implant of artificial internal device as the cause of abnormal reaction of the patient, or of later complication, without mention of misadventure at the time of the procedure: Secondary | ICD-10-CM | POA: Insufficient documentation

## 2012-06-30 DIAGNOSIS — I10 Essential (primary) hypertension: Secondary | ICD-10-CM | POA: Insufficient documentation

## 2012-06-30 DIAGNOSIS — K219 Gastro-esophageal reflux disease without esophagitis: Secondary | ICD-10-CM | POA: Insufficient documentation

## 2012-06-30 DIAGNOSIS — I5023 Acute on chronic systolic (congestive) heart failure: Secondary | ICD-10-CM | POA: Insufficient documentation

## 2012-06-30 DIAGNOSIS — J810 Acute pulmonary edema: Secondary | ICD-10-CM | POA: Insufficient documentation

## 2012-06-30 DIAGNOSIS — I428 Other cardiomyopathies: Secondary | ICD-10-CM | POA: Insufficient documentation

## 2012-06-30 DIAGNOSIS — T82190A Other mechanical complication of cardiac electrode, initial encounter: Secondary | ICD-10-CM

## 2012-06-30 DIAGNOSIS — I509 Heart failure, unspecified: Secondary | ICD-10-CM | POA: Insufficient documentation

## 2012-06-30 DIAGNOSIS — Z79899 Other long term (current) drug therapy: Secondary | ICD-10-CM | POA: Insufficient documentation

## 2012-06-30 DIAGNOSIS — I447 Left bundle-branch block, unspecified: Secondary | ICD-10-CM | POA: Insufficient documentation

## 2012-06-30 DIAGNOSIS — I442 Atrioventricular block, complete: Secondary | ICD-10-CM | POA: Insufficient documentation

## 2012-06-30 DIAGNOSIS — I2510 Atherosclerotic heart disease of native coronary artery without angina pectoris: Secondary | ICD-10-CM | POA: Insufficient documentation

## 2012-06-30 DIAGNOSIS — F3289 Other specified depressive episodes: Secondary | ICD-10-CM | POA: Insufficient documentation

## 2012-06-30 DIAGNOSIS — F329 Major depressive disorder, single episode, unspecified: Secondary | ICD-10-CM | POA: Insufficient documentation

## 2012-06-30 DIAGNOSIS — I4891 Unspecified atrial fibrillation: Secondary | ICD-10-CM | POA: Insufficient documentation

## 2012-06-30 DIAGNOSIS — I495 Sick sinus syndrome: Secondary | ICD-10-CM

## 2012-06-30 HISTORY — PX: LEAD REVISION: SHX5945

## 2012-06-30 LAB — SURGICAL PCR SCREEN
MRSA, PCR: NEGATIVE
Staphylococcus aureus: NEGATIVE

## 2012-06-30 SURGERY — LEAD REVISION
Anesthesia: LOCAL

## 2012-06-30 MED ORDER — SODIUM CHLORIDE 0.9 % IV SOLN
INTRAVENOUS | Status: DC
  Administered 2012-06-30: 11:00:00 via INTRAVENOUS

## 2012-06-30 MED ORDER — ONDANSETRON HCL 4 MG/2ML IJ SOLN: 4.0000 mg | Freq: Four times a day (QID) | INTRAMUSCULAR | Status: DC | PRN

## 2012-06-30 MED ORDER — PANTOPRAZOLE SODIUM 20 MG PO TBEC
20.0000 mg | DELAYED_RELEASE_TABLET | Freq: Every day | ORAL | Status: DC
  Administered 2012-07-01: 20 mg via ORAL
  Filled 2012-06-30: qty 1

## 2012-06-30 MED ORDER — ACETAMINOPHEN 325 MG PO TABS
650.0000 mg | ORAL_TABLET | Freq: Four times a day (QID) | ORAL | Status: AC | PRN
  Filled 2012-06-30 (×2): qty 2

## 2012-06-30 MED ORDER — FUROSEMIDE 10 MG/ML IJ SOLN
INTRAMUSCULAR | Status: AC
  Administered 2012-07-01: 40 mg via INTRAVENOUS
  Filled 2012-06-30: qty 8

## 2012-06-30 MED ORDER — IRBESARTAN 150 MG PO TABS
150.0000 mg | ORAL_TABLET | Freq: Every morning | ORAL | Status: DC
  Administered 2012-07-01: 150 mg via ORAL
  Filled 2012-06-30: qty 1

## 2012-06-30 MED ORDER — FENTANYL CITRATE 0.05 MG/ML IJ SOLN
INTRAMUSCULAR | Status: AC
  Filled 2012-06-30: qty 2

## 2012-06-30 MED ORDER — CYCLOBENZAPRINE HCL 5 MG PO TABS
5.0000 mg | ORAL_TABLET | Freq: Every day | ORAL | Status: DC
  Administered 2012-06-30: 5 mg via ORAL
  Filled 2012-06-30 (×2): qty 1

## 2012-06-30 MED ORDER — TRAMADOL HCL 50 MG PO TABS
50.0000 mg | ORAL_TABLET | Freq: Every day | ORAL | Status: DC
  Administered 2012-06-30 – 2012-07-01 (×2): 50 mg via ORAL
  Filled 2012-06-30 (×2): qty 1

## 2012-06-30 MED ORDER — CARVEDILOL 25 MG PO TABS
25.0000 mg | ORAL_TABLET | Freq: Two times a day (BID) | ORAL | Status: DC
  Administered 2012-06-30 – 2012-07-01 (×3): 25 mg via ORAL
  Filled 2012-06-30 (×4): qty 1

## 2012-06-30 MED ORDER — IPRATROPIUM-ALBUTEROL 20-100 MCG/ACT IN AERS
1.0000 | INHALATION_SPRAY | Freq: Four times a day (QID) | RESPIRATORY_TRACT | Status: DC | PRN
  Filled 2012-06-30: qty 4

## 2012-06-30 MED ORDER — METOPROLOL TARTRATE 1 MG/ML IV SOLN
INTRAVENOUS | Status: AC
  Filled 2012-06-30: qty 5

## 2012-06-30 MED ORDER — SODIUM CHLORIDE 0.9 % IR SOLN
80.0000 mg | Status: DC
  Filled 2012-06-30: qty 2

## 2012-06-30 MED ORDER — MIDAZOLAM HCL 5 MG/5ML IJ SOLN
INTRAMUSCULAR | Status: AC
  Filled 2012-06-30: qty 10

## 2012-06-30 MED ORDER — ASPIRIN EC 81 MG PO TBEC
81.0000 mg | DELAYED_RELEASE_TABLET | Freq: Every morning | ORAL | Status: DC
  Administered 2012-07-01: 81 mg via ORAL
  Filled 2012-06-30: qty 1

## 2012-06-30 MED ORDER — ACETAMINOPHEN 325 MG PO TABS
325.0000 mg | ORAL_TABLET | ORAL | Status: DC | PRN
  Administered 2012-06-30 – 2012-07-01 (×3): 650 mg via ORAL
  Filled 2012-06-30 (×3): qty 2

## 2012-06-30 MED ORDER — ACETAMINOPHEN 325 MG PO TABS
ORAL_TABLET | ORAL | Status: AC
  Administered 2012-06-30: 650 mg via ORAL
  Filled 2012-06-30: qty 2

## 2012-06-30 MED ORDER — TIOTROPIUM BROMIDE MONOHYDRATE 18 MCG IN CAPS
18.0000 ug | ORAL_CAPSULE | Freq: Every day | RESPIRATORY_TRACT | Status: DC
  Administered 2012-07-01: 18 ug via RESPIRATORY_TRACT
  Filled 2012-06-30: qty 5

## 2012-06-30 MED ORDER — CEFAZOLIN SODIUM-DEXTROSE 2-3 GM-% IV SOLR
2.0000 g | INTRAVENOUS | Status: DC
  Filled 2012-06-30 (×2): qty 50

## 2012-06-30 MED ORDER — POTASSIUM CHLORIDE ER 10 MEQ PO TBCR
10.0000 meq | EXTENDED_RELEASE_TABLET | Freq: Two times a day (BID) | ORAL | Status: DC
  Administered 2012-06-30 – 2012-07-01 (×2): 10 meq via ORAL
  Filled 2012-06-30 (×3): qty 1

## 2012-06-30 MED ORDER — DULOXETINE HCL 30 MG PO CPEP
30.0000 mg | ORAL_CAPSULE | Freq: Every morning | ORAL | Status: DC
  Administered 2012-07-01: 30 mg via ORAL
  Filled 2012-06-30: qty 1

## 2012-06-30 MED ORDER — MUPIROCIN 2 % EX OINT
TOPICAL_OINTMENT | CUTANEOUS | Status: AC
  Administered 2012-06-30: 1 via NASAL
  Filled 2012-06-30: qty 22

## 2012-06-30 MED ORDER — CEFAZOLIN SODIUM-DEXTROSE 2-3 GM-% IV SOLR
2.0000 g | Freq: Four times a day (QID) | INTRAVENOUS | Status: AC
  Administered 2012-06-30 – 2012-07-01 (×2): 2 g via INTRAVENOUS
  Filled 2012-06-30 (×2): qty 50

## 2012-06-30 MED ORDER — CEFAZOLIN SODIUM 1-5 GM-% IV SOLN
INTRAVENOUS | Status: AC
  Filled 2012-06-30: qty 50

## 2012-06-30 MED ORDER — ATORVASTATIN CALCIUM 20 MG PO TABS
20.0000 mg | ORAL_TABLET | Freq: Every morning | ORAL | Status: DC
  Administered 2012-07-01: 20 mg via ORAL
  Filled 2012-06-30: qty 1

## 2012-06-30 MED ORDER — FUROSEMIDE 40 MG PO TABS
40.0000 mg | ORAL_TABLET | Freq: Every morning | ORAL | Status: DC
  Filled 2012-06-30: qty 1

## 2012-06-30 MED ORDER — FERROUS SULFATE 325 (65 FE) MG PO TABS
325.0000 mg | ORAL_TABLET | Freq: Three times a day (TID) | ORAL | Status: DC
Start: 2012-06-30 — End: 2012-07-01
  Administered 2012-06-30 – 2012-07-01 (×3): 325 mg via ORAL
  Filled 2012-06-30 (×5): qty 1

## 2012-06-30 MED ORDER — MOMETASONE FURO-FORMOTEROL FUM 100-5 MCG/ACT IN AERO
2.0000 | INHALATION_SPRAY | Freq: Two times a day (BID) | RESPIRATORY_TRACT | Status: DC
  Administered 2012-06-30 – 2012-07-01 (×2): 2 via RESPIRATORY_TRACT
  Filled 2012-06-30: qty 8.8

## 2012-06-30 MED ORDER — FUROSEMIDE 10 MG/ML IJ SOLN
INTRAMUSCULAR | Status: AC
  Filled 2012-06-30: qty 4

## 2012-06-30 MED ORDER — LIDOCAINE HCL (PF) 1 % IJ SOLN
INTRAMUSCULAR | Status: AC
  Filled 2012-06-30: qty 60

## 2012-06-30 MED ORDER — MUPIROCIN 2 % EX OINT
TOPICAL_OINTMENT | Freq: Two times a day (BID) | CUTANEOUS | Status: DC
  Administered 2012-06-30 – 2012-07-01 (×2): via NASAL
  Filled 2012-06-30: qty 22

## 2012-06-30 MED ORDER — CHLORHEXIDINE GLUCONATE 4 % EX LIQD
60.0000 mL | Freq: Once | CUTANEOUS | Status: AC
  Administered 2012-06-30: 4 via TOPICAL

## 2012-06-30 NOTE — Op Note (Signed)
EP Procedure Note  Procedure: LV lead revision Indication:LV lead dislodgement Findings: After informed consent was obtained, the patient was prepped in the usual manner. She developed acute pulmonary edema and was treated with IV lasix and slntg. She was then prepped in the usual manner. 30 cc of lidocaine was infiltrated in the left pectoral region. A 5 cm incision was made and electrocautery utilized to dissect down through the pectoralis major and into pectoral pocket. The old LV lead was disconnected and the secondary LV lead was connected. The pocket was irrigated and electrocautery utilized to achieve hemostasis which was difficult. The pocket was again irrigated and the incision including the pectoralis major was re-approximated and closed with 2-0 and 3-0 vicryl.   Conclusion: successful LV lead revision without immediate complication. Because the patient developed pulmonary edema, she will be admitted for observation.  [name].D.

## 2012-06-30 NOTE — Interval H&P Note (Signed)
History and Physical Interval Note:  06/30/2012 10:19 AM  [name]  has presented today for surgery, with the diagnosis of Bad lead  The various methods of treatment have been discussed with the patient and family. After consideration of risks, benefits and other options for treatment, the patient has consented to  Procedure(s): LEAD REVISION (N/A) as a surgical intervention .  The patient's history has been reviewed, patient examined, no change in status, stable for surgery.  I have reviewed the patient's chart and labs.  Questions were answered to the patient's satisfaction.     Lewayne Bunting

## 2012-06-30 NOTE — H&P (View-Only) (Signed)
HPI [name] returns today for followup. She is a 80-year-old woman with a history of atrial fibrillation, complete heart block, status post biventricular pacemaker upgrade in several months ago. She subsequently developed diaphragmatic stimulation which could not be programmed around. The patient underwent epicardial LV lead placement by Dr. Bartle a couple of months ago. Her procedure was complicated by a left pleural effusion. She is subsequently been hospitalized with heart failure symptoms. She returns today for followup. She denies chest pain but has had peripheral edema and one episode of syncope, as well as worsening class III heart failure symptoms. Her chest x-ray has been reviewed. When I saw her several weeks ago, her pacing thresholds were elevated. No Known Allergies   Current Outpatient Prescriptions  Medication Sig Dispense Refill  . ADVAIR DISKUS 250-50 MCG/DOSE AEPB Inhale 1 puff into the lungs 2 (two) times daily.       . aspirin EC 81 MG tablet Take 81 mg by mouth every morning.       . atorvastatin (LIPITOR) 40 MG tablet Take 20 mg by mouth every morning. Take 1/2 - 1 tab daily      . carvedilol (COREG) 25 MG tablet Take 25 mg by mouth 2 (two) times daily with a meal.        . COMBIVENT RESPIMAT 20-100 MCG/ACT AERS respimat Inhale 1 puff into the lungs every 6 (six) hours as needed for wheezing or shortness of breath.       . cyclobenzaprine (FLEXERIL) 5 MG tablet Take 5 mg by mouth at bedtime. Take 1 tab po q hs      . DULoxetine (CYMBALTA) 30 MG capsule Take 30 mg by mouth every morning.       . ferrous sulfate 325 (65 FE) MG tablet Take 1 tablet (325 mg total) by mouth 3 (three) times daily with meals.  30 tablet  0  . furosemide (LASIX) 80 MG tablet Take 0.5 tablets (40 mg total) by mouth every morning.  30 tablet  9  . irbesartan (AVAPRO) 150 MG tablet Take 150 mg by mouth every morning.      . lansoprazole (PREVACID) 15 MG capsule Take 15 mg by mouth every morning.        . potassium chloride (K-DUR) 10 MEQ tablet Take 1 tablet (10 mEq total) by mouth 2 (two) times daily.  30 tablet  0  . tiotropium (SPIRIVA HANDIHALER) 18 MCG inhalation capsule Place 1 capsule (18 mcg total) into inhaler and inhale daily.  30 capsule  11  . traMADol (ULTRAM) 50 MG tablet One tab daily       No current facility-administered medications for this visit.     Past Medical History  Diagnosis Date  . HTN (hypertension)   . Hernia     Incisional hernia, umbilicus.  . H/O: hysterectomy   . CHB (complete heart block)     s/p pacer ([facility] => GT)  . Atrial fibrillation     on xarleto  . Depression   . Goiter   . DJD (degenerative joint disease)   . GERD (gastroesophageal reflux disease)   . Diverticulosis   . Cardiomyopathy secondary     Myoview 3/13: small inf scar apex to base with mild peri-infarct isch; EF 32%;   LHC 05/12/11: pLAD 20%, pCFX 20%, mid 30%, m+dRCA with mild luminal irregs; EF 35% with mid inf wal and inf-apical HK(no culprit for segmental WMA)  . CAD (coronary artery disease)     nonobs   by LHC 4/13  . Systolic CHF   . Pacemaker     upgrade to BiV PPM 02/19/12 (LV lead off)  . H/O hiatal hernia   . LBBB (left bundle branch block)   . Iron deficiency anemia, unspecified 06/07/2012  . Angiodysplasia of stomach 06/09/2012    ROS:   All systems reviewed and negative except as noted in the HPI.   Past Surgical History  Procedure Laterality Date  . Pacemaker placement  , 2002.  . Cholecystectomy    . Insert / replace / remove pacemaker    . Abdominal hysterectomy    . Tubal ligation    . Small intestine surgery    . Cardiac catheterization    . Epicardial pacing lead placement Left 03/17/2012    Procedure: EPICARDIAL PACING LEAD PLACEMENT;  Surgeon: Bryan K Bartle, MD;  Location: [facility] OR;  Service: Thoracic;  Laterality: Left;  placement of LV epicardial leads  . Esophagogastroduodenoscopy N/A 06/09/2012    Procedure: ESOPHAGOGASTRODUODENOSCOPY (EGD);   Surgeon: Carl E Gessner, MD;  Location: [facility];  Service: Endoscopy;  Laterality: N/A;  . Colonoscopy N/A 06/09/2012    Procedure: COLONOSCOPY;  Surgeon: Carl E Gessner, MD;  Location: [facility];  Service: Endoscopy;  Laterality: N/A;  . Hot hemostasis N/A 06/09/2012    Procedure: HOT HEMOSTASIS (ARGON PLASMA COAGULATION/BICAP);  Surgeon: Carl E Gessner, MD;  Location: [facility];  Service: Endoscopy;  Laterality: N/A;     Family History  Problem Relation Age of Onset  . Adopted: Yes  . Coronary artery disease    . Other      stomach carcinoma  . Stomach cancer Other   . Coronary artery disease Other   . Cancer Mother      History   Social History  . Marital Status: Married    Spouse Name: George    Number of Children: 3  . Years of Education: 14   Occupational History  .     Social History Main Topics  . Smoking status: Former Smoker    Types: Cigarettes    Quit date: 01/21/2009  . Smokeless tobacco: Former User    Quit date: 01/21/2009  . Alcohol Use: No  . Drug Use: No  . Sexually Active: No   Other Topics Concern  . Not on file   Social History Narrative   Marital Status:  Married (George)   Children:  3 (Daughter 1955,  Son 1956, Son 1964)   Living Situation: Lives with spouse in a one-level home with 3 steps to enter.  They moved to [place] from [place] since George is from this area.     Occupation: Retired (LPN)   Education: Associates Degree   Tobacco Use/Exposure: 3 packs per week for > 50 years; She quit smoking in 2010     Alcohol Use: Occasional   Drug Use:  None   Diet:  Healthy   Exercise:  None   Hobbies:  Crossword Puzzles, Knitting, Reading, Sewing                        BP 141/80  Pulse 73  Ht 5' 11" (1.803 m)  Wt 193 lb (87.544 kg)  BMI 26.93 kg/m2  Physical Exam:  Well appearing 80-year-old woman,NAD HEENT: Unremarkable Neck:  7 cm JVD, no thyromegally Back:  No CVA tenderness Lungs:  Clear except for rales in the  bases bilaterally. HEART:  Regular rate rhythm, no murmurs, no rubs, no clicks   Abd:  soft, positive bowel sounds, no organomegally, no rebound, no guarding Ext:  2 plus pulses, no edema, no cyanosis, no clubbing Skin:  No rashes no nodules Neuro:  CN II through XII intact, motor grossly intact  EKG Atrial fibrillation with ventricular pacing DEVICE  Device interrogation today demonstrates that her left ventricular lead is not capturing. Chest x-ray - her chest x-ray from several weeks ago demonstrates that one of her epicardial left ventricular lead had dislodged.    Assess/Plan:LAD 

## 2012-07-01 ENCOUNTER — Encounter (HOSPITAL_COMMUNITY): Payer: Self-pay | Admitting: *Deleted

## 2012-07-01 ENCOUNTER — Observation Stay (HOSPITAL_COMMUNITY): Payer: Medicare Other

## 2012-07-01 LAB — BASIC METABOLIC PANEL
BUN: 16 mg/dL (ref 6–23)
CO2: 28 mEq/L (ref 19–32)
GFR calc non Af Amer: 67 mL/min — ABNORMAL LOW (ref >90)
Glucose, Bld: 96 mg/dL (ref 70–99)
Potassium: 3.8 mEq/L (ref 3.5–5.1)

## 2012-07-01 MED ORDER — FUROSEMIDE 10 MG/ML IJ SOLN
40.0000 mg | Freq: Once | INTRAMUSCULAR | Status: AC
  Filled 2012-07-01: qty 4

## 2012-07-01 NOTE — Discharge Summary (Signed)
ELECTROPHYSIOLOGY DISCHARGE SUMMARY    Patient ID: [name],  MRN: [id number], DOB/AGE: 77-Aug-1934 77 y.o.  Admit date: 06/30/2012 Discharge date: 07/04/2012  Primary Care Physician: [name], MD Primary Cardiologist: Lewayne Bunting, MD  Primary Discharge Diagnosis:  1. LV lead dislodgement s/p LV lead revision 2. Acute on chronic systolic HF  Secondary Discharge Diagnoses:  1. NICM, EF 30% 2. Atrial fibrillation 3. Complete heart block, now with BiV PPM 4. LBBB 5. Nonobstructive CAD 6. HTN 7. Iron deficiency anemia 8. GERD 9. Depression  Procedures This Admission:  1. LV lead revision 06/30/2012 (see dictated operative note for full details) Conclusion: successful LV lead revision without immediate complication. Because the patient developed pulmonary edema, she will be admitted for observation.  History and Hospital Course:  Ms. Turk is 79 year old woman with a NICM, AF and CHB who is s/p biventricular pacemaker upgrade several months ago. She subsequently developed diaphragmatic stimulation which could not be programmed around and then underwent epicardial LV lead placement by Dr. Laneta Simmers a couple of months ago. Her procedure was complicated by a left pleural effusion. In the interim, she has also been hospitalized with heart failure. She returned for follow-up with Dr. Ladona Ridgel on 06/29/2012 at which time her device interrogation revealed loss of LV capture and her CXR confirmed that her epicardial LV lead has dislodged. Therefore, she presented yesterday for LV lead revision. Per dictated operative report, after informed consent was obtained, Ms. Erb was prepped in the usual manner. She developed acute pulmonary edema and was treated with IV lasix and SL NTG. She was stabilized. 30 cc of lidocaine was infiltrated in the left pectoral region. A 5 cm incision was made and electrocautery utilized to dissect down through the pectoralis major and into pectoral pocket. The old  LV lead was disconnected and the secondary LV lead was connected. The pocket was irrigated and electrocautery utilized to achieve hemostasis which was difficult. The pocket was again irrigated and the incision including the pectoralis major was re-approximated and closed with 2-0 and 3-0 vicryl. Ms. Cothern tolerated this procedure well without any immediate complication. Because she experienced acute pulmonary edema she was kept for observation. She remains hemodynamically stable and afebrile. Her chest x-ray has been reviewed by Dr. Ladona Ridgel and shows stable lead placement. Her device interrogation shows normal BiV PPM function with stable lead parameters/measurements. Her implant site is intact without significant bleeding or hematoma. She has been given discharge instructions including wound care and activity restrictions. She will follow-up in 7 days for wound check and repeat labs (BMET). There were no changes made to her medications. She has been seen, examined and deemed stable for discharge today by Dr. Lewayne Bunting.  Discharge Vitals: Blood pressure 148/76, pulse 71, temperature 98.5 F (36.9 C), temperature source Oral, resp. rate 20, height 5\' 11"  (1.803 m), weight 87.544 kg (193 lb), SpO2 98.00%.   Labs: Lab Results  Component Value Date   WBC 5.4 06/29/2012   HGB 11.5* 06/29/2012   HCT 35.2* 06/29/2012   MCV 85.2 06/29/2012   PLT 218.0 06/29/2012     Recent Labs Lab 07/01/12 0730  NA 140  K 3.8  CL 102  CO2 28  BUN 16  CREATININE 0.82  CALCIUM 9.3  GLUCOSE 96   No results found for this basename: INR,  in the last 72 hours  Disposition:  The patient is being discharged in stable condition.  Follow-up:     Follow-up Information   Follow up with  [facility] On 07/07/2012. (At 3:00 PM for wound check and labs (BMET))    Contact information:   [address] [place] [address] [phone]      Follow up with Lewayne Bunting, MD On 08/02/2012. (At  12:00 noon)    Contact information:   [address] [place] [address] [phone]     Discharge Medications:    Medication List    TAKE these medications       ADVAIR DISKUS 250-50 MCG/DOSE Aepb  Generic drug:  Fluticasone-Salmeterol  Inhale 1 puff into the lungs 2 (two) times daily.     aspirin EC 81 MG tablet  Take 81 mg by mouth every morning.     atorvastatin 40 MG tablet  Commonly known as:  LIPITOR  Take 20 mg by mouth every morning. Take 1/2 - 1 tab daily     carvedilol 25 MG tablet  Commonly known as:  COREG  Take 25 mg by mouth 2 (two) times daily with a meal.     COMBIVENT RESPIMAT 20-100 MCG/ACT Aers respimat  Generic drug:  Ipratropium-Albuterol  Inhale 1 puff into the lungs every 6 (six) hours as needed for wheezing or shortness of breath.     cyclobenzaprine 5 MG tablet  Commonly known as:  FLEXERIL  Take 5 mg by mouth at bedtime. Take 1 tab po q hs     DULoxetine 30 MG capsule  Commonly known as:  CYMBALTA  Take 30 mg by mouth every morning.     ferrous sulfate 325 (65 FE) MG tablet  Take 1 tablet (325 mg total) by mouth 3 (three) times daily with meals.     furosemide 80 MG tablet  Commonly known as:  LASIX  Take 0.5 tablets (40 mg total) by mouth every morning.     irbesartan 150 MG tablet  Commonly known as:  AVAPRO  Take 150 mg by mouth every morning.     lansoprazole 15 MG capsule  Commonly known as:  PREVACID  Take 15 mg by mouth every morning.     potassium chloride 10 MEQ tablet  Commonly known as:  K-DUR  Take 1 tablet (10 mEq total) by mouth 2 (two) times daily.     tiotropium 18 MCG inhalation capsule  Commonly known as:  SPIRIVA HANDIHALER  Place 1 capsule (18 mcg total) into inhaler and inhale daily.     traMADol 50 MG tablet  Commonly known as:  ULTRAM  Take 50 mg by mouth daily. One tab daily       Duration of Discharge Encounter: Greater than 30 minutes including physician  time.  Signed, Rick Duff, PA-C 07/04/2012, 8:00 PM

## 2012-07-01 NOTE — Progress Notes (Signed)
Utilization review completed.  

## 2012-07-01 NOTE — Progress Notes (Signed)
Patient ID: [name], female   DOB: Jun 04, 1932, 79 y.o.   MRN: [id number] Subjective:  Dyspnea improved, complains of a headache, mild.  Objective:  Vital Signs in the last 24 hours: Temp:  [97.3 F (36.3 C)-97.7 F (36.5 C)] 97.6 F (36.4 C) (05/23 0459) Pulse Rate:  [71-76] 73 (05/23 0459) Resp:  [18] 18 (05/22 1615) BP: (125-164)/(52-102) 162/52 mmHg (05/23 0459) SpO2:  [93 %-100 %] 100 % (05/23 0459) Weight:  [193 lb (87.544 kg)] 193 lb (87.544 kg) (05/22 0846)  Intake/Output from previous day: 05/22 0701 - 05/23 0700 In: 360 [P.O.:360] Out: 2000 [Urine:2000] Intake/Output from this shift:    Physical Exam: Well appearing NAD HEENT: Unremarkable Neck:  7 cm JVD, no thyromegally Lungs:  Clear with no wheezes, minimal basilar rales. Incision with out hematoma HEART:  Regular rate rhythm, no murmurs, no rubs, no clicks Abd:  Flat, positive bowel sounds, no organomegally, no rebound, no guarding Ext:  2 plus pulses, no edema, no cyanosis, no clubbing Skin:  No rashes no nodules Neuro:  CN II through XII intact, motor grossly intact  Lab Results:  Recent Labs  06/29/12 1540  WBC 5.4  HGB 11.5*  PLT 218.0    Recent Labs  06/29/12 1540  NA 141  K 4.3  CL 105  CO2 30  GLUCOSE 84  BUN 20  CREATININE 1.1   No results found for this basename: TROPONINI, CK, MB,  in the last 72 hours Hepatic Function Panel No results found for this basename: PROT, ALBUMIN, AST, ALT, ALKPHOS, BILITOT, BILIDIR, IBILI,  in the last 72 hours No results found for this basename: CHOL,  in the last 72 hours No results found for this basename: PROTIME,  in the last 72 hours  Imaging: Dg Chest 2 View  07/01/2012   *RADIOLOGY REPORT*  Clinical Data: Post left ventricular lead revision, shortness of breath, weakness  CHEST - 2 VIEW  Comparison: Chest x-ray of 06/06/2012  Findings: There is no more opacity at the left lung base consistent with left pleural effusion and left basilar  atelectasis.  Pneumonia cannot be excluded.  The right lung is clear.  Cardiomegaly is stable.  Permanent pacemaker remains and epicardial leads are present as well.  One epicardial lead does appear to have changed position.  IMPRESSION:  1.  Increased opacity at the left lung base consistent with left pleural effusion and left basilar atelectasis.  Cannot exclude pneumonia. 2.  Stable cardiomegaly with pacer. 3.  One of the epicardial leads has changed position in the interval.   Original Report Authenticated By: Dwyane Dee, M.D.    Cardiac Studies: Tele - nsr with biv pacing Assessment/Plan:  1. Acute systolic CHF prior to PM revision, treated with IV lasix and slntg, now improved.  2. PPM LV lead dysfunction, s/p PPM lead revision (epicardial lead had dislodged and second epicardial lead connected to BiV PM) Rec: will check labs, give an additional dose of IV lasix, and plan to discharge home this p.m.  LOS: 1 day    Cheral Cappucci,M.D. 07/01/2012, 8:04 AM

## 2012-07-07 ENCOUNTER — Ambulatory Visit (INDEPENDENT_AMBULATORY_CARE_PROVIDER_SITE_OTHER): Payer: Medicare Other | Admitting: *Deleted

## 2012-07-07 DIAGNOSIS — I429 Cardiomyopathy, unspecified: Secondary | ICD-10-CM

## 2012-07-07 LAB — PACEMAKER DEVICE OBSERVATION
DEVICE MODEL PM: 2874471
RV LEAD AMPLITUDE: 12 mv
VENTRICULAR PACING PM: 99

## 2012-07-07 NOTE — Progress Notes (Signed)
Wound check ppm in clinic.  

## 2012-07-12 ENCOUNTER — Encounter: Payer: Medicare Other | Admitting: Internal Medicine

## 2012-08-02 ENCOUNTER — Encounter: Payer: Medicare Other | Admitting: Internal Medicine

## 2012-08-26 ENCOUNTER — Encounter: Payer: Self-pay | Admitting: Internal Medicine

## 2012-08-26 ENCOUNTER — Telehealth: Payer: Self-pay | Admitting: Internal Medicine

## 2012-08-26 ENCOUNTER — Inpatient Hospital Stay (HOSPITAL_COMMUNITY)
Admission: EM | Admit: 2012-08-26 | Discharge: 2012-08-31 | DRG: 292 | Disposition: A | Discharge status: Home / Self Care | Payer: Medicare Other | Attending: Internal Medicine | Admitting: Internal Medicine

## 2012-08-26 ENCOUNTER — Emergency Department (HOSPITAL_COMMUNITY): Payer: Medicare Other

## 2012-08-26 ENCOUNTER — Encounter (HOSPITAL_COMMUNITY): Payer: Self-pay | Admitting: *Deleted

## 2012-08-26 DIAGNOSIS — I447 Left bundle-branch block, unspecified: Secondary | ICD-10-CM | POA: Diagnosis present

## 2012-08-26 DIAGNOSIS — Z66 Do not resuscitate: Secondary | ICD-10-CM | POA: Diagnosis present

## 2012-08-26 DIAGNOSIS — I4891 Unspecified atrial fibrillation: Secondary | ICD-10-CM | POA: Diagnosis present

## 2012-08-26 DIAGNOSIS — M1990 Unspecified osteoarthritis, unspecified site: Secondary | ICD-10-CM | POA: Diagnosis present

## 2012-08-26 DIAGNOSIS — F0390 Unspecified dementia without behavioral disturbance: Secondary | ICD-10-CM | POA: Diagnosis present

## 2012-08-26 DIAGNOSIS — T82190A Other mechanical complication of cardiac electrode, initial encounter: Secondary | ICD-10-CM | POA: Diagnosis not present

## 2012-08-26 DIAGNOSIS — I429 Cardiomyopathy, unspecified: Secondary | ICD-10-CM

## 2012-08-26 DIAGNOSIS — I5022 Chronic systolic (congestive) heart failure: Secondary | ICD-10-CM | POA: Diagnosis present

## 2012-08-26 DIAGNOSIS — I495 Sick sinus syndrome: Secondary | ICD-10-CM | POA: Diagnosis present

## 2012-08-26 DIAGNOSIS — I428 Other cardiomyopathies: Secondary | ICD-10-CM | POA: Diagnosis present

## 2012-08-26 DIAGNOSIS — I2510 Atherosclerotic heart disease of native coronary artery without angina pectoris: Secondary | ICD-10-CM | POA: Diagnosis present

## 2012-08-26 DIAGNOSIS — F329 Major depressive disorder, single episode, unspecified: Secondary | ICD-10-CM | POA: Diagnosis present

## 2012-08-26 DIAGNOSIS — Y831 Surgical operation with implant of artificial internal device as the cause of abnormal reaction of the patient, or of later complication, without mention of misadventure at the time of the procedure: Secondary | ICD-10-CM | POA: Diagnosis not present

## 2012-08-26 DIAGNOSIS — I5023 Acute on chronic systolic (congestive) heart failure: Principal | ICD-10-CM | POA: Diagnosis present

## 2012-08-26 DIAGNOSIS — F3289 Other specified depressive episodes: Secondary | ICD-10-CM | POA: Diagnosis present

## 2012-08-26 DIAGNOSIS — I10 Essential (primary) hypertension: Secondary | ICD-10-CM | POA: Diagnosis present

## 2012-08-26 DIAGNOSIS — I509 Heart failure, unspecified: Secondary | ICD-10-CM

## 2012-08-26 DIAGNOSIS — R0609 Other forms of dyspnea: Secondary | ICD-10-CM | POA: Diagnosis present

## 2012-08-26 DIAGNOSIS — D649 Anemia, unspecified: Secondary | ICD-10-CM

## 2012-08-26 DIAGNOSIS — K219 Gastro-esophageal reflux disease without esophagitis: Secondary | ICD-10-CM | POA: Diagnosis present

## 2012-08-26 DIAGNOSIS — Z950 Presence of cardiac pacemaker: Secondary | ICD-10-CM | POA: Diagnosis present

## 2012-08-26 DIAGNOSIS — E876 Hypokalemia: Secondary | ICD-10-CM | POA: Diagnosis present

## 2012-08-26 DIAGNOSIS — J449 Chronic obstructive pulmonary disease, unspecified: Secondary | ICD-10-CM | POA: Diagnosis present

## 2012-08-26 DIAGNOSIS — Z87891 Personal history of nicotine dependence: Secondary | ICD-10-CM

## 2012-08-26 DIAGNOSIS — R600 Localized edema: Secondary | ICD-10-CM | POA: Diagnosis present

## 2012-08-26 DIAGNOSIS — R609 Edema, unspecified: Secondary | ICD-10-CM

## 2012-08-26 DIAGNOSIS — J4489 Other specified chronic obstructive pulmonary disease: Secondary | ICD-10-CM | POA: Diagnosis present

## 2012-08-26 DIAGNOSIS — F09 Unspecified mental disorder due to known physiological condition: Secondary | ICD-10-CM | POA: Diagnosis present

## 2012-08-26 DIAGNOSIS — D509 Iron deficiency anemia, unspecified: Secondary | ICD-10-CM | POA: Diagnosis present

## 2012-08-26 LAB — CBC
HCT: 37.9 % (ref 36.0–46.0)
Hemoglobin: 12.7 g/dL (ref 12.0–15.0)
MCH: 30.6 pg (ref 26.0–34.0)
MCV: 91.3 fL (ref 78.0–100.0)
RBC: 4.15 MIL/uL (ref 3.87–5.11)
WBC: 5.7 10*3/uL (ref 4.0–10.5)

## 2012-08-26 LAB — BASIC METABOLIC PANEL
BUN: 13 mg/dL (ref 6–23)
CO2: 28 mEq/L (ref 19–32)
Calcium: 10 mg/dL (ref 8.4–10.5)
Chloride: 105 mEq/L (ref 96–112)
Creatinine, Ser: 0.96 mg/dL (ref 0.50–1.10)
Glucose, Bld: 120 mg/dL — ABNORMAL HIGH (ref 70–99)

## 2012-08-26 LAB — TROPONIN I: Troponin I: <0.3 ng/mL (ref <0.30)

## 2012-08-26 LAB — PRO B NATRIURETIC PEPTIDE: Pro B Natriuretic peptide (BNP): 12238 pg/mL — ABNORMAL HIGH (ref 0–450)

## 2012-08-26 LAB — POCT I-STAT TROPONIN I

## 2012-08-26 MED ORDER — POTASSIUM CHLORIDE CRYS ER 10 MEQ PO TBCR
30.0000 meq | EXTENDED_RELEASE_TABLET | Freq: Once | ORAL | Status: AC
  Administered 2012-08-26: 30 meq via ORAL
  Filled 2012-08-26: qty 1

## 2012-08-26 MED ORDER — IPRATROPIUM-ALBUTEROL 20-100 MCG/ACT IN AERS
1.0000 | INHALATION_SPRAY | Freq: Four times a day (QID) | RESPIRATORY_TRACT | Status: DC | PRN
  Filled 2012-08-26: qty 4

## 2012-08-26 MED ORDER — FUROSEMIDE 10 MG/ML IJ SOLN
80.0000 mg | Freq: Two times a day (BID) | INTRAMUSCULAR | Status: DC
  Administered 2012-08-27 – 2012-08-29 (×5): 80 mg via INTRAVENOUS
  Filled 2012-08-26 (×7): qty 8

## 2012-08-26 MED ORDER — ACETAMINOPHEN 325 MG PO TABS
650.0000 mg | ORAL_TABLET | ORAL | Status: DC | PRN
  Administered 2012-08-27 – 2012-08-31 (×7): 650 mg via ORAL
  Filled 2012-08-26 (×8): qty 2

## 2012-08-26 MED ORDER — IRBESARTAN 150 MG PO TABS
150.0000 mg | ORAL_TABLET | Freq: Every morning | ORAL | Status: DC
  Administered 2012-08-27: 150 mg via ORAL
  Filled 2012-08-26: qty 1

## 2012-08-26 MED ORDER — CARVEDILOL 25 MG PO TABS
25.0000 mg | ORAL_TABLET | Freq: Two times a day (BID) | ORAL | Status: DC
  Administered 2012-08-27 – 2012-08-31 (×10): 25 mg via ORAL
  Filled 2012-08-26 (×12): qty 1

## 2012-08-26 MED ORDER — SODIUM CHLORIDE 0.9 % IJ SOLN
3.0000 mL | Freq: Two times a day (BID) | INTRAMUSCULAR | Status: DC
  Administered 2012-08-26 – 2012-08-31 (×10): 3 mL via INTRAVENOUS

## 2012-08-26 MED ORDER — ONDANSETRON HCL 4 MG/2ML IJ SOLN
4.0000 mg | Freq: Four times a day (QID) | INTRAMUSCULAR | Status: DC | PRN
  Administered 2012-08-29: 4 mg via INTRAVENOUS
  Filled 2012-08-26: qty 2

## 2012-08-26 MED ORDER — CYCLOBENZAPRINE HCL 5 MG PO TABS
5.0000 mg | ORAL_TABLET | Freq: Every day | ORAL | Status: DC
  Administered 2012-08-26 – 2012-08-30 (×5): 5 mg via ORAL
  Filled 2012-08-26 (×6): qty 1

## 2012-08-26 MED ORDER — DULOXETINE HCL 30 MG PO CPEP
30.0000 mg | ORAL_CAPSULE | Freq: Every morning | ORAL | Status: DC
  Administered 2012-08-27 – 2012-08-31 (×5): 30 mg via ORAL
  Filled 2012-08-26 (×5): qty 1

## 2012-08-26 MED ORDER — HEPARIN SODIUM (PORCINE) 5000 UNIT/ML IJ SOLN
5000.0000 [IU] | Freq: Three times a day (TID) | INTRAMUSCULAR | Status: DC
  Administered 2012-08-26 – 2012-08-31 (×15): 5000 [IU] via SUBCUTANEOUS
  Filled 2012-08-26 (×18): qty 1

## 2012-08-26 MED ORDER — PANTOPRAZOLE SODIUM 20 MG PO TBEC
20.0000 mg | DELAYED_RELEASE_TABLET | Freq: Every day | ORAL | Status: DC
  Administered 2012-08-26 – 2012-08-31 (×6): 20 mg via ORAL
  Filled 2012-08-26 (×6): qty 1

## 2012-08-26 MED ORDER — ASPIRIN EC 81 MG PO TBEC
81.0000 mg | DELAYED_RELEASE_TABLET | Freq: Every morning | ORAL | Status: DC
  Administered 2012-08-27 – 2012-08-31 (×5): 81 mg via ORAL
  Filled 2012-08-26 (×5): qty 1

## 2012-08-26 MED ORDER — TIOTROPIUM BROMIDE MONOHYDRATE 18 MCG IN CAPS
18.0000 ug | ORAL_CAPSULE | Freq: Every day | RESPIRATORY_TRACT | Status: DC
  Administered 2012-08-27 – 2012-08-30 (×4): 18 ug via RESPIRATORY_TRACT
  Filled 2012-08-26: qty 5

## 2012-08-26 MED ORDER — SODIUM CHLORIDE 0.9 % IV SOLN: 250.0000 mL | INTRAVENOUS | Status: DC | PRN

## 2012-08-26 MED ORDER — FUROSEMIDE 10 MG/ML IJ SOLN
80.0000 mg | Freq: Once | INTRAMUSCULAR | Status: AC
  Administered 2012-08-26: 80 mg via INTRAVENOUS
  Filled 2012-08-26: qty 8

## 2012-08-26 MED ORDER — MOMETASONE FURO-FORMOTEROL FUM 100-5 MCG/ACT IN AERO
2.0000 | INHALATION_SPRAY | Freq: Two times a day (BID) | RESPIRATORY_TRACT | Status: DC
  Administered 2012-08-27 – 2012-08-30 (×8): 2 via RESPIRATORY_TRACT
  Filled 2012-08-26: qty 8.8

## 2012-08-26 MED ORDER — SODIUM CHLORIDE 0.9 % IJ SOLN: 3.0000 mL | INTRAMUSCULAR | Status: DC | PRN

## 2012-08-26 MED ORDER — ATORVASTATIN CALCIUM 20 MG PO TABS
20.0000 mg | ORAL_TABLET | Freq: Every day | ORAL | Status: DC
  Administered 2012-08-27 – 2012-08-30 (×4): 20 mg via ORAL
  Filled 2012-08-26 (×5): qty 1

## 2012-08-26 NOTE — H&P (Addendum)
[facility] History and Physical  Edin Skarda ZOX:[id number] DOB: 08-Aug-1932 DOA: 08/26/2012  Referring physician: Dr. Radford Pax PCP: [name], MD  Specialists: Cardiology  Chief Complaint: Shortness of breath  HPI: [name] is a 79 y.o. female has a past medical history significant for nonischemic cardiomyopathy with systolic heart failure (last 2-D echo April of 2014 showed an ejection fraction of 45%), atrial fibrillation status post biventricular pacer, hypertension, presents with a chief complaint of shortness of breath, worsening for the past 2 days. He also has noticed worsening of orthopnea as well as bilateral ankle swelling. She denies frank weight gain, but she's not sure. Her husband, who is in the room, also endorses at this point patient was confused and he reports that that she may have had couple episodes of hallucinations. She have had these in the past. She denies chest pain, abdominal pain, nausea, vomiting, fever or chills. She had a recent hospitalization one half months ago for a GI bleed, and at that time her Xarelto was discontinued. She is also complaining today of a "growth" on the left shoulder that has been there for the past few months. In the ED,blood work was significant for elevated BNP, mild hypokalemia And significantly fluid overloaded on physical exam. [facility] was asked by the ED physician to admit. Cardiology has been consulted by the ED physician.  Review of Systems: As per history of present illness, otherwise negative  Past Medical History  Diagnosis Date  . HTN (hypertension)   . Hernia     Incisional hernia, umbilicus.  . H/O: hysterectomy   . CHB (complete heart block)     s/p pacer (Tenant => GT)  . Atrial fibrillation     on xarleto  . Depression   . Goiter   . DJD (degenerative joint disease)   . GERD (gastroesophageal reflux disease)   . Diverticulosis   . Cardiomyopathy secondary     Myoview 3/13: small inf scar apex  to base with mild peri-infarct isch; EF 32%;   LHC 05/12/11: pLAD 20%, pCFX 20%, mid 30%, m+dRCA with mild luminal irregs; EF 35% with mid inf wal and inf-apical HK(no culprit for segmental WMA)  . CAD (coronary artery disease)     nonobs by [facility] 4/13  . Systolic CHF   . Pacemaker     upgrade to BiV PPM 02/19/12 (LV lead off)  . H/O hiatal hernia   . LBBB (left bundle branch block)   . Iron deficiency anemia, unspecified 06/07/2012  . Angiodysplasia of stomach 06/09/2012   Past Surgical History  Procedure Laterality Date  . Pacemaker placement  , 2002.  [name] Cholecystectomy    . Insert / replace / remove pacemaker    . Abdominal hysterectomy    . Tubal ligation    . Small intestine surgery    . Cardiac catheterization    . Epicardial pacing lead placement Left 03/17/2012    Procedure: EPICARDIAL PACING LEAD PLACEMENT;  Surgeon: [name], MD;  Location: [facility] OR;  Service: Thoracic;  Laterality: Left;  placement of LV epicardial leads  . Esophagogastroduodenoscopy N/A 06/09/2012    Procedure: ESOPHAGOGASTRODUODENOSCOPY (EGD);  Surgeon: Iva Boop, MD;  Location: [facility];  Service: Endoscopy;  Laterality: N/A;  . Colonoscopy N/A 06/09/2012    Procedure: COLONOSCOPY;  Surgeon: Iva Boop, MD;  Location: [facility];  Service: Endoscopy;  Laterality: N/A;  . Hot hemostasis N/A 06/09/2012    Procedure: HOT HEMOSTASIS (ARGON PLASMA COAGULATION/BICAP);  Surgeon: Baldo Ash  Sena Slate, MD;  Location: [facility];  Service: Endoscopy;  Laterality: N/A;   Social History:  reports that she quit smoking about 3 years ago. Her smoking use included Cigarettes. She smoked 0.00 packs per day. She quit smokeless tobacco use about 3 years ago. She reports that she does not drink alcohol or use illicit drugs.  No Known Allergies  Family History  Problem Relation Age of Onset  . Adopted: Yes  . Coronary artery disease    . Other      stomach carcinoma  . Stomach cancer Other   . Coronary artery disease  Other   . Cancer Mother    Prior to Admission medications   Medication Sig Start Date End Date Taking? Authorizing Provider  aspirin EC 81 MG tablet Take 81 mg by mouth every morning.  05/25/11  Yes Scott Moishe Spice, PA-C  atorvastatin (LIPITOR) 40 MG tablet Take 20 mg by mouth every morning. Take 1/2 - 1 tab daily 05/26/12 05/26/13 Yes Gillian Scarce, MD  carvedilol (COREG) 25 MG tablet Take 25 mg by mouth 2 (two) times daily with a meal.     Yes Historical Provider, MD  cyclobenzaprine (FLEXERIL) 5 MG tablet Take 5 mg by mouth at bedtime. Take 1 tab po q hs 05/26/12 05/26/13 Yes Gillian Scarce, MD  DULoxetine (CYMBALTA) 30 MG capsule Take 30 mg by mouth every morning.  04/14/12  Yes Historical Provider, MD  furosemide (LASIX) 80 MG tablet Take 0.5 tablets (40 mg total) by mouth every morning. 06/09/12  Yes Adeline C Viyuoh, MD  irbesartan (AVAPRO) 150 MG tablet Take 150 mg by mouth every morning. 05/26/12 05/26/13 Yes Gillian Scarce, MD  lansoprazole (PREVACID) 15 MG capsule Take 15 mg by mouth every morning.    Yes Historical Provider, MD  potassium chloride (K-DUR) 10 MEQ tablet Take 1 tablet (10 mEq total) by mouth 2 (two) times daily. 01/25/12  Yes Belkys A Regalado, MD  traMADol (ULTRAM) 50 MG tablet Take 50 mg by mouth daily. One tab daily 06/03/12  Yes Historical Provider, MD  ADVAIR DISKUS 250-50 MCG/DOSE AEPB Inhale 1 puff into the lungs 2 (two) times daily.  04/02/12   Historical Provider, MD  COMBIVENT RESPIMAT 20-100 MCG/ACT AERS respimat Inhale 1 puff into the lungs every 6 (six) hours as needed for wheezing or shortness of breath.  04/02/12   Historical Provider, MD  tiotropium (SPIRIVA HANDIHALER) 18 MCG inhalation capsule Place 1 capsule (18 mcg total) into inhaler and inhale daily. 05/26/12 05/26/13  Gillian Scarce, MD   Physical Exam: Filed Vitals:   08/26/12 1430 08/26/12 1615 08/26/12 1630 08/26/12 1700  BP: 164/102   175/95  Pulse: 71 71 63 70  Temp: 98.2 F (36.8 C)     TempSrc: Oral      Resp: 18 17 19 13   SpO2: 95% 98% 100% 99%     General:  No apparent distress,   Eyes: PERRL, EOMI, no scleral icterus  ENT: moist oropharynx  Neck: supple, JVD present   Cardiovascular: regular rate and rhythm; 2+ peripheral pulses  Respiratory:  mild bilateral crackles with minimal wheezing.   Abdomen: soft, non tender to palpation, positive bowel sounds, no guarding, no rebound  Skin: no rashes  Musculoskeletal: 2+ peripheral edema; 2 x 2 centimeter firm, nontender, mobile mass on the superior aspect of the shoulder.   Psychiatric: normal mood and affect  Neurologic: Grossly nonfocal.  Labs on Admission:  Basic Metabolic Panel:  Recent  Labs Lab 08/26/12 1442  NA 142  K 3.4*  CL 105  CO2 28  GLUCOSE 120*  BUN 13  CREATININE 0.96  CALCIUM 10.0   CBC:  Recent Labs Lab 08/26/12 1442  WBC 5.7  HGB 12.7  HCT 37.9  MCV 91.3  PLT 170   BNP (last 3 results)  Recent Labs  01/24/12 0550 03/12/12 0945 08/26/12 1442  PROBNP 2122.0* 5680.0* 12238.0*   Radiological Exams on Admission: Dg Chest 2 View  08/26/2012   *RADIOLOGY REPORT*  Clinical Data: Shortness of breath  CHEST - 2 VIEW  Comparison: 07/01/2012  Findings: Chronic interstitial markings.  No frank interstitial edema.  Left basilar opacity, likely reflecting a combination of small pleural effusion and lower lobe atelectasis, mildly improved.  No pneumothorax.  Cardiomegaly.  Left subclavian pacemaker.  Degenerative changes of the visualized thoracolumbar spine.  IMPRESSION: Small right pleural effusion with associated lower lobe atelectasis, improved.   Original Report Authenticated By: Charline Bills, M.D.    EKG: Independently reviewed.  Assessment/Plan Active Problems:   Essential hypertension, benign   Sinoatrial node dysfunction   PPM-St.Jude   Atrial fibrillation   CAD (coronary artery disease)   Nonischemic cardiomyopathy   Chronic systolic heart failure   Dyspnea on exertion    Lower extremity edema   Acute on chronic systolic CHF (congestive heart failure)   Acute on chronic decompensated systolic heart failure - Appreciate cardiology input, IV Lasix 80 BID - Will cycle cardiac enzymes x3. - Admit on telemetry, strict I.'s and O.'s, daily weights. - Husband reports patient somewhat dyspneic at home a lot at baseline even. After diuresis she may benefit for evaluation whether she qualifies for oxygen at home. Hypertension - Restart home medications Hypokalemia - borderline, replete today and recheck BMP in am. Coronary artery disease - Continue medications, stable Atrial fibrillation - Status post pacemaker. EKG shows regular paced complexes Recent history of GI bleed - Hemoglobin within normal limits at 12.7; no bleeding reported. Left shoulder mass - likely lipoma, may be followed up as an outpatient Confusion - Perhaps due to poor oxygenation status at home, she is not on oxygen at home. - Check TSH. - check urinalysis DVT prophylaxis - Heparin subcutaneous  Code Status: DNR  Family Communication: husband bedside  Disposition Plan: inpatient  Time spent: 47  Costin M. Elvera Lennox, MD [facility] Pager [phone]  If 7PM-7AM, please contact night-coverage [email or web address] Password [email or web address] 08/26/2012, 5:44 PM

## 2012-08-26 NOTE — Telephone Encounter (Signed)
New Prob     States pt is having SOB. Pt feels she may need some oxygen. Would like to speak to nurse regarding this.

## 2012-08-26 NOTE — ED Provider Notes (Signed)
History    CSN: [id number] Arrival date & time 08/26/12  1424  First MD Initiated Contact with Patient 08/26/12 1536     Chief Complaint  Patient presents with  . Shortness of Breath    HPI Pt and family reports pt has hx of chf and having sob and swelling to ankles. Denies any cp.  Has had increasing shortness of breath.  Denies fever chills nausea vomiting. Past Medical History  Diagnosis Date  . HTN (hypertension)   . Hernia     Incisional hernia, umbilicus.  . H/O: hysterectomy   . CHB (complete heart block)     s/p pacer (Tenant => GT)  . Atrial fibrillation     on xarleto  . Depression   . Goiter   . DJD (degenerative joint disease)   . GERD (gastroesophageal reflux disease)   . Diverticulosis   . Cardiomyopathy secondary     Myoview 3/13: small inf scar apex to base with mild peri-infarct isch; EF 32%;   LHC 05/12/11: pLAD 20%, pCFX 20%, mid 30%, m+dRCA with mild luminal irregs; EF 35% with mid inf wal and inf-apical HK(no culprit for segmental WMA)  . CAD (coronary artery disease)     nonobs by [facility] 4/13  . Systolic CHF   . Pacemaker     upgrade to BiV PPM 02/19/12 (LV lead off)  . H/O hiatal hernia   . LBBB (left bundle branch block)   . Iron deficiency anemia, unspecified 06/07/2012  . Angiodysplasia of stomach 06/09/2012   Past Surgical History  Procedure Laterality Date  . Pacemaker placement  , 2002.  [name] Cholecystectomy    . Insert / replace / remove pacemaker    . Abdominal hysterectomy    . Tubal ligation    . Small intestine surgery    . Cardiac catheterization    . Epicardial pacing lead placement Left 03/17/2012    Procedure: EPICARDIAL PACING LEAD PLACEMENT;  Surgeon: [name], MD;  Location: [facility] OR;  Service: Thoracic;  Laterality: Left;  placement of LV epicardial leads  . Esophagogastroduodenoscopy N/A 06/09/2012    Procedure: ESOPHAGOGASTRODUODENOSCOPY (EGD);  Surgeon: Iva Boop, MD;  Location: [facility];  Service: Endoscopy;  Laterality:  N/A;  . Colonoscopy N/A 06/09/2012    Procedure: COLONOSCOPY;  Surgeon: Iva Boop, MD;  Location: [facility];  Service: Endoscopy;  Laterality: N/A;  . Hot hemostasis N/A 06/09/2012    Procedure: HOT HEMOSTASIS (ARGON PLASMA COAGULATION/BICAP);  Surgeon: Iva Boop, MD;  Location: [facility];  Service: Endoscopy;  Laterality: N/A;   Family History  Problem Relation Age of Onset  . Adopted: Yes  . Coronary artery disease    . Other      stomach carcinoma  . Stomach cancer Other   . Coronary artery disease Other   . Cancer Mother    History  Substance Use Topics  . Smoking status: Former Smoker    Types: Cigarettes    Quit date: 01/21/2009  . Smokeless tobacco: Former Neurosurgeon    Quit date: 01/21/2009  . Alcohol Use: No   OB History   Grav Para Term Preterm Abortions TAB SAB Ect Mult Living                 Review of Systems All other systems reviewed and are negative Allergies  Review of patient's allergies indicates no known allergies.  Home Medications   Current Outpatient Rx  Name  Route  Sig  Dispense  Refill  . aspirin EC 81 MG tablet   Oral   Take 81 mg by mouth every morning.          [name] atorvastatin (LIPITOR) 40 MG tablet   Oral   Take 20 mg by mouth every morning. Take 1/2 - 1 tab daily         . carvedilol (COREG) 25 MG tablet   Oral   Take 25 mg by mouth 2 (two) times daily with a meal.           . cyclobenzaprine (FLEXERIL) 5 MG tablet   Oral   Take 5 mg by mouth at bedtime. Take 1 tab po q hs         . DULoxetine (CYMBALTA) 30 MG capsule   Oral   Take 30 mg by mouth every morning.          . furosemide (LASIX) 80 MG tablet   Oral   Take 0.5 tablets (40 mg total) by mouth every morning.   30 tablet   9   . irbesartan (AVAPRO) 150 MG tablet   Oral   Take 150 mg by mouth every morning.         . lansoprazole (PREVACID) 15 MG capsule   Oral   Take 15 mg by mouth every morning.          . potassium chloride (K-DUR) 10  MEQ tablet   Oral   Take 1 tablet (10 mEq total) by mouth 2 (two) times daily.   30 tablet   0   . traMADol (ULTRAM) 50 MG tablet   Oral   Take 50 mg by mouth daily. One tab daily         . ADVAIR DISKUS 250-50 MCG/DOSE AEPB   Inhalation   Inhale 1 puff into the lungs 2 (two) times daily.          . COMBIVENT RESPIMAT 20-100 MCG/ACT AERS respimat   Inhalation   Inhale 1 puff into the lungs every 6 (six) hours as needed for wheezing or shortness of breath.          . tiotropium (SPIRIVA HANDIHALER) 18 MCG inhalation capsule   Inhalation   Place 1 capsule (18 mcg total) into inhaler and inhale daily.   30 capsule   11    BP 175/95  Pulse 70  Temp(Src) 98.2 F (36.8 C) (Oral)  Resp 13  SpO2 99% Physical Exam  Nursing note and vitals reviewed. Constitutional: She is oriented to person, place, and time. She appears well-developed and well-nourished. No distress.  HENT:  Head: Normocephalic and atraumatic.  Eyes: Pupils are equal, round, and reactive to light.  Neck: Normal range of motion.  Cardiovascular: Normal rate and intact distal pulses.   Pulmonary/Chest: No respiratory distress. She has rales (Rales noted at bases to auscultation).  Abdominal: Normal appearance. She exhibits no distension.  Musculoskeletal: Normal range of motion. She exhibits edema (4+ pitting edema lower extremities bilaterally.).  Neurological: She is alert and oriented to person, place, and time. No cranial nerve deficit.  Skin: Skin is warm and dry. No rash noted.  Psychiatric: She has a normal mood and affect. Her behavior is normal.    ED Course  Procedures (including critical care time)   Date: 08/26/2012  Rate: 70  Rhythm: Paced rhythm  QRS Axis: Paced rhythm  Intervals: normal  ST/T Wave abnormalities: Nonspecific  Conduction Disutrbances: Intraventricular conduction defect secondary to paced rhythm  Narrative Interpretation: Abnormal  EKG Medications  furosemide (LASIX)  injection 80 mg (not administered)      Labs Reviewed  BASIC METABOLIC PANEL - Abnormal; Notable for the following:    Potassium 3.4 (*)    Glucose, Bld 120 (*)    GFR calc non Af Amer 55 (*)    GFR calc Af Amer 64 (*)    All other components within normal limits  PRO B NATRIURETIC PEPTIDE - Abnormal; Notable for the following:    Pro B Natriuretic peptide (BNP) 12238.0 (*)    All other components within normal limits  CBC  POCT I-STAT TROPONIN I   Dg Chest 2 View  08/26/2012   *RADIOLOGY REPORT*  Clinical Data: Shortness of breath  CHEST - 2 VIEW  Comparison: 07/01/2012  Findings: Chronic interstitial markings.  No frank interstitial edema.  Left basilar opacity, likely reflecting a combination of small pleural effusion and lower lobe atelectasis, mildly improved.  No pneumothorax.  Cardiomegaly.  Left subclavian pacemaker.  Degenerative changes of the visualized thoracolumbar spine.  IMPRESSION: Small right pleural effusion with associated lower lobe atelectasis, improved.   Original Report Authenticated By: Charline Bills, M.D.   1. Congestive heart failure, unspecified failure chronicity, unspecified type     MDM    Nelia Shi, MD 08/26/12 [phone]

## 2012-08-26 NOTE — Progress Notes (Signed)
Report given to receiving RN. No concerns at this time. Patient is stable, with no signs or symptoms of distress or discomfort. 

## 2012-08-26 NOTE — Progress Notes (Signed)
Patient ID: [name] MRN: [id number] DOB/AGE: 10-31-1932 79 y.o. Admit date: 08/26/2012  Primary Cardiologist: Sharrell Ku  HPI: 79 year old female with history of atrial fibrillation, non-ischemic cardiomyopathy, complete heart block, status post biventricular pacemaker, HTN, GERD who is admitted with SOB, volume overload. She has been taking all meds. Over last 48 hours has noticed increase in swelling in legs. She has also noticed increased SOB prompting presentation to ED. EKG is paced. CXR without pulmonary edema. There is a small right pleural effusion. BNP of 12,000. She is being admitted by the Hospitalist service. We are asked to consult.   Review of systems complete and found to be negative unless listed above   Past Medical History  Diagnosis Date  . HTN (hypertension)   . Hernia     Incisional hernia, umbilicus.  . H/O: hysterectomy   . CHB (complete heart block)     s/p pacer (Tenant => GT)  . Atrial fibrillation     on xarleto  . Depression   . Goiter   . DJD (degenerative joint disease)   . GERD (gastroesophageal reflux disease)   . Diverticulosis   . Cardiomyopathy secondary     Myoview 3/13: small inf scar apex to base with mild peri-infarct isch; EF 32%;   LHC 05/12/11: pLAD 20%, pCFX 20%, mid 30%, m+dRCA with mild luminal irregs; EF 35% with mid inf wal and inf-apical HK(no culprit for segmental WMA)  . CAD (coronary artery disease)     nonobs by [facility] 4/13  . Systolic CHF   . Pacemaker     upgrade to BiV PPM 02/19/12 (LV lead off)  . H/O hiatal hernia   . LBBB (left bundle branch block)   . Iron deficiency anemia, unspecified 06/07/2012  . Angiodysplasia of stomach 06/09/2012    Family History  Problem Relation Age of Onset  . Adopted: Yes  . Coronary artery disease    . Other      stomach carcinoma  . Stomach cancer Other   . Coronary artery disease Other   . Cancer Mother     History   Social History  . Marital Status: Married    Spouse Name:  [name]    Number of Children: 3  . Years of Education: 14   Occupational History  .     Social History Main Topics  . Smoking status: Former Smoker    Types: Cigarettes    Quit date: 01/21/2009  . Smokeless tobacco: Former Neurosurgeon    Quit date: 01/21/2009  . Alcohol Use: No  . Drug Use: No  . Sexually Active: No   Other Topics Concern  . Not on file   Social History Narrative   Marital Status:  Married [name])   Children:  3 (Daughter 1955,  Son 18, Son 1964)   Living Situation: Lives with spouse in a one-level home with 3 steps to enter.  They moved to [facility] from [place] since [name] is from this area.     Occupation: Retired Proofreader)   Education: Associates Degree   Tobacco Use/Exposure: 3 packs per week for > 50 years; She quit smoking in 2010     Alcohol Use: Occasional   Drug Use:  None   Diet:  Healthy   Exercise:  None   Hobbies:  Crossword Puzzles, Knitting, Reading, Sewing                       Past Surgical History  Procedure Laterality  Date  . Pacemaker placement  , 2002.  [name] Cholecystectomy    . Insert / replace / remove pacemaker    . Abdominal hysterectomy    . Tubal ligation    . Small intestine surgery    . Cardiac catheterization    . Epicardial pacing lead placement Left 03/17/2012    Procedure: EPICARDIAL PACING LEAD PLACEMENT;  Surgeon: [name], MD;  Location: [facility] OR;  Service: Thoracic;  Laterality: Left;  placement of LV epicardial leads  . Esophagogastroduodenoscopy N/A 06/09/2012    Procedure: ESOPHAGOGASTRODUODENOSCOPY (EGD);  Surgeon: Iva Boop, MD;  Location: [facility];  Service: Endoscopy;  Laterality: N/A;  . Colonoscopy N/A 06/09/2012    Procedure: COLONOSCOPY;  Surgeon: Iva Boop, MD;  Location: [facility] ENDOSCOPY;  Service: Endoscopy;  Laterality: N/A;  . Hot hemostasis N/A 06/09/2012    Procedure: HOT HEMOSTASIS (ARGON PLASMA COAGULATION/BICAP);  Surgeon: Iva Boop, MD;  Location: [facility];  Service: Endoscopy;   Laterality: N/A;    No Known Allergies  Prior to Admission Meds:  No current facility-administered medications for this encounter.   Current Outpatient Prescriptions  Medication Sig Dispense Refill  . aspirin EC 81 MG tablet Take 81 mg by mouth every morning.       [name] atorvastatin (LIPITOR) 40 MG tablet Take 20 mg by mouth every morning. Take 1/2 - 1 tab daily      . carvedilol (COREG) 25 MG tablet Take 25 mg by mouth 2 (two) times daily with a meal.        . cyclobenzaprine (FLEXERIL) 5 MG tablet Take 5 mg by mouth at bedtime. Take 1 tab po q hs      . DULoxetine (CYMBALTA) 30 MG capsule Take 30 mg by mouth every morning.       . furosemide (LASIX) 80 MG tablet Take 0.5 tablets (40 mg total) by mouth every morning.  30 tablet  9  . irbesartan (AVAPRO) 150 MG tablet Take 150 mg by mouth every morning.      . lansoprazole (PREVACID) 15 MG capsule Take 15 mg by mouth every morning.       . potassium chloride (K-DUR) 10 MEQ tablet Take 1 tablet (10 mEq total) by mouth 2 (two) times daily.  30 tablet  0  . traMADol (ULTRAM) 50 MG tablet Take 50 mg by mouth daily. One tab daily      . ADVAIR DISKUS 250-50 MCG/DOSE AEPB Inhale 1 puff into the lungs 2 (two) times daily.       . COMBIVENT RESPIMAT 20-100 MCG/ACT AERS respimat Inhale 1 puff into the lungs every 6 (six) hours as needed for wheezing or shortness of breath.       . tiotropium (SPIRIVA HANDIHALER) 18 MCG inhalation capsule Place 1 capsule (18 mcg total) into inhaler and inhale daily.  30 capsule  11    Physical Exam: Blood pressure 164/102, pulse 63, temperature 98.2 F (36.8 C), temperature source Oral, resp. rate 19, SpO2 100.00%.  General: Well developed, well nourished, NAD  HEENT: OP clear, mucus membranes moist  SKIN: warm, dry. No rashes.  Neuro: No focal deficits  Musculoskeletal: Muscle strength 5/5 all ext  Psychiatric: Mood and affect normal  Neck: No JVD, no carotid bruits, no thyromegaly, no lymphadenopathy.    Lungs:Clear bilaterally, no wheezes, rhonci, crackles  Cardiovascular: Regular rate and rhythm. No murmurs, gallops or rubs.  Abdomen:Soft. Bowel sounds present. Non-tender.  Extremities: No lower extremity edema. Pulses are 2 +  in the bilateral DP/PT.  Labs:   Lab Results  Component Value Date   WBC 5.7 08/26/2012   HGB 12.7 08/26/2012   HCT 37.9 08/26/2012   MCV 91.3 08/26/2012   PLT 170 08/26/2012    Recent Labs Lab 08/26/12 1442  NA 142  K 3.4*  CL 105  CO2 28  BUN 13  CREATININE 0.96  CALCIUM 10.0  GLUCOSE 120*     Cardiac cath 05/12/11: Left main: No obstructive disease.  Left Anterior Descending Artery: Large caliber vessel that courses to the apex. 20% stenosis proximal vessel. There are 2 moderate sized diagonal branches that are free of disease.  Circumflex Artery: Large caliber vessel with 20% proximal stenosis, 30% mid stenosis. The first two obtuse marginal branches are small in caliber and have no disease. The third OM branch is moderate sized and has no disease.  Right Coronary Artery: Large, dominant vessel with mild luminal irregularities in the mid and distal vessel.  Left Ventricular Angiogram: LVEF 35%. There is hypokinesis of the mid inferior wall and inferoapical wall.   Echo 06/07/12: Left ventricle: Septal dyssynergy from pacing. Mild global hypokinesis. Inferior wall moves less well than other walls. The cavity size was at the upper limits of normal. Wall thickness was increased in a pattern of moderate LVH. The estimated ejection fraction was 45%. Findings consistent with left ventricular diastolic dysfunction. - Mitral valve: Prolapse of anterior leaflet. Posteriorly directed MR is at least moderate. - Left atrium: The atrium was mildly dilated. - Right ventricle: The cavity size was normal. Pacer wire or catheter noted in right ventricle. Systolic function was normal. RV systolic pressure: 75mm Hg (S, est). - Right atrium: The atrium was mildly  dilated. - Tricuspid valve: Moderate-severe regurgitation. - Pulmonary arteries: PA peak pressure: 75mm Hg (S). - Inferior vena cava: The vessel was dilated; the respirophasic diameter changes were blunted (< 50%); findings are consistent with elevated central venous pressure.   Radiology: CXR: Small right pleural effusion with associated lower lobe atelectasis, improved.  EKG: Ventricular paced rhythm  ASSESSMENT AND PLAN:   1. Acute on chronic systolic CHF: She is clearly volume overloaded. Agree with admission for diuresis with IV Lasix. Would start Lasix 80 mg IV BID. Continue other medications as listed.   2. CAD: She is known to have minimal CAD by cath 2013. Stable.   3. Non-ischemic cardiomyopathy: Continue medical therapy.     MCALHANY,CHRISTOPHER 08/26/2012, 5:09 PM

## 2012-08-26 NOTE — Telephone Encounter (Signed)
Pt's husband called because she said pt is SOB and is getting worse, no other symptoms, Husband states pt is in the bed taken a nap and is using many pillows  to be able to breath. Pt 's husband states pt was in the ER in May for the same thing. Husband was encouraged to take pt to the ER, husband agreed. Then I suggested for pt to call 911 so pt can get oxygen sooner and  Won't need to wait in the ER and pt will be seen sooner; then husband got very upset " I can take her, I have a car; who do you think you are talking to, are you suggesting that [facility] don't help people". You don't need to work for that hospital; you need to be fired" and hung up the phone. Daun Peacock to let her know about pt's arrival to the ER. Trish  Aware.

## 2012-08-26 NOTE — ED Notes (Signed)
Pt and family reports pt has hx of chf and having sob and swelling to ankles. Denies any cp. ekg done at triage.

## 2012-08-27 DIAGNOSIS — D649 Anemia, unspecified: Secondary | ICD-10-CM

## 2012-08-27 LAB — BASIC METABOLIC PANEL
CO2: 27 mEq/L (ref 19–32)
Calcium: 9.9 mg/dL (ref 8.4–10.5)
Creatinine, Ser: 0.78 mg/dL (ref 0.50–1.10)
GFR calc Af Amer: >90 mL/min (ref >90)
GFR calc non Af Amer: 78 mL/min — ABNORMAL LOW (ref >90)
Sodium: 140 mEq/L (ref 135–145)

## 2012-08-27 LAB — URINALYSIS, ROUTINE W REFLEX MICROSCOPIC
Bilirubin Urine: NEGATIVE
Glucose, UA: NEGATIVE mg/dL
Hgb urine dipstick: NEGATIVE
Ketones, ur: NEGATIVE mg/dL
Protein, ur: NEGATIVE mg/dL
pH: 7.5 (ref 5.0–8.0)

## 2012-08-27 LAB — TROPONIN I: Troponin I: <0.3 ng/mL (ref <0.30)

## 2012-08-27 MED ORDER — IRBESARTAN 150 MG PO TABS
150.0000 mg | ORAL_TABLET | Freq: Every day | ORAL | Status: DC
  Administered 2012-08-28 – 2012-08-31 (×4): 150 mg via ORAL
  Filled 2012-08-27 (×4): qty 1

## 2012-08-27 NOTE — Progress Notes (Signed)
Pt very confused and impulsive, will not follow command, bed alarm on. Lenny Pastel NP notified and ordered for a sitter at bedside. Will continue to monitor.

## 2012-08-27 NOTE — Progress Notes (Signed)
[facility] PROGRESS NOTE  [name] WJX:[id number] DOB: 10/14/1932 DOA: 08/26/2012 PCP: [name], MD  Assessment/Plan:  Acute on chronic decompensated systolic heart failure  - Appreciate cardiology input, - continue  IV Lasix 80mg  Q12  - cardiac enzymes negative  - 4.3L negative so far, daily weights.  - last ECHO 4/14 with EF of 45%  Hypertension  - stable on  home medications  Hypokalemia  - corrected  Coronary artery disease  - Continue ASA/Coreg/Statin  Atrial fibrillation  - Status post pacemaker.  - rate controlled  Recent history of GI bleed  - Hemoglobin stable  Confusion -improved - may have mild underlying dementia - TSH normal  COPD: -stable, continue inhalers  DVT prophylaxis  - Heparin SQ  Code Status: DNR  Family Communication: sister in law at bedside  Disposition Plan: inpatient   Consultants:  [place] cards  HPI/Subjective: Starting to breathe better  Objective: Filed Vitals:   08/27/12 0006 08/27/12 0421 08/27/12 0900 08/27/12 1335  BP: 164/85 178/81 151/67 153/59  Pulse:  76 69 71  Temp: 97.5 F (36.4 C) 97.1 F (36.2 C)  97.2 F (36.2 C)  TempSrc: Oral Oral  Oral  Resp:  16  16  Height:      Weight:  76.204 kg (168 lb)    SpO2:  98%  100%    Intake/Output Summary (Last 24 hours) at 08/27/12 1431 Last data filed at 08/27/12 1400  Gross per 24 hour  Intake    363 ml  Output   5975 ml  Net  -5612 ml   Filed Weights   08/26/12 1850 08/27/12 0421  Weight: 91.4 kg (201 lb 8 oz) 76.204 kg (168 lb)    Exam:   General: AAOx3  Cardiovascular: S1S2/RRR  Respiratory: decreased BS at bases  Abdomen: soft, Nt, BS present  Musculoskeletal: no edema c/c   Data Reviewed: Basic Metabolic Panel:  Recent Labs Lab 08/26/12 1442 08/26/12 2009 08/27/12 0645  NA 142  --  140  K 3.4*  --  3.6  CL 105  --  100  CO2 28  --  27  GLUCOSE 120*  --  110*  BUN 13  --  11  CREATININE 0.96  --  0.78  CALCIUM  10.0  --  9.9  MG  --  1.9  --    Liver Function Tests: No results found for this basename: AST, ALT, ALKPHOS, BILITOT, PROT, ALBUMIN,  in the last 168 hours No results found for this basename: LIPASE, AMYLASE,  in the last 168 hours No results found for this basename: AMMONIA,  in the last 168 hours CBC:  Recent Labs Lab 08/26/12 1442  WBC 5.7  HGB 12.7  HCT 37.9  MCV 91.3  PLT 170   Cardiac Enzymes:  Recent Labs Lab 08/26/12 2009 08/26/12 2310 08/27/12 0645  TROPONINI <0.30 <0.30 <0.30   BNP (last 3 results)  Recent Labs  01/24/12 0550 03/12/12 0945 08/26/12 1442  PROBNP 2122.0* 5680.0* 12238.0*   CBG: No results found for this basename: GLUCAP,  in the last 168 hours  No results found for this or any previous visit (from the past 240 hour(s)).   Studies: Dg Chest 2 View  08/26/2012   *RADIOLOGY REPORT*  Clinical Data: Shortness of breath  CHEST - 2 VIEW  Comparison: 07/01/2012  Findings: Chronic interstitial markings.  No frank interstitial edema.  Left basilar opacity, likely reflecting a combination of small pleural effusion and lower lobe atelectasis, mildly  improved.  No pneumothorax.  Cardiomegaly.  Left subclavian pacemaker.  Degenerative changes of the visualized thoracolumbar spine.  IMPRESSION: Small right pleural effusion with associated lower lobe atelectasis, improved.   Original Report Authenticated By: Charline Bills, M.D.    Scheduled Meds: . aspirin EC  81 mg Oral q morning - 10a  . atorvastatin  20 mg Oral q1800  . carvedilol  25 mg Oral BID WC  . cyclobenzaprine  5 mg Oral QHS  . DULoxetine  30 mg Oral q morning - 10a  . furosemide  80 mg Intravenous Q12H  . heparin  5,000 Units Subcutaneous Q8H  . [START ON 08/28/2012] irbesartan  150 mg Oral Daily  . mometasone-formoterol  2 puff Inhalation BID  . pantoprazole  20 mg Oral Daily  . sodium chloride  3 mL Intravenous Q12H  . tiotropium  18 mcg Inhalation Daily   Continuous Infusions:    Active Problems:   Essential hypertension, benign   Sinoatrial node dysfunction   PPM-St.Jude   Atrial fibrillation   CAD (coronary artery disease)   Nonischemic cardiomyopathy   Chronic systolic heart failure   Dyspnea on exertion   Lower extremity edema   Acute on chronic systolic CHF (congestive heart failure)    Time spent:    [facility]  [facility] Pager [phone]. If 7PM-7AM, please contact night-coverage at [email or web address], password [facility] 08/27/2012, 2:31 PM  LOS: 1 day

## 2012-08-27 NOTE — Progress Notes (Signed)
Subjective:  Says her breathing is somewhat better at the present time.  Objective:  Vital Signs in the last 24 hours: BP 151/67  Pulse 69  Temp(Src) 97.1 F (36.2 C) (Oral)  Resp 16  Ht 5\' 9"  (1.753 m)  Wt 76.204 kg (168 lb)  BMI 24.8 kg/m2  SpO2 98%  Physical Exam: Pleasant black female in no acute distress.  Lungs:  Clear  Cardiac:  Regular rhythm, normal S1 and S2, no S3, soft systolic murmur Abdomen:  Soft, nontender, no masses Extremities: 2-3+ edema  Intake/Output from previous day: 07/18 0701 - 07/19 0700 In: 0  Out: 3800 [Urine:3800] Weight Filed Weights   08/26/12 1850 08/27/12 0421  Weight: 91.4 kg (201 lb 8 oz) 76.204 kg (168 lb)    Lab Results: Basic Metabolic Panel:  Recent Labs  86/57/84 1442 08/27/12 0645  NA 142 140  K 3.4* 3.6  CL 105 100  CO2 28 27  GLUCOSE 120* 110*  BUN 13 11  CREATININE 0.96 0.78    CBC:  Recent Labs  08/26/12 1442  WBC 5.7  HGB 12.7  HCT 37.9  MCV 91.3  PLT 170    BNP    Component Value Date/Time   PROBNP 12238.0* 08/26/2012 1442    PROTIME: Lab Results  Component Value Date   INR 1.5* 06/29/2012   INR 1.48 03/15/2012   INR 2.4 09/03/2011    Telemetry: Paced rhythm at a rate of 70  Assessment/Plan:  1. Nonischemic cardiomyopathy with acute systolic congestive heart failure 2. Left bundle branch block 3. Biventricular pacemaker with LV lead currently off   Recommendations:  She is diuresing but still significantly volume overloaded with 2+ edema. Continue diuresis. Not really sure about the weights.     Darden Palmer  MD [facility]  08/27/2012, 10:48 AM

## 2012-08-27 NOTE — Progress Notes (Signed)
Pt on bed alarm, very impulsive, NAD noted, able to communicate needs, will continue to monitor

## 2012-08-27 NOTE — Progress Notes (Signed)
Pt's BP=178/81, P=76, pt resting in bed, no prn BP med, Benedetto Coons NP notified, awaiting order at this time.

## 2012-08-28 LAB — BASIC METABOLIC PANEL
CO2: 30 mEq/L (ref 19–32)
Calcium: 9.7 mg/dL (ref 8.4–10.5)
Creatinine, Ser: 0.81 mg/dL (ref 0.50–1.10)
GFR calc Af Amer: 79 mL/min — ABNORMAL LOW (ref >90)
GFR calc non Af Amer: 68 mL/min — ABNORMAL LOW (ref >90)
Sodium: 141 mEq/L (ref 135–145)

## 2012-08-28 LAB — PRO B NATRIURETIC PEPTIDE: Pro B Natriuretic peptide (BNP): 8384 pg/mL — ABNORMAL HIGH (ref 0–450)

## 2012-08-28 MED ORDER — POTASSIUM CHLORIDE CRYS ER 20 MEQ PO TBCR
60.0000 meq | EXTENDED_RELEASE_TABLET | Freq: Once | ORAL | Status: AC
  Administered 2012-08-28: 60 meq via ORAL
  Filled 2012-08-28: qty 3

## 2012-08-28 NOTE — Progress Notes (Signed)
Subjective:  No complaints of shortness of breath, she's had some intermittent confusion. Her weight is all over the place and so it is really hard to tell what the accurate weight is.  Objective:  Vital Signs in the last 24 hours: BP 155/78  Pulse 68  Temp(Src) 97.3 F (36.3 C) (Oral)  Resp 18  Ht 5\' 9"  (1.753 m)  Wt 83.099 kg (183 lb 3.2 oz)  BMI 27.04 kg/m2  SpO2 100%  Physical Exam: Pleasant black female in no acute distress. Confused at times. Lungs:  Clear  Cardiac:  Regular rhythm, normal S1 and S2, no S3, soft systolic murmur Abdomen:  Soft, nontender, no masses Extremities: 2-+ edema  Intake/Output from previous day: 07/19 0701 - 07/20 0700 In: 563 [P.O.:560; I.V.:3] Out: 3575 [Urine:3575] Weight Filed Weights   08/26/12 1850 08/27/12 0421 08/28/12 0422  Weight: 91.4 kg (201 lb 8 oz) 76.204 kg (168 lb) 83.099 kg (183 lb 3.2 oz)    Lab Results: Basic Metabolic Panel:  Recent Labs  16/10/96 0645 08/28/12 0400  NA 140 141  K 3.6 2.9*  CL 100 101  CO2 27 30  GLUCOSE 110* 108*  BUN 11 12  CREATININE 0.78 0.81    CBC:  Recent Labs  08/26/12 1442  WBC 5.7  HGB 12.7  HCT 37.9  MCV 91.3  PLT 170    BNP    Component Value Date/Time   PROBNP 8384.0* 08/28/2012 0400    PROTIME: Lab Results  Component Value Date   INR 1.5* 06/29/2012   INR 1.48 03/15/2012   INR 2.4 09/03/2011    Telemetry: Paced rhythm at a rate of 70  Assessment/Plan:  1. Nonischemic cardiomyopathy with acute systolic congestive heart failure 2. Left bundle branch block 3. Biventricular pacemaker with LV lead currently off 4. Hypokinemia in need of treatment   Recommendations:  She is diuresing but still significantly volume overloaded with 2+ edema. Continue diuresis. Not really sure about the weights.     Darden Palmer  MD [facility]  08/28/2012, 9:41 AM

## 2012-08-28 NOTE — Progress Notes (Signed)
[facility] PROGRESS NOTE  [name] EAV:[id number] DOB: 05/26/1932 DOA: 08/26/2012 PCP: [name], MD  Assessment/Plan:  Acute on chronic decompensated systolic heart failure  - Appreciate cardiology input, - continue  IV Lasix 80mg  Q12  - cardiac enzymes negative  - 6.8L negative so far, daily weights.  - last ECHO 4/14 with EF of 45%  Hypertension  - stable on  home medications  Hypokalemia  - replete  Coronary artery disease  -stable - Continue ASA/Coreg/Statin  Atrial fibrillation  - Status post pacemaker.  - rate controlled, not on anticoagulation likely due to GI bleeds  Recent history of GI bleed  - Hemoglobin stable  Confusion -improved, with sundowning  - may have mild underlying dementia - TSH normal  COPD: -stable, continue inhalers  DVT prophylaxis  - Heparin SQ  PtOT  Code Status: DNR  Family Communication: sister in law at bedside  Disposition Plan: home when improved   Consultants:  [place] cards  HPI/Subjective: Starting to breathe better  Objective: Filed Vitals:   08/28/12 0422 08/28/12 0911 08/28/12 0952 08/28/12 1346  BP: 155/78  166/84 128/81  Pulse: 68  70 67  Temp: 97.3 F (36.3 C)  97.2 F (36.2 C) 97.4 F (36.3 C)  TempSrc: Oral  Oral Oral  Resp: 18  18 20   Height:      Weight: 83.099 kg (183 lb 3.2 oz)     SpO2: 100% 100% 100% 98%    Intake/Output Summary (Last 24 hours) at 08/28/12 1753 Last data filed at 08/28/12 1327  Gross per 24 hour  Intake    320 ml  Output   1300 ml  Net   -980 ml   Filed Weights   08/26/12 1850 08/27/12 0421 08/28/12 0422  Weight: 91.4 kg (201 lb 8 oz) 76.204 kg (168 lb) 83.099 kg (183 lb 3.2 oz)    Exam:   General: AAOx3  Cardiovascular: S1S2/RRR  Respiratory: decreased BS at bases  Abdomen: soft, Nt, BS present  Musculoskeletal: no edema c/c   Data Reviewed: Basic Metabolic Panel:  Recent Labs Lab 08/26/12 1442 08/26/12 2009 08/27/12 0645  08/28/12 0400  NA 142  --  140 141  K 3.4*  --  3.6 2.9*  CL 105  --  100 101  CO2 28  --  27 30  GLUCOSE 120*  --  110* 108*  BUN 13  --  11 12  CREATININE 0.96  --  0.78 0.81  CALCIUM 10.0  --  9.9 9.7  MG  --  1.9  --   --    Liver Function Tests: No results found for this basename: AST, ALT, ALKPHOS, BILITOT, PROT, ALBUMIN,  in the last 168 hours No results found for this basename: LIPASE, AMYLASE,  in the last 168 hours No results found for this basename: AMMONIA,  in the last 168 hours CBC:  Recent Labs Lab 08/26/12 1442  WBC 5.7  HGB 12.7  HCT 37.9  MCV 91.3  PLT 170   Cardiac Enzymes:  Recent Labs Lab 08/26/12 2009 08/26/12 2310 08/27/12 0645  TROPONINI <0.30 <0.30 <0.30   BNP (last 3 results)  Recent Labs  03/12/12 0945 08/26/12 1442 08/28/12 0400  PROBNP 5680.0* 12238.0* 8384.0*   CBG: No results found for this basename: GLUCAP,  in the last 168 hours  No results found for this or any previous visit (from the past 240 hour(s)).   Studies: No results found.  Scheduled Meds: . aspirin EC  81  mg Oral q morning - 10a  . atorvastatin  20 mg Oral q1800  . carvedilol  25 mg Oral BID WC  . cyclobenzaprine  5 mg Oral QHS  . DULoxetine  30 mg Oral q morning - 10a  . furosemide  80 mg Intravenous Q12H  . heparin  5,000 Units Subcutaneous Q8H  . irbesartan  150 mg Oral Daily  . mometasone-formoterol  2 puff Inhalation BID  . pantoprazole  20 mg Oral Daily  . sodium chloride  3 mL Intravenous Q12H  . tiotropium  18 mcg Inhalation Daily   Continuous Infusions:   Active Problems:   Essential hypertension, benign   Sinoatrial node dysfunction   PPM-St.Jude   Atrial fibrillation   CAD (coronary artery disease)   Nonischemic cardiomyopathy   Chronic systolic heart failure   Dyspnea on exertion   Lower extremity edema   Acute on chronic systolic CHF (congestive heart failure)    Time spent:    [facility]  [facility] Pager [phone]. If 7PM-7AM, please contact night-coverage at [email or web address], password [facility] 08/28/2012, 5:53 PM  LOS: 2 days

## 2012-08-28 NOTE — Progress Notes (Signed)
Pt's K=2.9; Dr. Zannie Cove notified and ordered potassium po once.

## 2012-08-29 DIAGNOSIS — I10 Essential (primary) hypertension: Secondary | ICD-10-CM

## 2012-08-29 LAB — BASIC METABOLIC PANEL
CO2: 32 mEq/L (ref 19–32)
Calcium: 9.8 mg/dL (ref 8.4–10.5)
Creatinine, Ser: 0.79 mg/dL (ref 0.50–1.10)
GFR calc non Af Amer: 78 mL/min — ABNORMAL LOW (ref >90)
Sodium: 140 mEq/L (ref 135–145)

## 2012-08-29 MED ORDER — POTASSIUM CHLORIDE CRYS ER 20 MEQ PO TBCR
40.0000 meq | EXTENDED_RELEASE_TABLET | Freq: Two times a day (BID) | ORAL | Status: DC
  Administered 2012-08-29 – 2012-08-31 (×5): 40 meq via ORAL
  Filled 2012-08-29 (×6): qty 2

## 2012-08-29 MED ORDER — AMLODIPINE BESYLATE 10 MG PO TABS
10.0000 mg | ORAL_TABLET | Freq: Every day | ORAL | Status: DC
  Administered 2012-08-29 – 2012-08-31 (×3): 10 mg via ORAL
  Filled 2012-08-29 (×3): qty 1

## 2012-08-29 MED ORDER — FUROSEMIDE 40 MG PO TABS
60.0000 mg | ORAL_TABLET | Freq: Two times a day (BID) | ORAL | Status: DC
  Administered 2012-08-29 – 2012-08-31 (×4): 60 mg via ORAL
  Filled 2012-08-29 (×7): qty 1

## 2012-08-29 NOTE — Evaluation (Signed)
Physical Therapy Evaluation Patient Details Name: [name] MRN: [id number] DOB: [birth year] Today's Date: 08/29/2012 Time: 4098-1191 PT Time Calculation (min): 19 min  PT Assessment / Plan / Recommendation History of Present Illness  Patient is a 79 yo female admitted iwth SOB, LE swelling, hyponatremia, and confusion.  Patient with h/o NICM, CHF, Afib, pacemaker, HTN.  Clinical Impression  Patient presents with problems listed below.  Patient very unsteady with gait - will need physical assist with gait at home, not just supervision.  Patient has been using cane.  Recommend use of RW - will need to confirm that patient has one at home.  Patient will benefit from acute PT to maximize independence/safety prior to discharge home with husband.    PT Assessment  Patient needs continued PT services    Follow Up Recommendations  Home health PT;Supervision/Assistance - 24 hour;Supervision for mobility/OOB    Does the patient have the potential to tolerate intense rehabilitation      Barriers to Discharge        Equipment Recommendations  None recommended by PT    Recommendations for Other Services     Frequency Min 3X/week    Precautions / Restrictions Precautions Precautions: Fall Restrictions Weight Bearing Restrictions: No   Pertinent Vitals/Pain      Mobility  Bed Mobility Bed Mobility: Supine to Sit;Sit to Supine Supine to Sit: 5: Supervision;HOB flat Sit to Supine: 5: Supervision;HOB flat Details for Bed Mobility Assistance: Assist for safety only.  When PT instructed patient to lay down, she had difficulty figuring out what to do.  She stood back up, then sat down.  Repeated cues to patient x3 before patient understood what to do. Transfers Transfers: Sit to Stand;Stand to Sit Sit to Stand: 4: Min guard;With upper extremity assist;From bed Stand to Sit: 4: Min guard;With upper extremity assist;To bed Details for Transfer Assistance: Assist for balance and  safety. Ambulation/Gait Ambulation/Gait Assistance: 4: Min assist Ambulation Distance (Feet): 84 Feet Assistive device: Straight cane Ambulation/Gait Assistance Details: Patient with decreased balance with gait using cane.  Loss of balance noted x3 during gait.  Also noted Rt. knee buckling x2.  Assist required on these occasions to prevent falls. Gait Pattern: Step-through pattern;Decreased stride length;Trunk flexed;Right flexed knee in stance;Left flexed knee in stance Gait velocity: Slow gait speed    Exercises     PT Diagnosis: Difficulty walking;Abnormality of gait;Generalized weakness;Altered mental status  PT Problem List: Decreased strength;Decreased activity tolerance;Decreased balance;Decreased mobility;Decreased cognition;Decreased knowledge of use of DME;Decreased safety awareness PT Treatment Interventions: DME instruction;Gait training;Stair training;Functional mobility training;Balance training;Cognitive remediation;Patient/family education     PT Goals(Current goals can be found in the care plan section) Acute Rehab PT Goals Patient Stated Goal: None stated PT Goal Formulation: Patient unable to participate in goal setting Time For Goal Achievement: 09/05/12 Potential to Achieve Goals: Good  Visit Information  Last PT Received On: 08/29/12 Assistance Needed: +1 History of Present Illness: Patient is a 79 yo female admitted iwth SOB, LE swelling, hyponatremia, and confusion.  Patient with h/o NICM, CHF, Afib, pacemaker, HTN.       Prior Functioning  Home Living Family/patient expects to be discharged to:: Private residence Living Arrangements: Spouse/significant other Available Help at Discharge: Family;Available 24 hours/day Type of Home: House Home Access: Stairs to enter [employer] of Steps: 3 Entrance Stairs-Rails: Right Home Layout: One level Home Equipment: Walker - 2 wheels;Cane - single point;Shower seat - built in;Bedside commode Prior  Function Level of Independence: Needs assistance  Gait / Transfers Assistance Needed: S  ADL's / Homemaking Assistance Needed: S Communication Communication: No difficulties Dominant Hand: Right    Cognition  Cognition Arousal/Alertness: Awake/alert Behavior During Therapy: WFL for tasks assessed/performed Overall Cognitive Status: Impaired/Different from baseline Area of Impairment: Orientation;Following commands;Safety/judgement Orientation Level: Disoriented to;Place;Time;Situation Memory: Decreased short-term memory Following Commands: Follows one step commands inconsistently;Follows one step commands with increased time Safety/Judgement: Decreased awareness of safety    Extremity/Trunk Assessment Upper Extremity Assessment Upper Extremity Assessment: Overall WFL for tasks assessed Lower Extremity Assessment Lower Extremity Assessment: Generalized weakness   Balance    End of Session PT - End of Session Equipment Utilized During Treatment: Gait belt Activity Tolerance: Patient limited by fatigue Patient left: in bed;with call bell/phone within reach;with nursing/sitter in room Nurse Communication: Mobility status  GP     Vena Austria 08/29/2012, 4:58 PM Durenda Hurt. Renaldo Fiddler, [facility] Acute Rehab Services Pager [phone]

## 2012-08-29 NOTE — Progress Notes (Addendum)
[facility] PROGRESS NOTE  [name] ZOX:[id number] DOB: December 08, 1932 DOA: 08/26/2012 PCP: [name], MD  Assessment/Plan:  Acute on chronic decompensated systolic heart failure  - Appreciate cardiology input, - cardiac enzymes negative  - >8L negative so far, weight down by 20 lbs - Change Lasix to by mouth  - Bmet in am - last ECHO 4/14 with EF of 45%  Hypertension  - Blood pressure still up, had amlodipine -Continue ARB  Hypokalemia  - replete  Coronary artery disease  -stable - Continue ASA/Coreg/Statin  Atrial fibrillation  - Status post pacemaker.  - rate controlled, not on anticoagulation likely due to GI bleeds while on anticoagulation  Recent history of GI bleed  - Hemoglobin stable  Confusion -improved, with sundowning  - may have mild underlying dementia - TSH normal  COPD: -stable, continue inhalers  DVT prophylaxis  - Heparin SQ  PtOT  Code Status: DNR  Family Communication: sister in law at bedside  Disposition Plan: home tomorrow if stable   Consultants:  [place] cards  HPI/Subjective: Starting to breathe better, intermittent confusion  Objective: Filed Vitals:   08/29/12 0809 08/29/12 1030 08/29/12 1033 08/29/12 1051  BP:   141/109   Pulse:      Temp:      TempSrc:      Resp:      Height:      Weight:      SpO2: 98% 96% 95% 95%    Intake/Output Summary (Last 24 hours) at 08/29/12 1426 Last data filed at 08/29/12 1254  Gross per 24 hour  Intake    480 ml  Output   1451 ml  Net   -971 ml   Filed Weights   08/27/12 0421 08/28/12 0422 08/29/12 0449  Weight: 76.204 kg (168 lb) 83.099 kg (183 lb 3.2 oz) 82.4 kg (181 lb 10.5 oz)    Exam:   General: AA, oriented to self and partially to place, intermittently confused  Cardiovascular: S1S2/RRR  Respiratory: decreased BS at bases  Abdomen: soft, Nt, BS present  Musculoskeletal: no edema c/c   Data Reviewed: Basic Metabolic Panel:  Recent Labs Lab  08/26/12 1442 08/26/12 2009 08/27/12 0645 08/28/12 0400 08/29/12 0605  NA 142  --  140 141 140  K 3.4*  --  3.6 2.9* 3.3*  CL 105  --  100 101 97  CO2 28  --  27 30 32  GLUCOSE 120*  --  110* 108* 106*  BUN 13  --  11 12 11   CREATININE 0.96  --  0.78 0.81 0.79  CALCIUM 10.0  --  9.9 9.7 9.8  MG  --  1.9  --   --   --    Liver Function Tests: No results found for this basename: AST, ALT, ALKPHOS, BILITOT, PROT, ALBUMIN,  in the last 168 hours No results found for this basename: LIPASE, AMYLASE,  in the last 168 hours No results found for this basename: AMMONIA,  in the last 168 hours CBC:  Recent Labs Lab 08/26/12 1442  WBC 5.7  HGB 12.7  HCT 37.9  MCV 91.3  PLT 170   Cardiac Enzymes:  Recent Labs Lab 08/26/12 2009 08/26/12 2310 08/27/12 0645  TROPONINI <0.30 <0.30 <0.30   BNP (last 3 results)  Recent Labs  03/12/12 0945 08/26/12 1442 08/28/12 0400  PROBNP 5680.0* 12238.0* 8384.0*   CBG: No results found for this basename: GLUCAP,  in the last 168 hours  No results found for this or  any previous visit (from the past 240 hour(s)).   Studies: No results found.  Scheduled Meds: . amLODipine  10 mg Oral Daily  . aspirin EC  81 mg Oral q morning - 10a  . atorvastatin  20 mg Oral q1800  . carvedilol  25 mg Oral BID WC  . cyclobenzaprine  5 mg Oral QHS  . DULoxetine  30 mg Oral q morning - 10a  . furosemide  60 mg Oral BID  . heparin  5,000 Units Subcutaneous Q8H  . irbesartan  150 mg Oral Daily  . mometasone-formoterol  2 puff Inhalation BID  . pantoprazole  20 mg Oral Daily  . potassium chloride  40 mEq Oral BID  . sodium chloride  3 mL Intravenous Q12H  . tiotropium  18 mcg Inhalation Daily   Continuous Infusions:   Active Problems:   Essential hypertension, benign   Sinoatrial node dysfunction   PPM-[employer]   Atrial fibrillation   CAD (coronary artery disease)   Nonischemic cardiomyopathy   Chronic systolic heart failure   Dyspnea on  exertion   Lower extremity edema   Acute on chronic systolic CHF (congestive heart failure)    Time spent:    [place]  [facility] Pager [phone]. If 7PM-7AM, please contact night-coverage at [email or web address], password [facility] 08/29/2012, 2:26 PM  LOS: 3 days

## 2012-08-29 NOTE — Clinical Documentation Improvement (Signed)
THIS DOCUMENT IS NOT A PERMANENT PART OF THE MEDICAL RECORD  Please update your documentation with the medical record to reflect your response to this query. If you need help knowing how to do this please call [phone].  08/29/12   Dr. Jomarie Longs,  In a better effort to capture your patient's severity of illness, reflect appropriate length of stay and utilization of resources, a review of the patient medical record has revealed the following indicators:   - Admitted for Heart Failure   - History of HTN   - Echo 06/07/12  Moderate LVH, LV Diastolic Dysfunction    Based on your clinical judgment, please document in the progress notes and discharge summary if a condition belwo provides greater specificity regarding the patient's HTN, Echo Results and Heart Failure:   - Hypertensive Heart Disease   - Other Condition   - Unable to Clinically Determine   Reviewed:  09/07/12 - no response.  Query withdrawn - no ckd - so no htnsive heart and kidney dz to use as pdx with heart failure as mcc.  Mathis Dad RN  Thank You,  Jerral Ralph  Clinical Documentation Specialist: [phone] Health Information Management [place]

## 2012-08-29 NOTE — Evaluation (Signed)
Occupational Therapy Evaluation Patient Details Name: [name] MRN: [id number] DOB: 1932-10-18 Today's Date: 08/29/2012 Time: 1010-1037 OT Time Calculation (min): 27 min  OT Assessment / Plan / Recommendation History of present illness [name] is a 79 y.o. female has a past medical history significant for nonischemic cardiomyopathy with systolic heart failure (last 2-D echo April of 2014 showed an ejection fraction of 45%), atrial fibrillation status post biventricular pacer, hypertension, presents with a chief complaint of shortness of breath, worsening for the past 2 days. He also has noticed worsening of orthopnea as well as bilateral ankle swelling. She denies frank weight gain, but she's not sure. Her husband, who is in the room, also endorses at this point patient was confused and he reports that that she may have had couple episodes of hallucinations. She have had these in the past. She had a recent hospitalization one half months ago for a GI bleed, and at that time her Xarelto was discontinued. She is also complaining today of a "growth" on the left shoulder that has been there for the past few months. In the ED,blood work was significant for elevated BNP, mild hypokalemia And significantly fluid overloaded on physical exam.    Clinical Impression   Pt admitted with above. Pt currently with functional limitations due to the deficits listed below (see OT Problem List).  Pt will benefit from skilled OT to increase their safety and independence with ADL and functional mobility for ADL to facilitate discharge to venue listed below.       OT Assessment  Patient needs continued OT Services    Follow Up Recommendations  Home health OT       Equipment Recommendations   (TBD once can speak with family)       Frequency  Min 2X/week    Precautions / Restrictions Precautions Precautions: Fall Restrictions Weight Bearing Restrictions: No       ADL  Eating/Feeding:  Simulated;Independent Where Assessed - Eating/Feeding: Chair Grooming: Simulated;Set up;Supervision/safety Where Assessed - Grooming: Unsupported sitting Upper Body Bathing: Simulated;Set up;Supervision/safety Where Assessed - Upper Body Bathing: Unsupported sitting Lower Body Bathing: Simulated;Minimal assistance Where Assessed - Lower Body Bathing: Supported sit to stand Upper Body Dressing: Simulated;Set up;Supervision/safety Where Assessed - Upper Body Dressing: Unsupported sitting Lower Body Dressing: Simulated;Minimal assistance Where Assessed - Lower Body Dressing: Supported sit to stand Toilet Transfer: Simulated;Minimal assistance Toilet Transfer Method: Sit to Barista:  (Bed>door>back to recliner) Toileting - Architect and Hygiene: Simulated;Minimal assistance Where Assessed - Engineer, mining and Hygiene: Standing Equipment Used: Cane Transfers/Ambulation Related to ADLs: Min guard A sit<>stand, min A for ambulation with SPC    OT Diagnosis: Generalized weakness;Cognitive deficits  OT Problem List: Decreased strength;Impaired balance (sitting and/or standing);Decreased cognition;Decreased safety awareness;Decreased knowledge of use of DME or AE OT Treatment Interventions: Self-care/ADL training;Balance training;Therapeutic activities;Patient/family education   OT Goals(Current goals can be found in the care plan section) Acute Rehab OT Goals OT Goal Formulation: With patient Time For Goal Achievement: 09/05/12 Potential to Achieve Goals: Good ADL Goals Pt Will Perform Grooming: with set-up;with supervision;standing Pt Will Perform Upper Body Bathing: standing;with set-up;with supervision;sitting Pt Will Perform Lower Body Bathing: with set-up;with supervision;sit to/from stand Pt Will Perform Lower Body Dressing: sit to/from stand;with set-up;with supervision Pt Will Transfer to Toilet: with supervision;ambulating;regular  height toilet;grab bars Pt Will Perform Toileting - Clothing Manipulation and hygiene: with supervision;sit to/from stand Pt Will Perform Tub/Shower Transfer: with supervision;ambulating (not sure what setup is at home)  Visit Information  Last OT Received On: 08/29/12 Assistance Needed: +1 History of Present Illness: [name] is a 79 y.o. female has a past medical history significant for nonischemic cardiomyopathy with systolic heart failure (last 2-D echo April of 2014 showed an ejection fraction of 45%), atrial fibrillation status post biventricular pacer, hypertension, presents with a chief complaint of shortness of breath, worsening for the past 2 days. He also has noticed worsening of orthopnea as well as bilateral ankle swelling. She denies frank weight gain, but she's not sure. Her husband, who is in the room, also endorses at this point patient was confused and he reports that that she may have had couple episodes of hallucinations. She have had these in the past. She had a recent hospitalization one half months ago for a GI bleed, and at that time her Xarelto was discontinued. She is also complaining today of a "growth" on the left shoulder that has been there for the past few months. In the ED,blood work was significant for elevated BNP, mild hypokalemia And significantly fluid overloaded on physical exam.        Prior Functioning     Home Living Family/patient expects to be discharged to:: Private residence Living Arrangements: Spouse/significant other Available Help at Discharge: Family;Available 24 hours/day Type of Home: House Home Access: Stairs to enter [employer] of Steps: 3 Entrance Stairs-Rails: Right Home Layout: One level Home Equipment: Walker - 2 wheels;Cane - single point Additional Comments: All of this information is from prior admission 7 months ago, could not get ahold of family to confirm Prior Function Level of Independence: Needs  assistance Gait / Transfers Assistance Needed: S  ADL's / Homemaking Assistance Needed: S Communication Communication: No difficulties Dominant Hand: Right         Vision/Perception Vision - History Baseline Vision: Wears glasses for distance only Patient Visual Report: No change from baseline   Cognition  Cognition Arousal/Alertness: Awake/alert Behavior During Therapy: WFL for tasks assessed/performed Overall Cognitive Status: Impaired/Different from baseline Area of Impairment: Orientation;Safety/judgement Orientation Level: Disoriented to;Place;Time Safety/Judgement: Decreased awareness of safety General Comments: 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, civil (consulting) and drives (cannot confirm with family, but I do not believe this is true)    Extremity/Trunk Assessment Upper Extremity Assessment Upper Extremity Assessment: Overall WFL for tasks assessed Lower Extremity Assessment Lower Extremity Assessment: Defer to PT evaluation     Mobility Bed Mobility Bed Mobility: Supine to Sit;Sitting - Scoot to Edge of Bed Supine to Sit: 5: Supervision;HOB flat Sitting - Scoot to Edge of Bed: 5: Supervision Transfers Transfers: Sit to Stand;Stand to Sit Sit to Stand: 4: Min guard;With upper extremity assist;From bed Stand to Sit: 4: Min guard;With upper extremity assist;With armrests;To chair/3-in-1           End of Session OT - End of Session Equipment Utilized During Treatment: Gait belt Activity Tolerance: Patient tolerated treatment well Patient left: in chair;with call bell/phone within reach;with nursing/sitter in room Nurse Communication:  (Nurse in room while pt ambulating with me)       Evette Georges [phone] 08/29/2012, 10:52 AM

## 2012-08-29 NOTE — Progress Notes (Signed)
1200 ambulated with OT . Used walker unsteady gait . Follow verbal commands . Appropriate

## 2012-08-29 NOTE — Progress Notes (Signed)
   SUBJECTIVE:  Still somewhat confused per husband.  No pain.  No SOB.   PHYSICAL EXAM Filed Vitals:   08/29/12 1030 08/29/12 1033 08/29/12 1051 08/29/12 1451  BP:  141/109  152/56  Pulse:    78  Temp:    98.1 F (36.7 C)  TempSrc:    Oral  Resp:    18  Height:      Weight:      SpO2: 96% 95% 95% 98%   General:  No distress Lungs:  Clear Heart:  RRR Abdomen:  Positive bowel sounds, no rebound no guarding Extremities:  Mild ankle edema.   LABS:  Results for orders placed during the hospital encounter of 08/26/12 (from the past 24 hour(s))  BASIC METABOLIC PANEL     Status: Abnormal   Collection Time    08/29/12  6:05 AM      Result Value Range   Sodium 140  135 - 145 mEq/L   Potassium 3.3 (*) 3.5 - 5.1 mEq/L   Chloride 97  96 - 112 mEq/L   CO2 32  19 - 32 mEq/L   Glucose, Bld 106 (*) 70 - 99 mg/dL   BUN 11  6 - 23 mg/dL   Creatinine, Ser 1.61  0.50 - 1.10 mg/dL   Calcium 9.8  8.4 - 09.6 mg/dL   GFR calc non Af Amer 78 (*) >90 mL/min   GFR calc Af Amer >90  >90 mL/min    Intake/Output Summary (Last 24 hours) at 08/29/12 1519 Last data filed at 08/29/12 1254  Gross per 24 hour  Intake    480 ml  Output   1451 ml  Net   -971 ml    ASSESSMENT AND PLAN:  Acute on chronic systolic CHF (congestive heart failure):  Mild edema.  Probably can switch to PO diuretic in the AM.    Essential hypertension, benign:  Amlodipine added per primary team.    Atrial fibrillation:  She has not been on Xarelto since an admission earlier this year when this was held for evaluation of GI bleed.  This will need to be addressed in the future by Dr. Ladona Ridgel.        [name] 08/29/2012 3:19 PM

## 2012-08-29 NOTE — Progress Notes (Signed)
1000 spoken to pt's daughter in the phone . Updated with general condition

## 2012-08-30 DIAGNOSIS — F09 Unspecified mental disorder due to known physiological condition: Secondary | ICD-10-CM | POA: Diagnosis present

## 2012-08-30 LAB — BASIC METABOLIC PANEL
Calcium: 9.9 mg/dL (ref 8.4–10.5)
GFR calc Af Amer: 77 mL/min — ABNORMAL LOW (ref >90)
GFR calc non Af Amer: 67 mL/min — ABNORMAL LOW (ref >90)
Glucose, Bld: 111 mg/dL — ABNORMAL HIGH (ref 70–99)
Potassium: 3.7 mEq/L (ref 3.5–5.1)
Sodium: 141 mEq/L (ref 135–145)

## 2012-08-30 MED ORDER — AMLODIPINE BESYLATE 10 MG PO TABS: 10.0000 mg | ORAL_TABLET | Freq: Every day | ORAL | Status: DC

## 2012-08-30 MED ORDER — POTASSIUM CHLORIDE ER 10 MEQ PO TBCR: 20.0000 meq | EXTENDED_RELEASE_TABLET | Freq: Two times a day (BID) | ORAL | Status: DC

## 2012-08-30 MED ORDER — FUROSEMIDE 20 MG PO TABS: 60.0000 mg | ORAL_TABLET | Freq: Two times a day (BID) | ORAL | Status: DC

## 2012-08-30 MED ORDER — CHLORHEXIDINE GLUCONATE 4 % EX LIQD
60.0000 mL | Freq: Once | CUTANEOUS | Status: AC
  Administered 2012-08-30: 4 via TOPICAL
  Filled 2012-08-30 (×3): qty 60

## 2012-08-30 NOTE — Progress Notes (Signed)
In to speak with pt. About home health services.  Pt. States she has had [facility] in past and was satisfied with their services.  TC to [place], with [facility], to give referral for [facility] PT/OT/RN.  Pt. To dc home today.  Tera Mater, RN, BSN NCM [phone]

## 2012-08-30 NOTE — Progress Notes (Signed)
Arrie Aran[employer] rep for pacemaker, informed me that [name]'s pacer is not communicating with [employer] and she is not safe to be discharged until seen by electrophysiologist.  She has paged Dr. Graciela Husbands to inform.  Pt and spouse informed of need to not be discharged home per above plan.  Dr. Jomarie Longs informed via telephone of plan and d/c cancelled.  Telemetry reapplied  And rhythm V- Paced at 67 with QRS = 0.11 and QT = 0.40

## 2012-08-30 NOTE — Progress Notes (Signed)
Subjective:  No chest pain.  No dyspnea. No complaints today.  Objective:  Vital Signs in the last 24 hours: Temp:  [97.3 F (36.3 C)-98.1 F (36.7 C)] 97.4 F (36.3 C) (07/22 0629) Pulse Rate:  [67-78] 68 (07/22 0629) Resp:  [18-19] 19 (07/22 0629) BP: (103-152)/(56-109) 150/60 mmHg (07/22 0401) SpO2:  [95 %-100 %] 98 % (07/22 0629) Weight:  [176 lb 12.8 oz (80.196 kg)] 176 lb 12.8 oz (80.196 kg) (07/22 0629)  Intake/Output from previous day: 07/21 0701 - 07/22 0700 In: 720 [P.O.:720] Out: 200 [Urine:200] Intake/Output from this shift:    . amLODipine  10 mg Oral Daily  . aspirin EC  81 mg Oral q morning - 10a  . atorvastatin  20 mg Oral q1800  . carvedilol  25 mg Oral BID WC  . cyclobenzaprine  5 mg Oral QHS  . DULoxetine  30 mg Oral q morning - 10a  . furosemide  60 mg Oral BID  . heparin  5,000 Units Subcutaneous Q8H  . irbesartan  150 mg Oral Daily  . mometasone-formoterol  2 puff Inhalation BID  . pantoprazole  20 mg Oral Daily  . potassium chloride  40 mEq Oral BID  . sodium chloride  3 mL Intravenous Q12H  . tiotropium  18 mcg Inhalation Daily      Physical Exam: The patient appears to be in no distress.  Head and neck exam reveals that the pupils are equal and reactive.  The extraocular movements are full.  There is no scleral icterus.  Mouth and pharynx are benign.  No lymphadenopathy.  No carotid bruits.  The jugular venous pressure is normal.  Thyroid is not enlarged or tender.  Chest is clear to percussion and auscultation.  No rales or rhonchi.  Expansion of the chest is symmetrical.  Heart reveals no abnormal lift or heave.  First and second heart sounds are normal.  There is no murmur gallop rub or click.  The abdomen is soft and nontender.  Bowel sounds are normoactive.  There is no hepatosplenomegaly or mass.  There are no abdominal bruits.  Extremities reveal no phlebitis or edema.  Pedal pulses are good.  There is no cyanosis or  clubbing.  Neurologic exam is normal strength and no lateralizing weakness.  No sensory deficits.  Integument reveals no rash  Lab Results: No results found for this basename: WBC, HGB, PLT,  in the last 72 hours  Recent Labs  08/28/12 0400 08/29/12 0605  NA 141 140  K 2.9* 3.3*  CL 101 97  CO2 30 32  GLUCOSE 108* 106*  BUN 12 11  CREATININE 0.81 0.79   No results found for this basename: TROPONINI, CK, MB,  in the last 72 hours Hepatic Function Panel No results found for this basename: PROT, ALBUMIN, AST, ALT, ALKPHOS, BILITOT, BILIDIR, IBILI,  in the last 72 hours No results found for this basename: CHOL,  in the last 72 hours No results found for this basename: PROTIME,  in the last 72 hours  Imaging: Imaging results have been reviewed  Cardiac Studies: Atrial fib with paced ventricular response. Assessment/Plan:  Acute on chronic systolic CHF (congestive heart failure): Good weight loss overnight 5 lb on oral lasix now. Essential hypertension, benign: Amlodipine added per primary team.  Atrial fibrillation: She has not been on Xarelto since an admission earlier this year when this was held for evaluation of GI bleed. This will need to be addressed in the future by Dr.  Ladona Ridgel.  Hypokalemia:  Repletion per primary service.  Patient appears to be nearing maximal hospital benefit. ? Discharge soon?   LOS: 4 days    Cassell Clement 08/30/2012, 7:49 AM

## 2012-08-30 NOTE — Progress Notes (Addendum)
[facility] PROGRESS NOTE  [name] ZOX:[id number] DOB: 1932/07/05 DOA: 08/26/2012 PCP: [name], MD Brief Narrative:  80/F with PMH of chronic systolic CHF, Afib,BiV Pacer,  HTN, was admitted 7/18 with Progressive dyspnea on exertion, found to have acute systolic CHF, she was aggressively diuresed and weights down by 20-25lbs, Cardiology following. In addition she also has intermittent confusion and trouble with recent memory. Today s/p eval per [facility] and per EP needs pacemaker change.  Assessment/Plan:  Acute on chronic decompensated systolic heart failure  - Appreciate cardiology input, - cardiac enzymes negative  - >8L negative so far, weight down by >20 lbs - Change Lasix to by mouth 60mg  BID -medically stable for Dc but apparently has pacemaker malfunction as reported by [facility] rep, Dr.Klein to see for this. - Bmet in am - last ECHO 4/14 with EF of 45%  Hypertension  - Blood pressure still up, had amlodipine -Continue ARB  Hypokalemia  - replete  Coronary artery disease  -stable - Continue ASA/Coreg/Statin  Atrial fibrillation  - Status post pacemaker.  - rate controlled, not on anticoagulation likely due to GI bleeds while on anticoagulation -Fu with Dr.Taylor to make decision regarding this.  Recent history of GI bleed  - Hemoglobin stable  Confusion -improved, with sundowning  - may have mild underlying dementia - TSH normal  COPD: -stable, continue inhalers  DVT prophylaxis  - Heparin SQ  PtOT  Code Status: DNR  Family Communication: sister in law at bedside Disposition Plan: home with [facility] services when ok with cards after pacer change   Consultants:  [place] cards  EP  HPI/Subjective: Starting to breathe better, intermittent confusion  Objective: Filed Vitals:   08/30/12 0629 08/30/12 0816 08/30/12 1037 08/30/12 1443  BP:   132/73 132/72  Pulse: 68  70 66  Temp: 97.4 F (36.3 C)  98.1 F (36.7 C) 97.6 F (36.4 C)   TempSrc: Oral  Oral Oral  Resp: 19  20 20   Height:      Weight: 80.196 kg (176 lb 12.8 oz)     SpO2: 98% 97% 98% 100%    Intake/Output Summary (Last 24 hours) at 08/30/12 1532 Last data filed at 08/30/12 1326  Gross per 24 hour  Intake   1020 ml  Output    900 ml  Net    120 ml   Filed Weights   08/28/12 0422 08/29/12 0449 08/30/12 0629  Weight: 83.099 kg (183 lb 3.2 oz) 82.4 kg (181 lb 10.5 oz) 80.196 kg (176 lb 12.8 oz)    Exam:   General: AA, oriented to self and partially to place, intermittently confused  Cardiovascular: S1S2/RRR  Respiratory: decreased BS at bases  Abdomen: soft, Nt, BS present  Musculoskeletal: no edema c/c   Data Reviewed: Basic Metabolic Panel:  Recent Labs Lab 08/26/12 1442 08/26/12 2009 08/27/12 0645 08/28/12 0400 08/29/12 0605 08/30/12 0705  NA 142  --  140 141 140 141  K 3.4*  --  3.6 2.9* 3.3* 3.7  CL 105  --  100 101 97 101  CO2 28  --  27 30 32 30  GLUCOSE 120*  --  110* 108* 106* 111*  BUN 13  --  11 12 11 14   CREATININE 0.96  --  0.78 0.81 0.79 0.82  CALCIUM 10.0  --  9.9 9.7 9.8 9.9  MG  --  1.9  --   --   --   --    Liver Function Tests: No  results found for this basename: AST, ALT, ALKPHOS, BILITOT, PROT, ALBUMIN,  in the last 168 hours No results found for this basename: LIPASE, AMYLASE,  in the last 168 hours No results found for this basename: AMMONIA,  in the last 168 hours CBC:  Recent Labs Lab 08/26/12 1442  WBC 5.7  HGB 12.7  HCT 37.9  MCV 91.3  PLT 170   Cardiac Enzymes:  Recent Labs Lab 08/26/12 2009 08/26/12 2310 08/27/12 0645  TROPONINI <0.30 <0.30 <0.30   BNP (last 3 results)  Recent Labs  03/12/12 0945 08/26/12 1442 08/28/12 0400  PROBNP 5680.0* 12238.0* 8384.0*   CBG: No results found for this basename: GLUCAP,  in the last 168 hours  No results found for this or any previous visit (from the past 240 hour(s)).   Studies: No results found.  Scheduled Meds: . amLODipine   10 mg Oral Daily  . aspirin EC  81 mg Oral q morning - 10a  . atorvastatin  20 mg Oral q1800  . carvedilol  25 mg Oral BID WC  . cyclobenzaprine  5 mg Oral QHS  . DULoxetine  30 mg Oral q morning - 10a  . furosemide  60 mg Oral BID  . heparin  5,000 Units Subcutaneous Q8H  . irbesartan  150 mg Oral Daily  . mometasone-formoterol  2 puff Inhalation BID  . pantoprazole  20 mg Oral Daily  . potassium chloride  40 mEq Oral BID  . sodium chloride  3 mL Intravenous Q12H  . tiotropium  18 mcg Inhalation Daily   Continuous Infusions:   Active Problems:   Essential hypertension, benign   Sinoatrial node dysfunction   PPM-St.Jude   Atrial fibrillation   CAD (coronary artery disease)   Nonischemic cardiomyopathy   Chronic systolic heart failure   Dyspnea on exertion   Lower extremity edema   Acute on chronic systolic CHF (congestive heart failure)    Time spent:    [facility]  [facility] Pager [phone]. If 7PM-7AM, please contact night-coverage at [email or web address], password [facility] 08/30/2012, 3:32 PM  LOS: 4 days

## 2012-08-30 NOTE — Progress Notes (Signed)
Ward Givens, NP in to explain procedure for tomorrow, with patient.  Stated she is on board for 4pm time and if earlier, staff will be notified tomorrow.  Pt's husband called @ [phone] and informed of scheduled time for procedure and that he can call around 9am to see if time has been moved to earlier time slot.  He stated understanding.

## 2012-08-31 ENCOUNTER — Encounter (HOSPITAL_COMMUNITY)
Admission: EM | Disposition: A | Discharge status: Home / Self Care | Payer: Self-pay | Source: Home / Self Care | Attending: Internal Medicine

## 2012-08-31 LAB — BASIC METABOLIC PANEL
BUN: 12 mg/dL (ref 6–23)
Creatinine, Ser: 0.78 mg/dL (ref 0.50–1.10)
GFR calc Af Amer: >90 mL/min (ref >90)
GFR calc non Af Amer: 78 mL/min — ABNORMAL LOW (ref >90)
Potassium: 3.7 mEq/L (ref 3.5–5.1)

## 2012-08-31 SURGERY — PACEMAKER GENERATOR CHANGE
Anesthesia: LOCAL

## 2012-08-31 MED ORDER — CHLORHEXIDINE GLUCONATE 4 % EX LIQD
60.0000 mL | Freq: Once | CUTANEOUS | Status: AC
  Administered 2012-08-31: 4 via TOPICAL
  Filled 2012-08-31: qty 60

## 2012-08-31 MED ORDER — ONDANSETRON HCL 4 MG/2ML IJ SOLN: 4.0000 mg | Freq: Four times a day (QID) | INTRAMUSCULAR | Status: DC

## 2012-08-31 NOTE — Progress Notes (Signed)
Occupational Therapy Treatment Patient Details Name: [name] MRN: [id number] DOB: April 12, 1932 Today's Date: 08/31/2012 Time: 1478-2956 OT Time Calculation (min): 26 min  OT Assessment / Plan / Recommendation  History of present illness Patient is a 79 yo female admitted iwth SOB, LE swelling, hyponatremia, and confusion.  Patient with h/o NICM, CHF, Afib, pacemaker, HTN.   Clinical Impression Pt overall close supervision level for selfcare tasks and functional transfers using the RW.  Needs cueing to use the walker correctly and still demonstrates decreased orientation to place and situation.  Throughout session pt perseverating on going home.  Did not verbalize understanding of why she has to stay in the hospital (pacemaker).  Will need 24 hour supervision if discharged home.       Follow Up Recommendations  Home health OT       Equipment Recommendations  None recommended by OT       Frequency Min 2X/week   Progress towards OT Goals Progress towards OT goals: Progressing toward goals  Plan Discharge plan remains appropriate    Precautions / Restrictions Precautions Precautions: Fall Restrictions Weight Bearing Restrictions: No   Pertinent Vitals/Pain O2 sats 97% on room air, HR 81 BPM     ADL  Grooming: Performed;Supervision/safety Where Assessed - Grooming: Unsupported standing Toilet Transfer: Simulated;Supervision/safety Toilet Transfer Method: Other (comment) (Ambulating with the RW.) Transfers/Ambulation Related to ADLs: Pt currently supervision for mobility with use of the RW. ADL Comments: Pt stood at the sink for 5 mins while performing grooming tasks with close supervision.  O2 sats maintained at 97% on room air with activity.  Pt still with increased confusion, perseverating on leaving the hospital.  Pt not understanding that the MD has to make the decision.      OT Goals(current goals can now be found in the care plan section)   Visit Information  Last OT  Received On: 08/31/12 Assistance Needed: +1 History of Present Illness: Patient is a 79 yo female admitted iwth SOB, LE swelling, hyponatremia, and confusion.  Patient with h/o NICM, CHF, Afib, pacemaker, HTN.          Cognition  Cognition Arousal/Alertness: Awake/alert Behavior During Therapy: Anxious Overall Cognitive Status: Impaired/Different from baseline Area of Impairment: Orientation;Memory;Awareness Orientation Level: Disoriented to;Time;Situation Memory: Decreased short-term memory Following Commands: Follows one step commands consistently Safety/Judgement: Decreased awareness of safety General Comments: Pt unable to understand that the therapist does not decide whether she can go home and the MD has to make that decision.  Pt perseverating on this throughout the entire session.      Mobility  Bed Mobility Bed Mobility: Supine to Sit Supine to Sit: 6: Modified independent (Device/Increase time);HOB flat Sitting - Scoot to Edge of Bed: 5: Supervision Transfers Transfers: Sit to Stand Sit to Stand: 5: Supervision;From bed;With upper extremity assist Stand to Sit: 5: Supervision;To chair/3-in-1;With upper extremity assist Details for Transfer Assistance: Pt currently supervision but needs cueing to stay inside of the walker with mobility.      Exercises      Balance Balance Balance Assessed: Yes Static Standing Balance Static Standing - Balance Support: No upper extremity supported Static Standing - Level of Assistance: 5: Stand by assistance   End of Session OT - End of Session Equipment Utilized During Treatment: Gait belt Activity Tolerance: Patient tolerated treatment well Patient left: in chair;with call bell/phone within reach;with nursing/sitter in room Nurse Communication: Mobility status     Joniel Graumann OTR/L Pager number [phone] 08/31/2012, 9:38 AM

## 2012-08-31 NOTE — Progress Notes (Signed)
Physical Therapy Treatment Patient Details Name: [name] MRN: [id number] DOB: 01/31/1933 Today's Date: 08/31/2012 Time: 1100-1115 PT Time Calculation (min): 15 min  PT Assessment / Plan / Recommendation  History of Present Illness Patient is a 79 yo female admitted iwth SOB, LE swelling, hyponatremia, and confusion.  Patient with h/o NICM, CHF, Afib, pacemaker, HTN.   Clinical Impression    PT Comments   Pt stated she was feeling better.  Stated she amb with OT using a walker and liked that better.   Follow Up Recommendations  Home health PT;Supervision/Assistance - 24 hour;Supervision for mobility/OOB     Does the patient have the potential to tolerate intense rehabilitation     Barriers to Discharge        Equipment Recommendations  None recommended by PT    Recommendations for Other Services    Frequency Min 3X/week   Progress towards PT Goals Progress towards PT goals: Progressing toward goals  Plan      Precautions / Restrictions Precautions Precautions: Fall    Pertinent Vitals/Pain C/o fatigue    Mobility  Bed Mobility Bed Mobility: Not assessed Details for Bed Mobility Assistance: Pt OOB in recliner Transfers Transfers: Sit to Stand;Stand to Sit Sit to Stand: 5: Supervision;With upper extremity assist;From chair/3-in-1 Stand to Sit: 5: Supervision;To chair/3-in-1;With upper extremity assist Details for Transfer Assistance: Pt currently supervision but needs cueing to stay inside of the walker with mobility.   Ambulation/Gait Ambulation/Gait Assistance: 4: Min assist Ambulation Distance (Feet): 92 Feet Assistive device: Rolling walker Ambulation/Gait Assistance Details: Pt stated she felt more comfortable using RW.  Amb in hallway with increased time and caution to R knee buckling.  Gait Pattern: Step-through pattern;Decreased stride length;Trunk flexed;Right flexed knee in stance;Left flexed knee in stance Gait velocity: Slow gait speed     PT Goals  (current goals can now be found in the care plan section)    Visit Information  Last PT Received On: 08/31/12 Assistance Needed: +1 History of Present Illness: Patient is a 79 yo female admitted iwth SOB, LE swelling, hyponatremia, and confusion.  Patient with h/o NICM, CHF, Afib, pacemaker, HTN.    Subjective Data      Cognition       Balance     End of Session PT - End of Session Equipment Utilized During Treatment: Gait belt Activity Tolerance: Patient tolerated treatment well Patient left: in chair;with call bell/phone within reach;with family/visitor present   Felecia Shelling  PTA [facility]  Acute  Rehab Pager      [phone]

## 2012-08-31 NOTE — Progress Notes (Signed)
Patient evaluated for community based chronic disease management services with [facility] Management Program as a benefit of patient's [employer]. Spoke with patient's son at bedside to explain [facility] Care Management services because of patient's dementia.  Son feels that he can manage her care without assistance at this time.  Left contact information and [facility] literature at bedside. Made inpatient Case Manager aware that [facility] Care Management has consulted. Of note, [facility] Care Management services does not replace or interfere with any services that are arranged by inpatient case management or social work.  For additional questions or referrals please contact Anibal Henderson BSN RN [facility] Liaison at [phone].

## 2012-08-31 NOTE — Progress Notes (Signed)
ELECTROPHYSIOLOGY ROUNDING NOTE    Patient Name: [name] Date of Encounter: 08/31/2012    SUBJECTIVE  Admitted with nonischemic CM and acute/chronic NGE:[id number] did not sleep well last night.  Her pacemaker was found yesterday to be in hardware reset mode and was unable to be interrogated.    TELEMETRY: Reviewed telemetry pt in atrial fibrillation with ventricular pacing Filed Vitals:   08/30/12 1443 08/30/12 2009 08/30/12 2141 08/31/12 0545  BP: 132/72  100/85 162/82  Pulse: 66  66 67  Temp: 97.6 F (36.4 C)  97.7 F (36.5 C) 97.7 F (36.5 C)  TempSrc: Oral  Oral Oral  Resp: 20  20 20   Height:      Weight:    174 lb 2.6 oz (79 kg)  SpO2: 100% 98% 99% 96%    Intake/Output Summary (Last 24 hours) at 08/31/12 0708 Last data filed at 08/31/12 0148  Gross per 24 hour  Intake    600 ml  Output   2150 ml  Net  -1550 ml    CURRENT MEDICATIONS: . amLODipine  10 mg Oral Daily  . aspirin EC  81 mg Oral q morning - 10a  . atorvastatin  20 mg Oral q1800  . carvedilol  25 mg Oral BID WC  . cyclobenzaprine  5 mg Oral QHS  . DULoxetine  30 mg Oral q morning - 10a  . furosemide  60 mg Oral BID  . heparin  5,000 Units Subcutaneous Q8H  . irbesartan  150 mg Oral Daily  . mometasone-formoterol  2 puff Inhalation BID  . pantoprazole  20 mg Oral Daily  . potassium chloride  40 mEq Oral BID  . sodium chloride  3 mL Intravenous Q12H  . tiotropium  18 mcg Inhalation Daily    LABS: Basic Metabolic Panel:  Recent Labs  24/40/10 0705 08/31/12 0445  NA 141 141  K 3.7 3.7  CL 101 103  CO2 30 31  GLUCOSE 111* 117*  BUN 14 12  CREATININE 0.82 0.78  CALCIUM 9.9 9.9    Radiology/Studies:  Dg Chest 2 View 08/26/2012   *RADIOLOGY REPORT*  Clinical Data: Shortness of breath  CHEST - 2 VIEW  Comparison: 07/01/2012  Findings: Chronic interstitial markings.  No frank interstitial edema.  Left basilar opacity, likely reflecting a combination of small pleural effusion and lower lobe  atelectasis, mildly improved.  No pneumothorax.  Cardiomegaly.  Left subclavian pacemaker.  Degenerative changes of the visualized thoracolumbar spine.  IMPRESSION: Small right pleural effusion with associated lower lobe atelectasis, improved.   Original Report Authenticated By: Charline Bills, M.D.    PHYSICAL EXAM Well developed and nourished in no acute distress HENT normal Neck supple with JVP-flat Device pocket well healed; without hematoma or erythema.  There is no tethering  4 incisons Clear Regular rate and rhythm, no murmurs or gallops Abd-soft with active BS No Clubbing cyanosis tr edema Skin-warm and dry A & Oriented  Grossly normal sensory and motor function     Active Problems:   Essential hypertension, benign   Sinoatrial node dysfunction   PPM-St.Jude   Atrial fibrillation   CAD (coronary artery disease)   Nonischemic cardiomyopathy   Chronic systolic heart failure   Dyspnea on exertion   Lower extremity edema   Acute on chronic systolic CHF (congestive heart failure)   Cognitive dysfunction   Device reset occurred and there remains some uncertainty regarding device function.  It is to be reassessed later today and if ok, would  go ahead and discharge pt with early followup with [facility] She has had multiple procedures)( i count 4 scars) so risk of intervention is high

## 2012-08-31 NOTE — Discharge Summary (Signed)
Physician Discharge Summary  [name] ZOX:[id number] DOB: [birth year] DOA: 08/26/2012  PCP: [name], MD  Admit date: 08/26/2012 Discharge date: 08/31/2012  Time spent: 30 minutes  Recommendations for Outpatient Follow-up:  1. Follow up with Dr. Sharrell Ku as soon as possible 2. Follow up with Dr. Graciela Husbands as soon as possible.  Discharge Diagnoses:  Active Problems:   Essential hypertension, benign   Sinoatrial node dysfunction   PPM-St.Jude   Atrial fibrillation   CAD (coronary artery disease)   Nonischemic cardiomyopathy   Chronic systolic heart failure   Dyspnea on exertion   Lower extremity edema   Acute on chronic systolic CHF (congestive heart failure)   Cognitive dysfunction   Discharge Condition: Stable  Diet recommendation: Heart Healthy  Filed Weights   08/29/12 0449 08/30/12 0629 08/31/12 0545  Weight: 82.4 kg (181 lb 10.5 oz) 80.196 kg (176 lb 12.8 oz) 79 kg (174 lb 2.6 oz)    History of present illness:  [name] is a 79 y.o. female has a past medical history significant for nonischemic cardiomyopathy with systolic heart failure (last 2-D echo April of 2014 showed an ejection fraction of 45%), atrial fibrillation status post biventricular pacer, hypertension, presents with a chief complaint of shortness of breath, worsening for the past 2 days. He also has noticed worsening of orthopnea as well as bilateral ankle swelling. She denies frank weight gain, but she's not sure. Her husband, who is in the room, also endorses at this point patient was confused and he reports that that she may have had couple episodes of hallucinations. She have had these in the past. She denies chest pain, abdominal pain, nausea, vomiting, fever or chills. She had a recent hospitalization one half months ago for a GI bleed, and at that time her Xarelto was discontinued. She is also complaining today of a "growth" on the left shoulder that has been there for the past few months. In the  ED,blood work was significant for elevated BNP, mild hypokalemia And significantly fluid overloaded on physical exam. [facility] was asked by the ED physician to admit. Cardiology has been consulted by the ED physician.  Hospital Course:   The patient was admitted to the in patient service. Cardiology was consulted. The patient was continued on IV lasix with good results. The patient's lasix was ultimately changed to 60mg  bid.  The patient was noted to be confused initially during this admission. This improved with bouts of sundowning. The patient is suspected of having mild dementia at baseline.  Regarding the patient's history of atrial fibrillation, the heart rate remained rate controlled. However, towards the end of the patient's hospital stay, she was noted to have a reported pacer malfunction. A device reset occurred with still some uncertainty regarding the device function. The patient was reassessed later in the day by EP with recommendations for close follow up with Dr. Rosette Reveal on discharge.  Consultations: [place] cards  EP  Discharge Exam: Filed Vitals:   08/30/12 2141 08/31/12 0545 08/31/12 0840 08/31/12 1300  BP: 100/85 162/82  144/81  Pulse: 66 67 81 74  Temp: 97.7 F (36.5 C) 97.7 F (36.5 C)  98 F (36.7 C)  TempSrc: Oral Oral  Oral  Resp: 20 20  20   Height:      Weight:  79 kg (174 lb 2.6 oz)    SpO2: 99% 96% 97% 99%    General: Awake, in nad Cardiovascular: regular, s1, s2 Respiratory: normal resp effort, no wheezing  Discharge  Instructions      Discharge Orders   Future Appointments Provider Department Dept Phone   09/15/2012 2:20 PM Beatrice Lecher, PA-C [facility] Main Office [place]) [phone]   Future Orders Complete By Expires     Diet - low sodium heart healthy  As directed     Increase activity slowly  As directed         Medication List         ADVAIR DISKUS 250-50 MCG/DOSE Aepb  Generic drug:  Fluticasone-Salmeterol   Inhale 1 puff into the lungs 2 (two) times daily.     amLODipine 10 MG tablet  Commonly known as:  NORVASC  Take 1 tablet (10 mg total) by mouth daily.     aspirin EC 81 MG tablet  Take 81 mg by mouth every morning.     atorvastatin 40 MG tablet  Commonly known as:  LIPITOR  Take 20 mg by mouth every morning. Take 1/2 - 1 tab daily     carvedilol 25 MG tablet  Commonly known as:  COREG  Take 25 mg by mouth 2 (two) times daily with a meal.     COMBIVENT RESPIMAT 20-100 MCG/ACT Aers respimat  Generic drug:  Ipratropium-Albuterol  Inhale 1 puff into the lungs every 6 (six) hours as needed for wheezing or shortness of breath.     cyclobenzaprine 5 MG tablet  Commonly known as:  FLEXERIL  Take 5 mg by mouth at bedtime. Take 1 tab po q hs     DULoxetine 30 MG capsule  Commonly known as:  CYMBALTA  Take 30 mg by mouth every morning.     furosemide 20 MG tablet  Commonly known as:  LASIX  Take 3 tablets (60 mg total) by mouth 2 (two) times daily. TAke 60mg  Twice a day for 3 days then down to 40mg  BID     irbesartan 150 MG tablet  Commonly known as:  AVAPRO  Take 150 mg by mouth every morning.     lansoprazole 15 MG capsule  Commonly known as:  PREVACID  Take 15 mg by mouth every morning.     potassium chloride 10 MEQ tablet  Commonly known as:  K-DUR  Take 2 tablets (20 mEq total) by mouth 2 (two) times daily.     tiotropium 18 MCG inhalation capsule  Commonly known as:  SPIRIVA HANDIHALER  Place 1 capsule (18 mcg total) into inhaler and inhale daily.     traMADol 50 MG tablet  Commonly known as:  ULTRAM  Take 50 mg by mouth daily. One tab daily       No Known Allergies Follow-up Information   Follow up with Lewayne Bunting, MD In 1 week.   Contact information:   [address] [place] [address] [phone]       Follow up with [facility]. [facility] nurse, physical therapy, and occupational therapy)    Contact information:    [phone]      Schedule an appointment as soon as possible for a visit with Sherryl Manges, MD.   Contact information:   [address] [place] [address] [phone]        The results of significant diagnostics from this hospitalization (including imaging, microbiology, ancillary and laboratory) are listed below for reference.    Significant Diagnostic Studies: Dg Chest 2 View  08/26/2012   *RADIOLOGY REPORT*  Clinical Data: Shortness of breath  CHEST - 2 VIEW  Comparison: 07/01/2012  Findings: Chronic interstitial markings.  No frank interstitial edema.  Left basilar opacity, likely reflecting a combination of small pleural effusion and lower lobe atelectasis, mildly improved.  No pneumothorax.  Cardiomegaly.  Left subclavian pacemaker.  Degenerative changes of the visualized thoracolumbar spine.  IMPRESSION: Small right pleural effusion with associated lower lobe atelectasis, improved.   Original Report Authenticated By: Charline Bills, M.D.    Microbiology: No results found for this or any previous visit (from the past 240 hour(s)).   Labs: Basic Metabolic Panel:  Recent Labs Lab 08/26/12 2009 08/27/12 0645 08/28/12 0400 08/29/12 0605 08/30/12 0705 08/31/12 0445  NA  --  140 141 140 141 141  K  --  3.6 2.9* 3.3* 3.7 3.7  CL  --  100 101 97 101 103  CO2  --  27 30 32 30 31  GLUCOSE  --  110* 108* 106* 111* 117*  BUN  --  11 12 11 14 12   CREATININE  --  0.78 0.81 0.79 0.82 0.78  CALCIUM  --  9.9 9.7 9.8 9.9 9.9  MG 1.9  --   --   --   --   --    Liver Function Tests: No results found for this basename: AST, ALT, ALKPHOS, BILITOT, PROT, ALBUMIN,  in the last 168 hours No results found for this basename: LIPASE, AMYLASE,  in the last 168 hours No results found for this basename: AMMONIA,  in the last 168 hours CBC:  Recent Labs Lab 08/26/12 1442  WBC 5.7  HGB 12.7  HCT 37.9  MCV 91.3  PLT 170   Cardiac Enzymes:  Recent Labs Lab  08/26/12 2009 08/26/12 2310 08/27/12 0645  TROPONINI <0.30 <0.30 <0.30   BNP: BNP (last 3 results)  Recent Labs  03/12/12 0945 08/26/12 1442 08/28/12 0400  PROBNP 5680.0* 12238.0* 8384.0*   CBG: No results found for this basename: GLUCAP,  in the last 168 hours     Signed:  CHIU, STEPHEN K  [facility] 08/31/2012, 4:41 PM

## 2012-08-31 NOTE — Progress Notes (Signed)
Patient's device reinterrogated and sent to tech services.  Pt now with normal device function.  Pt is stable for discharge to home per Dr Graciela Husbands.  We will arrange close outpatient follow up in our office.  Gypsy Balsam, RN, BSN 08/31/2012 1:47 PM

## 2012-09-15 ENCOUNTER — Ambulatory Visit (INDEPENDENT_AMBULATORY_CARE_PROVIDER_SITE_OTHER): Payer: Medicare Other | Admitting: Physician Assistant

## 2012-09-15 ENCOUNTER — Encounter: Payer: Self-pay | Admitting: Physician Assistant

## 2012-09-15 ENCOUNTER — Ambulatory Visit (INDEPENDENT_AMBULATORY_CARE_PROVIDER_SITE_OTHER): Payer: Medicare Other | Admitting: *Deleted

## 2012-09-15 VITALS — BP 127/78 | HR 70 | Ht 69.0 in | Wt 205.0 lb

## 2012-09-15 DIAGNOSIS — R0609 Other forms of dyspnea: Secondary | ICD-10-CM

## 2012-09-15 DIAGNOSIS — I4891 Unspecified atrial fibrillation: Secondary | ICD-10-CM

## 2012-09-15 DIAGNOSIS — I5023 Acute on chronic systolic (congestive) heart failure: Secondary | ICD-10-CM

## 2012-09-15 DIAGNOSIS — I495 Sick sinus syndrome: Secondary | ICD-10-CM

## 2012-09-15 DIAGNOSIS — Z950 Presence of cardiac pacemaker: Secondary | ICD-10-CM

## 2012-09-15 DIAGNOSIS — I10 Essential (primary) hypertension: Secondary | ICD-10-CM

## 2012-09-15 DIAGNOSIS — R0989 Other specified symptoms and signs involving the circulatory and respiratory systems: Secondary | ICD-10-CM

## 2012-09-15 DIAGNOSIS — I5022 Chronic systolic (congestive) heart failure: Secondary | ICD-10-CM

## 2012-09-15 DIAGNOSIS — I2510 Atherosclerotic heart disease of native coronary artery without angina pectoris: Secondary | ICD-10-CM

## 2012-09-15 LAB — PACEMAKER DEVICE OBSERVATION
DEVICE MODEL PM: 2874471
LV LEAD IMPEDENCE PM: 260 Ohm
LV LEAD THRESHOLD: 1.75 V
RV LEAD IMPEDENCE PM: 450 Ohm
RV LEAD THRESHOLD: 1.25 V

## 2012-09-15 MED ORDER — POTASSIUM CHLORIDE ER 10 MEQ PO TBCR: EXTENDED_RELEASE_TABLET | ORAL | Status: DC

## 2012-09-15 MED ORDER — WARFARIN SODIUM 5 MG PO TABS: ORAL_TABLET | ORAL | Status: DC

## 2012-09-15 MED ORDER — FUROSEMIDE 20 MG PO TABS: 60.0000 mg | ORAL_TABLET | Freq: Two times a day (BID) | ORAL | Status: DC

## 2012-09-15 NOTE — Patient Instructions (Addendum)
LAB TODAY: BMET, CBC, BNP  INCREASE LASIX TO 60MG  TWICE DAILY  INCREASE POTASSIUM TO IN THE AM AND IN THE PM  RX FOR LASIX, POTASSIUM, AND COUMADIN HAVE BEEN SENT TO YOUR PHARMACY  START COUMADIN 5MG  DAILY Monday - Friday  AND 2.5MG  Saturday AND Sunday   FOLLOW UP WITH THE COUMADIN CLINIC EARLY NEXT WEEK. MON/TUES   DISCONTINUE ASPIRIN ONCE INR IS GREATER THAN 2.0  LAB TO BE SCHEDULED THE SAME DAY AS COUMADIN APPT FOR REPEAT BMET  FOLLOW UP WITH DR.TAYLOR IN 1 MONTH

## 2012-09-15 NOTE — Progress Notes (Signed)
[address] [place], [place]  [address] Phone: [phone] Fax:  [phone]  Date:  09/15/2012   ID:  [name], DOB [birth year], MRN [id number]  PCP:  [name], MD  Cardiologist:  Dr. Lewayne Bunting     History of Present Illness: [name] is a 79 y.o. female who returns for followup after a recent admission to the hospital 7/18-7/23 with acute on chronic systolic CHF.  She has a hx of CHB status post pacer, permanent atrial fibrillation, LBBB, HTN, goiter, iron deficiency anemia, nonischemic cardiomyopathy, chronic systolic CHF. Myoview in 04/2011 demonstrated inferior scar with mild peri-infarct ischemia and EF 32%. Cardiac catheterization 05/2011 demonstrated mild nonobstructive CAD with an ejection fraction of 35%. She ultimately underwent upgrade to a biventricular pacer in 2014. She had difficulty with diaphragmatic stimulation which could not be reprogrammed. She then underwent epicardial LV lead placement by Dr. Laneta Simmers. This was complicated by left pleural effusion. Epicardial LV lead dislodged in 06/2012 and this was reconnected. Her hospitalization at that time c/b pulmonary edema. She was admitted in 05/2012 with syncope in the setting of heme positive anemia.  Xarelto was placed on hold at that time. Echo 05/2012: Sept dyssynergy from pacing, global HK, inf wall moves less well, mod LVH, EF 45%, diast dysfn, MVP of ant leaflet with mod MR post directed, mild BAE, mod to sev TR, PASP 75.  She was seen by GI.  EGD demonstrated angiodysplasia in the gastric antrum. This was ablated with APC. Colonoscopy demonstrated diverticulosis only.  At that time recommendation per GI was to resume Xarelto after 5 days. Of note, she has not resumed Xarelto since that time.   She was admitted recently with complaints of increased dyspnea, orthopnea and LE edema. She was diuresed with IV Lasix. Prior to discharge, it was noted that her device reset occurred. There was some  uncertainty regarding device function. Device was interrogated again by industry. This demonstrated normal device function.  She is here with her husband.  She denies any increase in her weights since d/c.  She notes stable DOE.  She probably is NYHA Class IIb-III.  Sleeps on 4 pillows chronically.  No PND.  No CP.  No syncope.    Labs (7/14):  K 3.7, Cr 0.78, BNP 8384, Hgb 12.7, TSH 0.892  Wt Readings from Last 3 Encounters:  09/15/12 205 lb (92.987 kg)  08/31/12 174 lb 2.6 oz (79 kg)  08/31/12 174 lb 2.6 oz (79 kg)     Past Medical History  Diagnosis Date  . HTN (hypertension)   . Hernia     Incisional hernia, umbilicus.  . H/O: hysterectomy   . CHB (complete heart block)     s/p pacer (Tenant => GT) => upgrade to BiV pacer  . Atrial fibrillation     on xarleto  . Depression   . Goiter   . DJD (degenerative joint disease)   . GERD (gastroesophageal reflux disease)   . Diverticulosis   . Cardiomyopathy secondary     Myoview 3/13: small inf scar + mild peri-infarct isch; EF 32%;     [name] CAD (coronary artery disease)     LHC 05/12/11: pLAD 20%, pCFX 20%, mid 30%, m+dRCA with mild lum irregs; EF 35% mid inf wal and inf-apical HK(no culprit for segmental WMA);    [name] Systolic CHF     Echo 05/2012: Sept dyssynergy from pacing, global HK, inf wall moves less well, mod LVH, EF 45%, diast dysfn,  MVP of ant leaflet with mod MR post directed, mild BAE, mod to sev TR, PASP 75  . Pacemaker     upgrade to BiV PPM 02/19/12 (LV lead off)  . H/O hiatal hernia   . LBBB (left bundle branch block)   . Iron deficiency anemia, unspecified 06/07/2012  . Angiodysplasia of stomach 06/09/2012    a. admx with syncope and heme + anemia;  angidysplasia tx with ablation with APC (GI recs ok to resume Xarelto after 5 days)    Current Outpatient Prescriptions  Medication Sig Dispense Refill  . ADVAIR DISKUS 250-50 MCG/DOSE AEPB Inhale 1 puff into the lungs 2 (two) times daily.       [name] amLODipine (NORVASC) 10 MG  tablet Take 1 tablet (10 mg total) by mouth daily.  30 tablet  0  . aspirin EC 81 MG tablet Take 81 mg by mouth every morning.       [name] atorvastatin (LIPITOR) 40 MG tablet Take 20 mg by mouth every morning. Take 1/2 - 1 tab daily      . carvedilol (COREG) 25 MG tablet Take 25 mg by mouth 2 (two) times daily with a meal.        . COMBIVENT RESPIMAT 20-100 MCG/ACT AERS respimat Inhale 1 puff into the lungs every 6 (six) hours as needed for wheezing or shortness of breath.       . cyclobenzaprine (FLEXERIL) 5 MG tablet Take 5 mg by mouth at bedtime. Take 1 tab po q hs      . DULoxetine (CYMBALTA) 30 MG capsule Take 30 mg by mouth every morning.       . furosemide (LASIX) 20 MG tablet Take 3 tablets (60 mg total) by mouth 2 (two) times daily. TAke 60mg  Twice a day for 3 days then down to 40mg  BID  30 tablet  0  . irbesartan (AVAPRO) 150 MG tablet Take 150 mg by mouth every morning.      . lansoprazole (PREVACID) 15 MG capsule Take 15 mg by mouth every morning.       . potassium chloride (K-DUR) 10 MEQ tablet Take 2 tablets (20 mEq total) by mouth 2 (two) times daily.  30 tablet  0  . tiotropium (SPIRIVA HANDIHALER) 18 MCG inhalation capsule Place 1 capsule (18 mcg total) into inhaler and inhale daily.  30 capsule  11  . traMADol (ULTRAM) 50 MG tablet Take 50 mg by mouth daily. Take two tabs 4 times a day as needed       No current facility-administered medications for this visit.    Allergies:   No Known Allergies  Social History:  The patient  reports that she quit smoking about 3 years ago. Her smoking use included Cigarettes. She smoked 0.00 packs per day. She quit smokeless tobacco use about 3 years ago. She reports that she does not drink alcohol or use illicit drugs.   ROS:  Please see the history of present illness.   Denies melena, hematochezia, hematuria.   All other systems reviewed and negative.   PHYSICAL EXAM: VS:  BP 127/78  Pulse 70  Ht 5\' 9"  (1.753 m)  Wt 205 lb (92.987 kg)   BMI 30.26 kg/m2 Well nourished, well developed, in no acute distress HEENT: normal Neck: + JVD Cardiac:  normal S1, S2; RRR; 2/6 systolic murmur LLSB Lungs:  Decreased breath sounds bilaterally, no wheezing, rhonchi or rales Abd: soft, nontender, no hepatomegaly Ext: no edema Skin: warm and dry Neuro:  CNs 2-12 intact, no focal abnormalities noted  EKG:  V paced, HR 70     ASSESSMENT AND PLAN:  1. Chronic Systolic CHF:  She is still volume overloaded.  Scales in the hospital had her at 174 at d/c and she is 205 today.  Scales at home steady around 189.  She has JVD but also has sig TR.  She would overall benefit from further diuresis.  Increase Lasix to 60 mg BID.  Increase K+ to 40 mEq in AM and 20 mEq in PM.  Check BMET, BNP today.  Repeat BMET in 1 week.  Continue beta blocker and ARB.  Consider adding spironolactone in the future. 2. Atrial Fibrillation:  She has remained off of anticoagulation since 05/2012.  I reviewed her chart extensively.  GI recommended that she could resume Xarelto 5 days after APC.  CHADS2-VASc=6.  She has a high stroke risk.  I had a long d/w the patient and her husband today.  I had considered Eliquis, but she prefers to resume coumadin.  I will check a CBC today.  Start Coumadin 5 mg QD except 2.5 mg on Sat and Sun.  Check INR with coumadin clinic early next week.  Stop ASA once INR 2.  Consider repeat CBC in 3-4 weeks. 3. S/p BiV Pacer:  Device interrogated today.  It is functioning appropriately.   4. Hypertension:  Controlled.  Continue current therapy.  5. Hyperlipidemia:  Continue statin.  6. CAD:  No angina.  Continue ASA and statin.  7. Disposition:  F/u with Dr. Lewayne Bunting in 1 month.   Signed, Tereso Newcomer, PA-C  09/15/2012 3:08 PM

## 2012-09-15 NOTE — Progress Notes (Signed)
Pacer ck per pt request. Device was "reset" 08/31/12. Normal device function today. Changed LV output from 4.0V to 3.5V, no other changes made, full details in [employer].  Gave pt wireless adaptor for [employer].   ROV w/ Dr. Ladona Ridgel 11/10/12 @ 10:00am.

## 2012-09-16 LAB — CBC WITH DIFFERENTIAL/PLATELET
Basophils Absolute: 0 10*3/uL (ref 0.0–0.1)
Basophils Relative: 0.3 % (ref 0.0–3.0)
Eosinophils Absolute: 0.2 10*3/uL (ref 0.0–0.7)
Lymphocytes Relative: 20.6 % (ref 12.0–46.0)
MCHC: 33.1 g/dL (ref 30.0–36.0)
Neutrophils Relative %: 72.9 % (ref 43.0–77.0)
Platelets: 213 10*3/uL (ref 150.0–400.0)
RBC: 3.8 Mil/uL — ABNORMAL LOW (ref 3.87–5.11)
WBC: 5.8 10*3/uL (ref 4.5–10.5)

## 2012-09-16 LAB — BASIC METABOLIC PANEL
BUN: 19 mg/dL (ref 6–23)
CO2: 31 mEq/L (ref 19–32)
Calcium: 9.4 mg/dL (ref 8.4–10.5)
Creatinine, Ser: 1.2 mg/dL (ref 0.4–1.2)

## 2012-09-16 LAB — BRAIN NATRIURETIC PEPTIDE: Pro B Natriuretic peptide (BNP): 338 pg/mL — ABNORMAL HIGH (ref 0.0–100.0)

## 2012-09-20 ENCOUNTER — Ambulatory Visit (INDEPENDENT_AMBULATORY_CARE_PROVIDER_SITE_OTHER): Payer: Medicare Other | Admitting: *Deleted

## 2012-09-20 ENCOUNTER — Other Ambulatory Visit (INDEPENDENT_AMBULATORY_CARE_PROVIDER_SITE_OTHER): Payer: Medicare Other

## 2012-09-20 DIAGNOSIS — I4891 Unspecified atrial fibrillation: Secondary | ICD-10-CM

## 2012-09-20 DIAGNOSIS — I5022 Chronic systolic (congestive) heart failure: Secondary | ICD-10-CM

## 2012-09-20 DIAGNOSIS — I10 Essential (primary) hypertension: Secondary | ICD-10-CM

## 2012-09-20 LAB — BASIC METABOLIC PANEL
Calcium: 9.3 mg/dL (ref 8.4–10.5)
GFR: 55.81 mL/min — ABNORMAL LOW (ref >60.00)
Potassium: 4.1 mEq/L (ref 3.5–5.1)
Sodium: 139 mEq/L (ref 135–145)

## 2012-09-20 LAB — POCT INR: INR: 1.3

## 2012-09-24 ENCOUNTER — Other Ambulatory Visit: Payer: Self-pay | Admitting: Family Medicine

## 2012-09-26 ENCOUNTER — Ambulatory Visit (INDEPENDENT_AMBULATORY_CARE_PROVIDER_SITE_OTHER): Payer: Medicare Other | Admitting: Pharmacist

## 2012-09-26 ENCOUNTER — Other Ambulatory Visit: Payer: Self-pay | Admitting: *Deleted

## 2012-09-26 DIAGNOSIS — I4891 Unspecified atrial fibrillation: Secondary | ICD-10-CM

## 2012-09-26 LAB — POCT INR: INR: 2.4

## 2012-09-26 MED ORDER — TRAMADOL HCL 50 MG PO TABS: 50.0000 mg | ORAL_TABLET | Freq: Four times a day (QID) | ORAL | Status: DC | PRN

## 2012-09-26 MED ORDER — TRAMADOL HCL 50 MG PO TABS: 50.0000 mg | ORAL_TABLET | Freq: Two times a day (BID) | ORAL | Status: DC

## 2012-09-26 NOTE — Telephone Encounter (Signed)
Called in Tramadol 50 mg 2 pills QD.  Patient was given a 30 day refill and was indicated to setup an appt for more refills. PG

## 2012-09-27 ENCOUNTER — Telehealth: Payer: Self-pay | Admitting: *Deleted

## 2012-09-27 NOTE — Telephone Encounter (Signed)
pt notified about lab results with verbal understanding  

## 2012-09-27 NOTE — Telephone Encounter (Signed)
Message copied by Tarri Fuller on Tue Sep 27, 2012  2:37 PM ------      Message from: [place], [place] T      Created: Sat Sep 24, 2012  8:52 PM       Potassium and kidney function ok      Hgb stable      Continue with current treatment plan.      Tereso Newcomer, PA-C        09/24/2012 8:52 PM ------

## 2012-10-03 ENCOUNTER — Other Ambulatory Visit: Payer: Self-pay | Admitting: Family Medicine

## 2012-10-03 ENCOUNTER — Ambulatory Visit (INDEPENDENT_AMBULATORY_CARE_PROVIDER_SITE_OTHER): Payer: Medicare Other | Admitting: *Deleted

## 2012-10-03 DIAGNOSIS — I4891 Unspecified atrial fibrillation: Secondary | ICD-10-CM

## 2012-10-03 LAB — POCT INR: INR: 3.5

## 2012-10-04 ENCOUNTER — Emergency Department (HOSPITAL_BASED_OUTPATIENT_CLINIC_OR_DEPARTMENT_OTHER): Payer: Medicare Other

## 2012-10-04 ENCOUNTER — Encounter (HOSPITAL_BASED_OUTPATIENT_CLINIC_OR_DEPARTMENT_OTHER): Payer: Self-pay | Admitting: *Deleted

## 2012-10-04 ENCOUNTER — Emergency Department (HOSPITAL_BASED_OUTPATIENT_CLINIC_OR_DEPARTMENT_OTHER)
Admission: EM | Admit: 2012-10-04 | Discharge: 2012-10-04 | Disposition: A | Discharge status: Home / Self Care | Payer: Medicare Other | Attending: Emergency Medicine | Admitting: Emergency Medicine

## 2012-10-04 DIAGNOSIS — Z87891 Personal history of nicotine dependence: Secondary | ICD-10-CM | POA: Insufficient documentation

## 2012-10-04 DIAGNOSIS — F329 Major depressive disorder, single episode, unspecified: Secondary | ICD-10-CM | POA: Insufficient documentation

## 2012-10-04 DIAGNOSIS — M7989 Other specified soft tissue disorders: Secondary | ICD-10-CM | POA: Insufficient documentation

## 2012-10-04 DIAGNOSIS — Z90710 Acquired absence of both cervix and uterus: Secondary | ICD-10-CM | POA: Insufficient documentation

## 2012-10-04 DIAGNOSIS — Z8739 Personal history of other diseases of the musculoskeletal system and connective tissue: Secondary | ICD-10-CM | POA: Insufficient documentation

## 2012-10-04 DIAGNOSIS — K219 Gastro-esophageal reflux disease without esophagitis: Secondary | ICD-10-CM | POA: Insufficient documentation

## 2012-10-04 DIAGNOSIS — Z862 Personal history of diseases of the blood and blood-forming organs and certain disorders involving the immune mechanism: Secondary | ICD-10-CM | POA: Insufficient documentation

## 2012-10-04 DIAGNOSIS — I2510 Atherosclerotic heart disease of native coronary artery without angina pectoris: Secondary | ICD-10-CM | POA: Insufficient documentation

## 2012-10-04 DIAGNOSIS — M67919 Unspecified disorder of synovium and tendon, unspecified shoulder: Secondary | ICD-10-CM | POA: Insufficient documentation

## 2012-10-04 DIAGNOSIS — Z8639 Personal history of other endocrine, nutritional and metabolic disease: Secondary | ICD-10-CM | POA: Insufficient documentation

## 2012-10-04 DIAGNOSIS — Z950 Presence of cardiac pacemaker: Secondary | ICD-10-CM | POA: Insufficient documentation

## 2012-10-04 DIAGNOSIS — M7552 Bursitis of left shoulder: Secondary | ICD-10-CM

## 2012-10-04 DIAGNOSIS — Z8719 Personal history of other diseases of the digestive system: Secondary | ICD-10-CM | POA: Insufficient documentation

## 2012-10-04 DIAGNOSIS — M719 Bursopathy, unspecified: Secondary | ICD-10-CM | POA: Insufficient documentation

## 2012-10-04 DIAGNOSIS — Z79899 Other long term (current) drug therapy: Secondary | ICD-10-CM | POA: Insufficient documentation

## 2012-10-04 DIAGNOSIS — Z9889 Other specified postprocedural states: Secondary | ICD-10-CM | POA: Insufficient documentation

## 2012-10-04 DIAGNOSIS — I5020 Unspecified systolic (congestive) heart failure: Secondary | ICD-10-CM | POA: Insufficient documentation

## 2012-10-04 DIAGNOSIS — I10 Essential (primary) hypertension: Secondary | ICD-10-CM | POA: Insufficient documentation

## 2012-10-04 DIAGNOSIS — Z7901 Long term (current) use of anticoagulants: Secondary | ICD-10-CM | POA: Insufficient documentation

## 2012-10-04 DIAGNOSIS — F3289 Other specified depressive episodes: Secondary | ICD-10-CM | POA: Insufficient documentation

## 2012-10-04 DIAGNOSIS — Z8679 Personal history of other diseases of the circulatory system: Secondary | ICD-10-CM | POA: Insufficient documentation

## 2012-10-04 DIAGNOSIS — IMO0001 Reserved for inherently not codable concepts without codable children: Secondary | ICD-10-CM | POA: Insufficient documentation

## 2012-10-04 MED ORDER — HYDROCODONE-ACETAMINOPHEN 5-325 MG PO TABS
2.0000 | ORAL_TABLET | Freq: Once | ORAL | Status: AC
  Administered 2012-10-04: 2 via ORAL
  Filled 2012-10-04: qty 2

## 2012-10-04 MED ORDER — HYDROCODONE-ACETAMINOPHEN 5-325 MG PO TABS: 2.0000 | ORAL_TABLET | ORAL | Status: DC | PRN

## 2012-10-04 NOTE — ED Notes (Signed)
Pt has raised area to left shoulder area, no redness or drainage noted.

## 2012-10-04 NOTE — ED Notes (Signed)
C/o left upper arm pain, no known injury

## 2012-10-04 NOTE — ED Notes (Signed)
Patient transported to X-ray 

## 2012-10-04 NOTE — ED Provider Notes (Signed)
CSN: [id number]     Arrival date & time 10/04/12  1913 History   First MD Initiated Contact with Patient 10/04/12 2018     Chief Complaint  Patient presents with  . Arm Pain   (Consider location/radiation/quality/duration/timing/severity/associated sxs/prior Treatment) Patient is a 79 y.o. female presenting with arm pain. The history is provided by the patient. No language interpreter was used.  Arm Pain This is a new problem. The current episode started in the past 7 days (1 week). The problem occurs constantly. The problem has been gradually worsening. Associated symptoms include joint swelling and myalgias. Nothing aggravates the symptoms. She has tried nothing for the symptoms. The treatment provided moderate relief.  Pt complains of pain in her left shoulder.  Pt reports a knot has come up over the last week.  Pt complains of pain in this area  Past Medical History  Diagnosis Date  . HTN (hypertension)   . Hernia     Incisional hernia, umbilicus.  . H/O: hysterectomy   . CHB (complete heart block)     s/p pacer (Tenant => GT) => upgrade to BiV pacer  . Atrial fibrillation     on xarleto  . Depression   . Goiter   . DJD (degenerative joint disease)   . GERD (gastroesophageal reflux disease)   . Diverticulosis   . Cardiomyopathy secondary     Myoview 3/13: small inf scar + mild peri-infarct isch; EF 32%;     [name] CAD (coronary artery disease)     LHC 05/12/11: pLAD 20%, pCFX 20%, mid 30%, m+dRCA with mild lum irregs; EF 35% mid inf wal and inf-apical HK(no culprit for segmental WMA);    [name] Systolic CHF     Echo 05/2012: Sept dyssynergy from pacing, global HK, inf wall moves less well, mod LVH, EF 45%, diast dysfn, MVP of ant leaflet with mod MR post directed, mild BAE, mod to sev TR, PASP 75  . Pacemaker     upgrade to BiV PPM 02/19/12 (LV lead off)  . H/O hiatal hernia   . LBBB (left bundle branch block)   . Iron deficiency anemia, unspecified 06/07/2012  . Angiodysplasia of stomach  06/09/2012    a. admx with syncope and heme + anemia;  angidysplasia tx with ablation with APC (GI recs ok to resume Xarelto after 5 days)   Past Surgical History  Procedure Laterality Date  . Pacemaker placement  , 2002.  [name] Cholecystectomy    . Insert / replace / remove pacemaker    . Abdominal hysterectomy    . Tubal ligation    . Small intestine surgery    . Cardiac catheterization    . Epicardial pacing lead placement Left 03/17/2012    Procedure: EPICARDIAL PACING LEAD PLACEMENT;  Surgeon: [name], MD;  Location: [facility] OR;  Service: Thoracic;  Laterality: Left;  placement of LV epicardial leads  . Esophagogastroduodenoscopy N/A 06/09/2012    Procedure: ESOPHAGOGASTRODUODENOSCOPY (EGD);  Surgeon: Iva Boop, MD;  Location: [facility];  Service: Endoscopy;  Laterality: N/A;  . Colonoscopy N/A 06/09/2012    Procedure: COLONOSCOPY;  Surgeon: Iva Boop, MD;  Location: [facility];  Service: Endoscopy;  Laterality: N/A;  . Hot hemostasis N/A 06/09/2012    Procedure: HOT HEMOSTASIS (ARGON PLASMA COAGULATION/BICAP);  Surgeon: Iva Boop, MD;  Location: [facility];  Service: Endoscopy;  Laterality: N/A;   Family History  Problem Relation Age of Onset  . Adopted: Yes  . Coronary artery disease    .  Other      stomach carcinoma  . Stomach cancer Other   . Coronary artery disease Other   . Cancer Mother    History  Substance Use Topics  . Smoking status: Former Smoker    Types: Cigarettes    Quit date: 01/21/2009  . Smokeless tobacco: Former Neurosurgeon    Quit date: 01/21/2009  . Alcohol Use: No   OB History   Grav Para Term Preterm Abortions TAB SAB Ect Mult Living                 Review of Systems  Musculoskeletal: Positive for myalgias and joint swelling.  All other systems reviewed and are negative.    Allergies  Review of patient's allergies indicates no known allergies.  Home Medications   Current Outpatient Rx  Name  Route  Sig  Dispense  Refill  .  ADVAIR DISKUS 250-50 MCG/DOSE AEPB   Inhalation   Inhale 1 puff into the lungs 2 (two) times daily.          [name] amLODipine (NORVASC) 10 MG tablet   Oral   Take 1 tablet (10 mg total) by mouth daily.   30 tablet   0   . atorvastatin (LIPITOR) 40 MG tablet   Oral   Take 20 mg by mouth every morning. Take 1/2 - 1 tab daily         . carvedilol (COREG) 25 MG tablet   Oral   Take 25 mg by mouth 2 (two) times daily with a meal.           . COMBIVENT RESPIMAT 20-100 MCG/ACT AERS respimat   Inhalation   Inhale 1 puff into the lungs every 6 (six) hours as needed for wheezing or shortness of breath.          . cyclobenzaprine (FLEXERIL) 5 MG tablet   Oral   Take 5 mg by mouth at bedtime. Take 1 tab po q hs         . DULoxetine (CYMBALTA) 30 MG capsule   Oral   Take 30 mg by mouth every morning.          . furosemide (LASIX) 20 MG tablet   Oral   Take 3 tablets (60 mg total) by mouth 2 (two) times daily.   90 tablet   5   . irbesartan (AVAPRO) 150 MG tablet   Oral   Take 150 mg by mouth every morning.         . lansoprazole (PREVACID) 15 MG capsule   Oral   Take 15 mg by mouth every morning.          . potassium chloride (K-DUR) 10 MEQ tablet      TAKE 4 TABLETS 40MG  IN THE AM AND 2 TABLETS 20MG  IN THE PM   90 tablet   5   . tiotropium (SPIRIVA HANDIHALER) 18 MCG inhalation capsule   Inhalation   Place 1 capsule (18 mcg total) into inhaler and inhale daily.   30 capsule   11   . traMADol (ULTRAM) 50 MG tablet   Oral   Take 1 tablet (50 mg total) by mouth 2 (two) times daily.   30 tablet   0   . warfarin (COUMADIN) 5 MG tablet   Oral   Take 5 mg by mouth daily. TAKE 5 MG 1 TABLET DAILY MON-FRI TAKE 2.5MG  1/2TABLET SAT & SUN          BP 166/65  Pulse 70  Temp(Src) 98.9 F (37.2 C) (Oral)  Resp 18  Ht 5\' 9"  (1.753 m)  Wt 200 lb (90.719 kg)  BMI 29.52 kg/m2  SpO2 100% Physical Exam  Nursing note and vitals reviewed. Constitutional: She  is oriented to person, place, and time. She appears well-developed and well-nourished.  Musculoskeletal: She exhibits tenderness.  2cm swollen area left shoulder,  Feels like a bursa or cyst  Neurological: She is alert and oriented to person, place, and time. She has normal reflexes.  Skin: Skin is warm.  Psychiatric: She has a normal mood and affect.    ED Course  Procedures (including critical care time) Labs Review Labs Reviewed - No data to display Imaging Review Dg Shoulder Left  10/04/2012   *RADIOLOGY REPORT*  Clinical Data: Left shoulder pain, evaluate for abscess involving the head of the humerus  LEFT SHOULDER - 2+ VIEW  Comparison: Chest radiograph - 08/26/2012  Findings:  The anterior projection radiograph is degraded secondary to obscuration due to the overlying the left anterior chest wall pacemaker power pack.  No definite fracture or dislocation.  There is apparent soft tissue swelling about the superior aspect of the acromion.  No subcutaneous emphysema.  No discrete area of osteolysis to suggest osteomyelitis.  No radiopaque foreign body.  The humerus is noted to be slightly high-riding in relation to the acromion with associated sclerosis suggestive of rotator cuff pathology/tear.  Several loose bodies are suspected within the anterior aspect of the glenohumeral joint space. Mild degenerative change of the [facility] joint.  Limited visualization of the adjacent thorax is normal.  IMPRESSION: 1.  Nonspecific soft tissue swelling about the cranial aspect of the acromion without associated fracture, radiopaque foreign body or plain film evidence of osteomyelitis. 2.  Findings suggestive of left-sided rotator cuff tear.   Original Report Authenticated By: Tacey Ruiz, MD    MDM   1. Bursitis of shoulder, left    Pt given 2 hydrocodone for pain.   Pt advised to see Dr. Pearletha Forge for evaluation.   Ice to area.     Lonia Skinner [place], PA-C 10/04/12 2155

## 2012-10-05 NOTE — ED Provider Notes (Signed)
Medical screening examination/treatment/procedure(s) were performed by non-physician practitioner and as supervising physician I was immediately available for consultation/collaboration.   Audree Camel, MD 10/05/12 1146

## 2012-10-08 ENCOUNTER — Other Ambulatory Visit: Payer: Self-pay | Admitting: Family Medicine

## 2012-10-13 ENCOUNTER — Encounter: Payer: Self-pay | Admitting: Internal Medicine

## 2012-10-13 ENCOUNTER — Ambulatory Visit (INDEPENDENT_AMBULATORY_CARE_PROVIDER_SITE_OTHER): Payer: Medicare Other | Admitting: *Deleted

## 2012-10-13 DIAGNOSIS — I4891 Unspecified atrial fibrillation: Secondary | ICD-10-CM

## 2012-10-13 LAB — POCT INR: INR: 3.7

## 2012-10-27 ENCOUNTER — Ambulatory Visit (INDEPENDENT_AMBULATORY_CARE_PROVIDER_SITE_OTHER): Payer: Medicare Other | Admitting: *Deleted

## 2012-10-27 DIAGNOSIS — I4891 Unspecified atrial fibrillation: Secondary | ICD-10-CM

## 2012-11-02 ENCOUNTER — Other Ambulatory Visit: Payer: Self-pay | Admitting: Family Medicine

## 2012-11-04 ENCOUNTER — Ambulatory Visit (INDEPENDENT_AMBULATORY_CARE_PROVIDER_SITE_OTHER): Payer: Medicare Other | Admitting: Family Medicine

## 2012-11-04 VITALS — BP 108/75 | HR 70 | Resp 16 | Wt 200.0 lb

## 2012-11-04 DIAGNOSIS — R609 Edema, unspecified: Secondary | ICD-10-CM

## 2012-11-04 DIAGNOSIS — I5022 Chronic systolic (congestive) heart failure: Secondary | ICD-10-CM

## 2012-11-04 DIAGNOSIS — K219 Gastro-esophageal reflux disease without esophagitis: Secondary | ICD-10-CM

## 2012-11-04 DIAGNOSIS — G894 Chronic pain syndrome: Secondary | ICD-10-CM

## 2012-11-04 DIAGNOSIS — I10 Essential (primary) hypertension: Secondary | ICD-10-CM

## 2012-11-04 DIAGNOSIS — Z23 Encounter for immunization: Secondary | ICD-10-CM

## 2012-11-04 MED ORDER — DULOXETINE HCL 60 MG PO CPEP: 60.0000 mg | ORAL_CAPSULE | Freq: Every morning | ORAL | Status: DC

## 2012-11-04 MED ORDER — AMLODIPINE BESYLATE 10 MG PO TABS: ORAL_TABLET | ORAL | Status: DC

## 2012-11-04 MED ORDER — CARVEDILOL 25 MG PO TABS: 25.0000 mg | ORAL_TABLET | Freq: Two times a day (BID) | ORAL | Status: DC

## 2012-11-04 MED ORDER — TRAMADOL HCL 50 MG PO TABS: 50.0000 mg | ORAL_TABLET | Freq: Four times a day (QID) | ORAL | Status: DC | PRN

## 2012-11-04 MED ORDER — FUROSEMIDE 40 MG PO TABS: ORAL_TABLET | ORAL | Status: DC

## 2012-11-04 MED ORDER — PANTOPRAZOLE SODIUM 40 MG PO TBEC: 40.0000 mg | DELAYED_RELEASE_TABLET | Freq: Every day | ORAL | Status: AC

## 2012-11-04 NOTE — Progress Notes (Signed)
Subjective:    Patient ID: [name], female    DOB: 1932-09-26, 79 y.o.   MRN: [id number]  HPI  [name] is here today to discuss the conditions listed below:    1)  Hypertension:  Her blood pressure continues to be controlled with her ibersartan 150 mg and amlodipine 10 mg.  She needs a refill of her amlodipine.   2)  Myalgia:  She is doing well with her Cymbalta 60 mg.  She needs a refill on it.   3)  Shoulder Pain:  She needs a refill on her tramadol.      Review of Systems  Constitutional: Negative.   HENT: Negative.   Eyes: Negative.   Respiratory: Negative.   Cardiovascular: Negative.   Gastrointestinal: Negative.   Endocrine: Negative.   Genitourinary: Negative.   Musculoskeletal:       Shoulder pain.    Skin: Negative.   Allergic/Immunologic: Negative.   Neurological: Negative.   Hematological: Negative.   Psychiatric/Behavioral: Negative.      Past Medical History  Diagnosis Date  . HTN (hypertension)   . Hernia     Incisional hernia, umbilicus.  . H/O: hysterectomy   . CHB (complete heart block)     s/p pacer (Tenant => GT) => upgrade to BiV pacer  . Atrial fibrillation     on xarleto  . Depression   . Goiter   . DJD (degenerative joint disease)   . GERD (gastroesophageal reflux disease)   . Diverticulosis   . Cardiomyopathy secondary     Myoview 3/13: small inf scar + mild peri-infarct isch; EF 32%;     [name] CAD (coronary artery disease)     LHC 05/12/11: pLAD 20%, pCFX 20%, mid 30%, m+dRCA with mild lum irregs; EF 35% mid inf wal and inf-apical HK(no culprit for segmental WMA);    [name] Systolic CHF     Echo 05/2012: Sept dyssynergy from pacing, global HK, inf wall moves less well, mod LVH, EF 45%, diast dysfn, MVP of ant leaflet with mod MR post directed, mild BAE, mod to sev TR, PASP 75  . Pacemaker     upgrade to BiV PPM 02/19/12 (LV lead off)  . H/O hiatal hernia   . LBBB (left bundle branch block)   . Iron deficiency anemia, unspecified 06/07/2012  .  Angiodysplasia of stomach 06/09/2012    a. admx with syncope and heme + anemia;  angidysplasia tx with ablation with APC (GI recs ok to resume Xarelto after 5 days)     Family History  Problem Relation Age of Onset  . Adopted: Yes  . Coronary artery disease    . Other      stomach carcinoma  . Stomach cancer Other   . Coronary artery disease Other   . Cancer Mother      History   Social History Narrative   Marital Status:  Married [name])   Children:  3 (Daughter 1955,  Son 30, Son 1964)   Living Situation: Lives with spouse in a one-level home with 3 steps to enter.  They moved to [facility] from [place] since [name] is from this area.     Occupation: Retired Proofreader)   Education: Associates Degree   Tobacco Use/Exposure: 3 packs per week for > 50 years; She quit smoking in 2010     Alcohol Use: Occasional   Drug Use:  None   Diet:  Healthy   Exercise:  None   Hobbies:  [employer], Knitting, Reading,  Sewing                          Objective:   Physical Exam  Vitals reviewed. Constitutional: She is oriented to person, place, and time. She appears well-developed and well-nourished.  Cardiovascular: Normal rate and regular rhythm.   Pulmonary/Chest: Effort normal and breath sounds normal.  Neurological: She is alert and oriented to person, place, and time.  Skin: Skin is warm and dry.  Psychiatric: She has a normal mood and affect.     Assessment & Plan:    [name] was seen today for medication management.  Diagnoses and associated orders for this visit:  Chronic systolic heart failure - carvedilol (COREG) 25 MG tablet; Take 1 tablet (25 mg total) by mouth 2 (two) times daily with a meal. - furosemide (LASIX) 40 MG tablet; Take 2-3 tabs per day for swelling  GERD (gastroesophageal reflux disease) - pantoprazole (PROTONIX) 40 MG tablet; Take 1 tablet (40 mg total) by mouth daily.  Essential hypertension, benign Comments: Her BP is low so she is to decrease  but her dosage of amlodipine from 10 mg to 5 mg.   - amLODipine (NORVASC) 10 MG tablet; Take 1/2 - 1 tab po daily for blood pressure  Chronic pain syndrome Comments: This continues to be an ongoing problem.  I have recommended that she let us set her up with a pain clinic.  Since tramadol has become more controlled, I would like to lower her dosage of this and have her  take a combination of Ultram, Tylenol, Celebrex and Cymbalta.  She is to run the Celebrex by her cardiologist.   - DULoxetine (CYMBALTA) 60 MG capsule; Take 1 capsule (60 mg total) by mouth every morning. - traMADol (ULTRAM) 50 MG tablet; Take 1 tablet (50 mg total) by mouth every 6 (six) hours as needed for pain.  Edema Comments: This should improve by lowering her amlodipine.   - furosemide (LASIX) 40 MG tablet; Take 2-3 tabs per day for swelling  Need for prophylactic vaccination and inoculation against influenza - Flu Vaccine QUAD 36+ mos PF IM (Fluarix)

## 2012-11-04 NOTE — Patient Instructions (Addendum)
1)  Blood Pressure:  Your pressure is even lower than it needs to be so we are going to decrease your amlodipine a little to see if this will decrease your leg swelling.  You are currently taking 10 mg so cut that pill in 1/2 and only take 5 mg.  Stay on the Coreg twice a day and the irvesartan 150 mg.   2)  Leg Swelling:  This should improve with dropping the dosage of the amlodipine.  I don't think you are going to need as much Lasix so I have changed the medication to 40 mg.  The bottle says take 2-3.  Start with 1 tab twice a day and see how you do.  If you have days where you have extra swelling then you can take 3.  Take your potassium 1-2 tabs every time you take the Lasix.  Start with 1 and go to 2 if you start to get leg cramps.   You might consider going to a place in [place] called [facility] [facility] that makes really good compression stockings.  Once you get your legs down pretty small then go get you a couple of new pairs to help keep them down.    3)  Mood/Pain - Stay on the Cymbalta 60 mg  4)  Pain - Take Tylenol 1000 mg up to 3 times per day plus the tramadol 50 mg up to 4 times per day.  You might benefit from Celebrex 200 mg daily.  Ask your cardiologist is they mind if you take it.  You are supposed to avoid anti-inflammatory medication because of the Coumadin but... It is technically approved for use with Coumadin.      Edema Edema is an abnormal build-up of fluids in tissues. Because this is partly dependent on gravity (water flows to the lowest place), it is more common in the legs and thighs (lower extremities). It is also common in the looser tissues, like around the eyes. Painless swelling of the feet and ankles is common and increases as a person ages. It may affect both legs and may include the calves or even thighs. When squeezed, the fluid may move out of the affected area and may leave a dent for a few moments. CAUSES   Prolonged standing or sitting in one place for extended  periods of time. Movement helps pump tissue fluid into the veins, and absence of movement prevents this, resulting in edema.  Varicose veins. The valves in the veins do not work as well as they should. This causes fluid to leak into the tissues.  Fluid and salt overload.  Injury, burn, or surgery to the leg, ankle, or foot, may damage veins and allow fluid to leak out.  Sunburn damages vessels. Leaky vessels allow fluid to go out into the sunburned tissues.  Allergies (from insect bites or stings, medications or chemicals) cause swelling by allowing vessels to become leaky.  Protein in the blood helps keep fluid in your vessels. Low protein, as in malnutrition, allows fluid to leak out.  Hormonal changes, including pregnancy and menstruation, cause fluid retention. This fluid may leak out of vessels and cause edema.  Medications that cause fluid retention. Examples are sex hormones, blood pressure medications, steroid treatment, or anti-depressants.  Some illnesses cause edema, especially heart failure, kidney disease, or liver disease.  Surgery that cuts veins or lymph nodes, such as surgery done for the heart or for breast cancer, may result in edema. DIAGNOSIS  Your caregiver is  usually easily able to determine what is causing your swelling (edema) by simply asking what is wrong (getting a history) and examining you (doing a physical). Sometimes x-rays, EKG (electrocardiogram or heart tracing), and blood work may be done to evaluate for underlying medical illness. TREATMENT  General treatment includes:  Leg elevation (or elevation of the affected body part).  Restriction of fluid intake.  Prevention of fluid overload.  Compression of the affected body part. Compression with elastic bandages or support stockings squeezes the tissues, preventing fluid from entering and forcing it back into the blood vessels.  Diuretics (also called water pills or fluid pills) pull fluid out of your  body in the form of increased urination. These are effective in reducing the swelling, but can have side effects and must be used only under your caregiver's supervision. Diuretics are appropriate only for some types of edema. The specific treatment can be directed at any underlying causes discovered. Heart, liver, or kidney disease should be treated appropriately. HOME CARE INSTRUCTIONS   Elevate the legs (or affected body part) above the level of the heart, while lying down.  Avoid sitting or standing still for prolonged periods of time.  Avoid putting anything directly under the knees when lying down, and do not wear constricting clothing or garters on the upper legs.  Exercising the legs causes the fluid to work back into the veins and lymphatic channels. This may help the swelling go down.  The pressure applied by elastic bandages or support stockings can help reduce ankle swelling.  A low-salt diet may help reduce fluid retention and decrease the ankle swelling.  Take any medications exactly as prescribed. SEEK MEDICAL CARE IF:  Your edema is not responding to recommended treatments. SEEK IMMEDIATE MEDICAL CARE IF:   You develop shortness of breath or chest pain.  You cannot breathe when you lay down; or if, while lying down, you have to get up and go to the window to get your breath.  You are having increasing swelling without relief from treatment.  You develop a fever over 102 F (38.9 C).  You develop pain or redness in the areas that are swollen.  Tell your caregiver right away if you have gained 3 lb/1.4 kg in 1 day or 5 lb/2.3 kg in a week. MAKE SURE YOU:   Understand these instructions.  Will watch your condition.  Will get help right away if you are not doing well or get worse. Document Released: 01/26/2005 Document Revised: 07/28/2011 Document Reviewed: 09/14/2007 [facility] Patient Information 2014 [place], [place].

## 2012-11-10 ENCOUNTER — Encounter: Payer: Self-pay | Admitting: Internal Medicine

## 2012-11-10 ENCOUNTER — Ambulatory Visit (INDEPENDENT_AMBULATORY_CARE_PROVIDER_SITE_OTHER): Payer: Medicare Other | Admitting: Internal Medicine

## 2012-11-10 ENCOUNTER — Ambulatory Visit (INDEPENDENT_AMBULATORY_CARE_PROVIDER_SITE_OTHER): Payer: Medicare Other | Admitting: *Deleted

## 2012-11-10 VITALS — BP 124/62 | HR 71 | Ht 69.0 in | Wt 202.0 lb

## 2012-11-10 DIAGNOSIS — I428 Other cardiomyopathies: Secondary | ICD-10-CM

## 2012-11-10 DIAGNOSIS — I5022 Chronic systolic (congestive) heart failure: Secondary | ICD-10-CM

## 2012-11-10 DIAGNOSIS — I4891 Unspecified atrial fibrillation: Secondary | ICD-10-CM

## 2012-11-10 DIAGNOSIS — I429 Cardiomyopathy, unspecified: Secondary | ICD-10-CM

## 2012-11-10 DIAGNOSIS — Z950 Presence of cardiac pacemaker: Secondary | ICD-10-CM

## 2012-11-10 LAB — PACEMAKER DEVICE OBSERVATION
BATTERY VOLTAGE: 2.9178 V
LV LEAD THRESHOLD: 1.75 V
RV LEAD AMPLITUDE: 9 mv
RV LEAD THRESHOLD: 1 V
VENTRICULAR PACING PM: 99

## 2012-11-10 LAB — POCT INR: INR: 2.1

## 2012-11-10 NOTE — Progress Notes (Signed)
HPI [name] returns today for followup. She is a pleasant 79 yo woman with a h/o chronic atrial fibrillation and CHB. She is s/p failed BiV PPM insertion with subsequent epicardial LV lead placement. Her congestive heart failure symptoms have gone from class IIIB to class IIA. She is much improved. Her appetite has gotten better. She denies chest pain. No syncope. Minimal peripheral edema. No Known Allergies   Current Outpatient Prescriptions  Medication Sig Dispense Refill  . ADVAIR DISKUS 250-50 MCG/DOSE AEPB Inhale 1 puff into the lungs 2 (two) times daily.       [name] amLODipine (NORVASC) 10 MG tablet Take 1/2 - 1 tab po daily for blood pressure  120 tablet  1  . atorvastatin (LIPITOR) 40 MG tablet Take 20 mg by mouth every morning. Take 1/2 - 1 tab daily      . carvedilol (COREG) 25 MG tablet Take 1 tablet (25 mg total) by mouth 2 (two) times daily with a meal.  240 tablet  2  . COMBIVENT RESPIMAT 20-100 MCG/ACT AERS respimat Inhale 1 puff into the lungs every 6 (six) hours as needed for wheezing or shortness of breath.       . cyclobenzaprine (FLEXERIL) 5 MG tablet Take 5 mg by mouth at bedtime. Take 1 tab po q hs      . DULoxetine (CYMBALTA) 60 MG capsule Take 1 capsule (60 mg total) by mouth every morning.  30 capsule  5  . furosemide (LASIX) 40 MG tablet Take 2-3 tabs per day for swelling  360 tablet  2  . HYDROcodone-acetaminophen (NORCO/VICODIN) 5-325 MG per tablet Take 1 tablet by mouth as needed.      . irbesartan (AVAPRO) 150 MG tablet Take 150 mg by mouth every morning.      . lansoprazole (PREVACID) 15 MG capsule Take 15 mg by mouth every morning.       . pantoprazole (PROTONIX) 40 MG tablet Take 1 tablet (40 mg total) by mouth daily.  180 tablet  1  . potassium chloride (K-DUR) 10 MEQ tablet TAKE 4 TABLETS 40MG  IN THE AM AND 2 TABLETS 20MG  IN THE PM  90 tablet  5  . tiotropium (SPIRIVA HANDIHALER) 18 MCG inhalation capsule Place 1 capsule (18 mcg total) into inhaler  and inhale daily.  30 capsule  11  . traMADol (ULTRAM) 50 MG tablet Take 1 tablet (50 mg total) by mouth every 6 (six) hours as needed for pain.  120 tablet  5  . warfarin (COUMADIN) 5 MG tablet Take 5 mg by mouth daily. TAKE 5 MG 1 TABLET DAILY MON-FRI TAKE 2.5MG  1/2TABLET SAT & SUN       No current facility-administered medications for this visit.     Past Medical History  Diagnosis Date  . HTN (hypertension)   . Hernia     Incisional hernia, umbilicus.  . H/O: hysterectomy   . CHB (complete heart block)     s/p pacer (Tenant => GT) => upgrade to BiV pacer  . Atrial fibrillation     on xarleto  . Depression   . Goiter   . DJD (degenerative joint disease)   . GERD (gastroesophageal reflux disease)   . Diverticulosis   . Cardiomyopathy secondary     Myoview 3/13: small inf scar + mild peri-infarct isch; EF 32%;     [name] CAD (coronary artery disease)     LHC 05/12/11: pLAD 20%, pCFX 20%, mid 30%, m+dRCA with mild  lum irregs; EF 35% mid inf wal and inf-apical HK(no culprit for segmental WMA);    [name] Systolic CHF     Echo 05/2012: Sept dyssynergy from pacing, global HK, inf wall moves less well, mod LVH, EF 45%, diast dysfn, MVP of ant leaflet with mod MR post directed, mild BAE, mod to sev TR, PASP 75  . Pacemaker     upgrade to BiV PPM 02/19/12 (LV lead off)  . H/O hiatal hernia   . LBBB (left bundle branch block)   . Iron deficiency anemia, unspecified 06/07/2012  . Angiodysplasia of stomach 06/09/2012    a. admx with syncope and heme + anemia;  angidysplasia tx with ablation with APC (GI recs ok to resume Xarelto after 5 days)    ROS:   All systems reviewed and negative except as noted in the HPI.   Past Surgical History  Procedure Laterality Date  . Pacemaker placement  , 2002.  [name] Cholecystectomy    . Insert / replace / remove pacemaker    . Abdominal hysterectomy    . Tubal ligation    . Small intestine surgery    . Cardiac catheterization    . Epicardial pacing lead  placement Left 03/17/2012    Procedure: EPICARDIAL PACING LEAD PLACEMENT;  Surgeon: [name], MD;  Location: [facility];  Service: Thoracic;  Laterality: Left;  placement of LV epicardial leads  . Esophagogastroduodenoscopy N/A 06/09/2012    Procedure: ESOPHAGOGASTRODUODENOSCOPY (EGD);  Surgeon: Iva Boop, MD;  Location: [facility];  Service: Endoscopy;  Laterality: N/A;  . Colonoscopy N/A 06/09/2012    Procedure: COLONOSCOPY;  Surgeon: Iva Boop, MD;  Location: [facility];  Service: Endoscopy;  Laterality: N/A;  . Hot hemostasis N/A 06/09/2012    Procedure: HOT HEMOSTASIS (ARGON PLASMA COAGULATION/BICAP);  Surgeon: Iva Boop, MD;  Location: [facility];  Service: Endoscopy;  Laterality: N/A;     Family History  Problem Relation Age of Onset  . Adopted: Yes  . Coronary artery disease    . Other      stomach carcinoma  . Stomach cancer Other   . Coronary artery disease Other   . Cancer Mother      History   Social History  . Marital Status: Married    Spouse Name: [name]    Number of Children: 3  . Years of Education: 14   Occupational History  .     Social History Main Topics  . Smoking status: Former Smoker    Types: Cigarettes    Quit date: 01/21/2009  . Smokeless tobacco: Former Neurosurgeon    Quit date: 01/21/2009  . Alcohol Use: No  . Drug Use: No  . Sexual Activity: No   Other Topics Concern  . Not on file   Social History Narrative   Marital Status:  Married [name])   Children:  3 (Daughter 1955,  Son 105, Son 1964)   Living Situation: Lives with spouse in a one-level home with 3 steps to enter.  They moved to [facility] from [place] since [name] is from this area.     Occupation: Retired Proofreader)   Education: Associates Degree   Tobacco Use/Exposure: 3 packs per week for > 50 years; She quit smoking in 2010     Alcohol Use: Occasional   Drug Use:  None   Diet:  Healthy   Exercise:  None   Hobbies:  Crossword Puzzles, Knitting, Reading, [address]  BP 124/62  Pulse 71  Ht 5\' 9"  (1.753 m)  Wt 202 lb (91.627 kg)  BMI 29.82 kg/m2  SpO2 98%  Physical Exam:  Well appearing 79 year old woman,NAD HEENT: Unremarkable Neck:  7 cm JVD, no thyromegally Back:  No CVA tenderness Lungs:  Clear with no wheezes, rales, or rhonchi. HEART:  Regular rate rhythm, no murmurs, no rubs, no clicks Abd:  soft, positive bowel sounds, no organomegally, no rebound, no guarding Ext:  2 plus pulses, no edema, no cyanosis, no clubbing Skin:  No rashes no nodules Neuro:  CN II through XII intact, motor grossly intact   DEVICE  Normal device function.  See PaceArt for details.   Assess/Plan:

## 2012-11-10 NOTE — Assessment & Plan Note (Signed)
Her St. Jude biventricular pacemaker is working normally now that she has an epicardial left ventricular lead. No changes to her pacemaker programming today.

## 2012-11-10 NOTE — Patient Instructions (Addendum)
Your physician wants you to follow-up in: 12 months with Dr. Taylor. You will receive a reminder letter in the mail two months in advance. If you don't receive a letter, please call our office to schedule the follow-up appointment.    

## 2012-11-10 NOTE — Assessment & Plan Note (Signed)
Her chronic systolic heart failure remains class II. She will continue her current medical therapy, and is instructed to increase her physical activity, and maintain a low-sodium diet.

## 2012-11-10 NOTE — Assessment & Plan Note (Signed)
Her ventricular rates are well-controlled. She will continue her chronic Coumadin therapy.

## 2012-11-24 ENCOUNTER — Ambulatory Visit (INDEPENDENT_AMBULATORY_CARE_PROVIDER_SITE_OTHER): Payer: Medicare Other | Admitting: *Deleted

## 2012-11-24 DIAGNOSIS — I4891 Unspecified atrial fibrillation: Secondary | ICD-10-CM

## 2012-11-24 LAB — POCT INR: INR: 3.9

## 2012-12-05 DIAGNOSIS — I10 Essential (primary) hypertension: Secondary | ICD-10-CM

## 2012-12-05 DIAGNOSIS — I509 Heart failure, unspecified: Secondary | ICD-10-CM

## 2012-12-12 ENCOUNTER — Ambulatory Visit (INDEPENDENT_AMBULATORY_CARE_PROVIDER_SITE_OTHER): Payer: Medicare Other | Admitting: *Deleted

## 2012-12-12 DIAGNOSIS — I4891 Unspecified atrial fibrillation: Secondary | ICD-10-CM

## 2012-12-12 LAB — POCT INR: INR: 2.7

## 2012-12-22 ENCOUNTER — Other Ambulatory Visit: Payer: Self-pay | Admitting: *Deleted

## 2012-12-22 MED ORDER — WARFARIN SODIUM 5 MG PO TABS: 5.0000 mg | ORAL_TABLET | Freq: Every day | ORAL | Status: DC

## 2012-12-30 ENCOUNTER — Ambulatory Visit (INDEPENDENT_AMBULATORY_CARE_PROVIDER_SITE_OTHER): Payer: Medicare Other | Admitting: Pharmacist

## 2012-12-30 DIAGNOSIS — I4891 Unspecified atrial fibrillation: Secondary | ICD-10-CM

## 2012-12-30 LAB — POCT INR: INR: 2.7

## 2013-01-09 ENCOUNTER — Encounter: Payer: Self-pay | Admitting: Family Medicine

## 2013-01-15 DIAGNOSIS — R609 Edema, unspecified: Secondary | ICD-10-CM | POA: Insufficient documentation

## 2013-01-15 DIAGNOSIS — Z23 Encounter for immunization: Secondary | ICD-10-CM | POA: Insufficient documentation

## 2013-01-15 DIAGNOSIS — G894 Chronic pain syndrome: Secondary | ICD-10-CM | POA: Insufficient documentation

## 2013-01-15 NOTE — Addendum Note (Signed)
Addended by: [name] on: 01/15/2013 07:41 PM   Modules accepted: Level of Service

## 2013-01-27 ENCOUNTER — Ambulatory Visit (INDEPENDENT_AMBULATORY_CARE_PROVIDER_SITE_OTHER): Payer: Medicare Other | Admitting: Pharmacist

## 2013-01-27 DIAGNOSIS — I4891 Unspecified atrial fibrillation: Secondary | ICD-10-CM

## 2013-01-27 DIAGNOSIS — Z7901 Long term (current) use of anticoagulants: Secondary | ICD-10-CM

## 2013-01-27 LAB — PROTIME-INR: Prothrombin Time: 72.9 s (ref 10.2–12.4)

## 2013-01-30 ENCOUNTER — Ambulatory Visit (INDEPENDENT_AMBULATORY_CARE_PROVIDER_SITE_OTHER): Payer: Medicare Other | Admitting: Pharmacist

## 2013-01-30 DIAGNOSIS — I4891 Unspecified atrial fibrillation: Secondary | ICD-10-CM

## 2013-01-30 LAB — POCT INR: INR: 4.2

## 2013-02-06 ENCOUNTER — Ambulatory Visit (INDEPENDENT_AMBULATORY_CARE_PROVIDER_SITE_OTHER): Payer: Medicare Other | Admitting: Pharmacist

## 2013-02-06 DIAGNOSIS — I4891 Unspecified atrial fibrillation: Secondary | ICD-10-CM

## 2013-02-06 LAB — POCT INR: INR: 1.4

## 2013-02-13 ENCOUNTER — Other Ambulatory Visit: Payer: Self-pay | Admitting: Physician Assistant

## 2013-02-14 ENCOUNTER — Encounter: Payer: Medicare Other | Admitting: *Deleted

## 2013-02-16 ENCOUNTER — Ambulatory Visit (INDEPENDENT_AMBULATORY_CARE_PROVIDER_SITE_OTHER): Payer: Medicare Other | Admitting: *Deleted

## 2013-02-16 DIAGNOSIS — I4891 Unspecified atrial fibrillation: Secondary | ICD-10-CM

## 2013-02-16 LAB — POCT INR: INR: 1.7

## 2013-02-22 ENCOUNTER — Encounter: Payer: Self-pay | Admitting: *Deleted

## 2013-03-02 ENCOUNTER — Ambulatory Visit (INDEPENDENT_AMBULATORY_CARE_PROVIDER_SITE_OTHER): Payer: Medicare Other | Admitting: *Deleted

## 2013-03-02 DIAGNOSIS — I4891 Unspecified atrial fibrillation: Secondary | ICD-10-CM

## 2013-03-02 DIAGNOSIS — I5023 Acute on chronic systolic (congestive) heart failure: Secondary | ICD-10-CM

## 2013-03-02 DIAGNOSIS — I509 Heart failure, unspecified: Secondary | ICD-10-CM

## 2013-03-02 LAB — MDC_IDC_ENUM_SESS_TYPE_INCLINIC
Battery Voltage: 2.9 V
Brady Statistic RA Percent Paced: 0 %
Brady Statistic RV Percent Paced: 99 %
Date Time Interrogation Session: 20150122123512
Lead Channel Pacing Threshold Amplitude: 1 V
Lead Channel Pacing Threshold Pulse Width: 1 ms
Lead Channel Setting Pacing Amplitude: 2.75 V
Lead Channel Setting Pacing Pulse Width: 0.8 ms
Lead Channel Setting Pacing Pulse Width: 1 ms
Lead Channel Setting Sensing Sensitivity: 4 mV
MDC IDC MSMT BATTERY REMAINING LONGEVITY: 20.4 mo
MDC IDC MSMT LEADCHNL LV IMPEDANCE VALUE: 262.5 Ohm
MDC IDC MSMT LEADCHNL LV PACING THRESHOLD AMPLITUDE: 1.75 V
MDC IDC MSMT LEADCHNL RV IMPEDANCE VALUE: 412.5 Ohm
MDC IDC MSMT LEADCHNL RV PACING THRESHOLD PULSEWIDTH: 0.8 ms
MDC IDC MSMT LEADCHNL RV SENSING INTR AMPL: 6.7 mV
MDC IDC PG SERIAL: 2874471
MDC IDC SET LEADCHNL RV PACING AMPLITUDE: 2.5 V

## 2013-03-02 LAB — POCT INR: INR: 4.3

## 2013-03-02 NOTE — Progress Notes (Signed)
CRT-P device check in clinic. Normal device function. Thresholds, sensing, impedance consistent with previous measurements. Histograms appropriate for patient and level of activity. No ventricular high rate episodes. Patient bi-ventricularly pacing >99% of the time. Device programmed with appropriate safety margins. Device heart failure diagnostics are within normal limits and stable over time. Estimated longevity 1.7 years. Patient enrolled in remote follow-up/TTM's with [employer]. Plan to check device remotely in 3 months and every 6 months in office.  Merlin 06/05/13.

## 2013-03-10 ENCOUNTER — Encounter: Payer: Self-pay | Admitting: Internal Medicine

## 2013-03-13 ENCOUNTER — Encounter (INDEPENDENT_AMBULATORY_CARE_PROVIDER_SITE_OTHER): Payer: Self-pay

## 2013-03-13 ENCOUNTER — Ambulatory Visit (INDEPENDENT_AMBULATORY_CARE_PROVIDER_SITE_OTHER): Payer: Medicare Other | Admitting: *Deleted

## 2013-03-13 DIAGNOSIS — Z5181 Encounter for therapeutic drug level monitoring: Secondary | ICD-10-CM

## 2013-03-13 DIAGNOSIS — I4891 Unspecified atrial fibrillation: Secondary | ICD-10-CM

## 2013-03-13 LAB — POCT INR: INR: 3.7

## 2013-03-20 ENCOUNTER — Other Ambulatory Visit: Payer: Self-pay | Admitting: Family Medicine

## 2013-03-27 ENCOUNTER — Other Ambulatory Visit: Payer: Self-pay | Admitting: Family Medicine

## 2013-04-10 ENCOUNTER — Ambulatory Visit: Payer: Medicare Other | Admitting: Family Medicine

## 2013-06-05 ENCOUNTER — Encounter: Payer: Medicare Other | Admitting: *Deleted

## 2013-06-21 ENCOUNTER — Encounter: Payer: Self-pay | Admitting: *Deleted

## 2013-06-22 ENCOUNTER — Telehealth: Payer: Self-pay | Admitting: Internal Medicine

## 2013-06-22 NOTE — Telephone Encounter (Signed)
He is going to increase her Furosemide back to bid.  He says she is retaining water and feels she needs to be on the bid dosing.  He does not know how ii was decreased but feels she needs extra.  He will call if she needs to be seen

## 2013-06-22 NOTE — Telephone Encounter (Signed)
New Message  Pt husband called requests a call back. No further details provided

## 2013-06-30 ENCOUNTER — Ambulatory Visit (INDEPENDENT_AMBULATORY_CARE_PROVIDER_SITE_OTHER): Payer: Medicare Other | Admitting: Family Medicine

## 2013-06-30 ENCOUNTER — Encounter: Payer: Self-pay | Admitting: Family Medicine

## 2013-06-30 VITALS — BP 95/62 | HR 96 | Resp 16 | Wt 204.0 lb

## 2013-06-30 DIAGNOSIS — G894 Chronic pain syndrome: Secondary | ICD-10-CM

## 2013-06-30 DIAGNOSIS — I4891 Unspecified atrial fibrillation: Secondary | ICD-10-CM

## 2013-06-30 DIAGNOSIS — R609 Edema, unspecified: Secondary | ICD-10-CM

## 2013-06-30 DIAGNOSIS — R413 Other amnesia: Secondary | ICD-10-CM

## 2013-06-30 DIAGNOSIS — I10 Essential (primary) hypertension: Secondary | ICD-10-CM

## 2013-06-30 NOTE — Progress Notes (Signed)
Subjective:    Patient ID: [name], female    DOB: 05/01/1932, 80 y.o.   MRN: [id number]  HPI  [name] is here today with her husband [name](George) for medication refills.  [facility]. She was given very clear instructions to bring in her bottles but she did not do so.  She did bring in several lists of medications but she is really not sure what she is taking from the lists.  She has multiple concerns today with her two biggest being fluid in her legs and her bilateral shoulder pain.  She is followed by Dr. Thamas JaegersLennon who was previously with [facility] who is now with [facility].  She was seen in their office sometime in April and received shoulder injections.      Review of Systems  Constitutional: Positive for activity change. Negative for appetite change and fatigue.  Respiratory: Positive for shortness of breath. Negative for chest tightness.   Cardiovascular: Positive for leg swelling. Negative for chest pain and palpitations.  Musculoskeletal: Positive for arthralgias.       Shoulder and Knee Pain   Psychiatric/Behavioral: Positive for sleep disturbance and agitation. Negative for behavioral problems. The patient is not nervous/anxious.   All other systems reviewed and are negative.    Past Medical History  Diagnosis Date  . HTN (hypertension)   . Hernia     Incisional hernia, umbilicus.  . H/O: hysterectomy   . CHB (complete heart block)     s/p pacer (Tenant => GT) => upgrade to BiV pacer  . Atrial fibrillation     on xarleto  . Depression   . Goiter   . DJD (degenerative joint disease)   . GERD (gastroesophageal reflux disease)   . Diverticulosis   . Cardiomyopathy secondary     Myoview 3/13: small inf scar + mild peri-infarct isch; EF 32%;     [name]. CAD (coronary artery disease)     LHC 05/12/11: pLAD 20%, pCFX 20%, mid 30%, m+dRCA with mild lum irregs; EF 35% mid inf wal and inf-apical HK(no culprit for segmental WMA);      [name]. Systolic CHF     Echo 05/2012: Sept dyssynergy from pacing, global HK, inf wall moves less well, mod LVH, EF 45%, diast dysfn, MVP of ant leaflet with mod MR post directed, mild BAE, mod to sev TR, PASP 75  . Pacemaker     upgrade to BiV PPM 02/19/12 (LV lead off)  . H/O hiatal hernia   . LBBB (left bundle branch block)   . Iron deficiency anemia, unspecified 06/07/2012  . Angiodysplasia of stomach 06/09/2012    a. admx with syncope and heme + anemia;  angidysplasia tx with ablation with APC (GI recs ok to resume Xarelto after 5 days)     Past Surgical History  Procedure Laterality Date  . Pacemaker placement  , 2002.  [name]. Cholecystectomy    . Insert / replace / remove pacemaker    . Abdominal hysterectomy    . Tubal ligation    . Small intestine surgery    . Cardiac catheterization    . Epicardial pacing lead placement Left 03/17/2012    Procedure: EPICARDIAL PACING LEAD PLACEMENT;  Surgeon: Alleen BorneBryan K Bartle, MD;  Location: [facility] OR;  Service: Thoracic;  Laterality: Left;  placement of LV epicardial leads  . Esophagogastroduodenoscopy N/A 06/09/2012    Procedure: ESOPHAGOGASTRODUODENOSCOPY (EGD);  Surgeon: Iva Booparl E Gessner, MD;  [facility];  Service: Endoscopy;  Laterality: N/A;  . Colonoscopy N/A 06/09/2012    Procedure: COLONOSCOPY;  Surgeon: Iva Boop, MD;  [facility];  Service: Endoscopy;  Laterality: N/A;  . Hot hemostasis N/A 06/09/2012    Procedure: HOT HEMOSTASIS (ARGON PLASMA COAGULATION/BICAP);  Surgeon: Iva Boop, MD;  Location: [facility];  Service: Endoscopy;  Laterality: N/A;     History   Social History Narrative   Marital Status:  Married Programmer, systems)   Children:  3 (Daughter 1955,  Son 43, Son 1964)   Living Situation: Lives with spouse in a one-level home with 3 steps to enter.  They moved to [facility] from [place] since [name] is from this area.     Occupation: Retired Proofreader)   Education: Associates Degree   Tobacco Use/Exposure: 3 packs per week  for > 50 years; She quit smoking in 2010     Alcohol Use: Occasional   Drug Use:  None   Diet:  Healthy   Exercise:  None   Hobbies:  Crossword Puzzles, Knitting, Reading, Sewing                        Family History  Problem Relation Age of Onset  . Adopted: Yes  . Coronary artery disease    . Other      stomach carcinoma  . Stomach cancer Other   . Coronary artery disease Other   . Cancer Mother      Current Outpatient Prescriptions on File Prior to Visit  Medication Sig Dispense Refill  . atorvastatin (LIPITOR) 40 MG tablet Take 20 mg by mouth every morning. Take 1/2 - 1 tab daily      . carvedilol (COREG) 25 MG tablet Take 1 tablet (25 mg total) by mouth 2 (two) times daily with a meal.  240 tablet  2  . COMBIVENT RESPIMAT 20-100 MCG/ACT AERS respimat Inhale 1 puff into the lungs every 6 (six) hours as needed for wheezing or shortness of breath.       . furosemide (LASIX) 20 MG tablet TAKE THREE TABLETS TWICE DAILY  90 tablet  1  . HYDROcodone-acetaminophen (NORCO/VICODIN) 5-325 MG per tablet Take 1 tablet by mouth as needed.      . irbesartan (AVAPRO) 150 MG tablet Take 150 mg by mouth every morning.      . pantoprazole (PROTONIX) 40 MG tablet Take 1 tablet (40 mg total) by mouth daily.  180 tablet  1  . potassium chloride (K-DUR) 10 MEQ tablet TAKE 4 TABLETS 40MG  IN THE AM AND 2 TABLETS 20MG  IN THE PM  90 tablet  5  . traMADol (ULTRAM) 50 MG tablet Take 1 tablet (50 mg total) by mouth every 6 (six) hours as needed for pain.  120 tablet  5   No current facility-administered medications on file prior to visit.     No Known Allergies   Immunization History  Administered Date(s) Administered  . Influenza,inj,Quad PF,36+ Mos 11/04/2012  . Influenza-Unspecified 11/19/2011  . Pneumococcal Conjugate-13 01/29/2005       Objective:   Physical Exam  Vitals reviewed. Constitutional: She is oriented to person, place, and time. She appears well-developed and  well-nourished.  Cardiovascular: Normal rate and regular rhythm.   Pulmonary/Chest: Effort normal and breath sounds normal.  Musculoskeletal: She exhibits edema and tenderness.  Neurological: She is alert and oriented to person, place, and time.  Skin: Skin is warm and dry.  Psychiatric: Her speech is normal. Thought content normal. Her  affect is labile (She got angry with her husband several times during her encounter.  ). She is agitated. Cognition and memory are impaired. She exhibits abnormal recent memory.      Assessment & Plan:    Margrette was seen today for medication management.  Diagnoses and associated orders for this visit:  Essential hypertension, benign Comments: Karianna's BP is lower than I feel that it needs to be.  I will let her cardiologist decide how low they want it.    Edema Comments: She is to take Lasix 40 mg BID.  I am not sure she is taking Zaroxyln.  She is to bring in her meds next week. I encouraged her to get compression stockings from [facility] [facility] in [place].    Chronic pain syndrome Comments: Dempsey's pain is her number one concern.  Her husband is very unhappy with all of the pain medications she takes and he feels that this is the only thing she is concerned about.  For years, I gave her tramadol.  Her orthopedist gave her Vicodin and once that happened she has been very insistent on wanting this medication. I have told her several times that I was not going to provide her with narcotics and have offered a couple of times to refer her to a pain clinic which she declines.  I called Dr. Rolly Salter office and the earliest that she can get into [facility] July.  She has an appointment with Dr. Thamas Jaegers on July 7th.  She was seen at the orthopedist office in April and received shoulder injections she can contact them to see if they will refill the pain meds they have given her in the past.  I gave her Dr. Lazaro Arms card who may do injections and provide her with pain meds  if she can not get them from [facility].      Memory changes Comments: I have not seen Kaylee since 9/14 and was very surprised about her lack of memory.  We had a long discussion trying to figure out what has happened to her since February.  From what I can piece together, she had a fall in early February and ended up at [facility].  She may have been there for about a month before being sent to [facility].  She was readmitted to [facility] for a CHF exacerbation.  I wonder how much her medications are affecting her (pain meds, sleeping pills and benzodiazepine).  We will go over her meds when she brings them in.    Atrial fibrillation Comments: We also had a long talk about her heart.  She was previously followed by Dr. Lambert Keto at [facility] and then transferred her care to [facility] after he retired. I told her that I felt it makes more sense for her to have a cardiologist at the hospital where she is normally admitted which is [facility].  I called [facility] and she is to be seen by Dr. Sampson Goon on June 15th.  She was on coumadin prior to her fall in February.  I assume that they don't have her on Coumadin any longer because of her fall.  She is on a lot of medications and her pressure is low.  She will discuss this with Dr. Sampson Goon.    TIME SPENT "FACE TO FACE" WITH PATIENT -  60 MINS

## 2013-06-30 NOTE — Patient Instructions (Signed)
1)  Cardiology - Dr. Sampson Goon [facility]) Monday June 15 th 2:30.  2)  Orthopedist - Dr. Thamas Jaegers ([facility]) July 7th at 10am; Call Dr. Lazaro Arms office today and see if he can see you on Tuesday or Wednesday before you see me at 11:45.  BE SURE AND BRING ALL OF YOUR MEDICATIONS TO DR. HUDNALL'S VISIT AND TO MY VISIT.

## 2013-07-04 DIAGNOSIS — I2510 Atherosclerotic heart disease of native coronary artery without angina pectoris: Secondary | ICD-10-CM

## 2013-07-04 DIAGNOSIS — I4891 Unspecified atrial fibrillation: Secondary | ICD-10-CM

## 2013-07-04 DIAGNOSIS — I10 Essential (primary) hypertension: Secondary | ICD-10-CM

## 2013-07-05 ENCOUNTER — Ambulatory Visit (INDEPENDENT_AMBULATORY_CARE_PROVIDER_SITE_OTHER): Payer: Medicare Other | Admitting: Family Medicine

## 2013-07-05 ENCOUNTER — Encounter: Payer: Self-pay | Admitting: Family Medicine

## 2013-07-05 ENCOUNTER — Ambulatory Visit: Payer: Medicare Other | Admitting: Family Medicine

## 2013-07-05 VITALS — BP 125/74 | HR 96 | Resp 16 | Wt 203.0 lb

## 2013-07-05 DIAGNOSIS — E785 Hyperlipidemia, unspecified: Secondary | ICD-10-CM

## 2013-07-05 DIAGNOSIS — E876 Hypokalemia: Secondary | ICD-10-CM

## 2013-07-05 DIAGNOSIS — R609 Edema, unspecified: Secondary | ICD-10-CM

## 2013-07-05 DIAGNOSIS — F3289 Other specified depressive episodes: Secondary | ICD-10-CM

## 2013-07-05 DIAGNOSIS — I10 Essential (primary) hypertension: Secondary | ICD-10-CM

## 2013-07-05 DIAGNOSIS — F329 Major depressive disorder, single episode, unspecified: Secondary | ICD-10-CM

## 2013-07-05 DIAGNOSIS — J4489 Other specified chronic obstructive pulmonary disease: Secondary | ICD-10-CM

## 2013-07-05 DIAGNOSIS — I5022 Chronic systolic (congestive) heart failure: Secondary | ICD-10-CM

## 2013-07-05 DIAGNOSIS — R413 Other amnesia: Secondary | ICD-10-CM

## 2013-07-05 DIAGNOSIS — G894 Chronic pain syndrome: Secondary | ICD-10-CM

## 2013-07-05 DIAGNOSIS — J449 Chronic obstructive pulmonary disease, unspecified: Secondary | ICD-10-CM

## 2013-07-05 MED ORDER — DULOXETINE HCL 60 MG PO CPEP: 60.0000 mg | ORAL_CAPSULE | Freq: Every day | ORAL | Status: AC

## 2013-07-05 MED ORDER — IPRATROPIUM-ALBUTEROL 0.5-2.5 (3) MG/3ML IN SOLN: 3.0000 mL | Freq: Four times a day (QID) | RESPIRATORY_TRACT | Status: AC | PRN

## 2013-07-05 MED ORDER — ATORVASTATIN CALCIUM 20 MG PO TABS: 20.0000 mg | ORAL_TABLET | Freq: Every morning | ORAL | Status: AC

## 2013-07-05 MED ORDER — RIVASTIGMINE 9.5 MG/24HR TD PT24: 9.5000 mg | MEDICATED_PATCH | Freq: Every day | TRANSDERMAL | Status: AC

## 2013-07-05 MED ORDER — IRBESARTAN 150 MG PO TABS: 150.0000 mg | ORAL_TABLET | Freq: Every morning | ORAL | Status: AC

## 2013-07-05 MED ORDER — CARVEDILOL 25 MG PO TABS: 25.0000 mg | ORAL_TABLET | Freq: Two times a day (BID) | ORAL | Status: AC

## 2013-07-05 MED ORDER — POTASSIUM CHLORIDE ER 10 MEQ PO TBCR: EXTENDED_RELEASE_TABLET | ORAL | Status: AC

## 2013-07-05 NOTE — Progress Notes (Signed)
Subjective:    Patient ID: [name], female    DOB: 01/15/1933, 80 y.o.   MRN: [id number]  HPI  [name] is back with her husband to go over her medications. She was seen last week and was very confused about what medications she is taking.  She was instructed to bring them in so we could go over them in detail.  She has completed most of the medications that were sent home from the skilled nursing facility she was sent to after her [facility] admission in February. Her memory seems to be a little better than it was last week.       Review of Systems  Constitutional: Negative for activity change, appetite change, fatigue and unexpected weight change.  HENT: Negative.   Eyes: Negative.   Respiratory: Negative for cough and shortness of breath.   Cardiovascular: Positive for leg swelling (her legs looks much better today as compared to last week). Negative for chest pain and palpitations.  Gastrointestinal: Negative for diarrhea and constipation.  Endocrine: Negative.  Negative for polydipsia, polyphagia and polyuria.  Genitourinary: Negative for difficulty urinating.  Musculoskeletal: Positive for arthralgias (Shoulder, Knee & Back Pain ).  Skin: Negative.   Neurological: Positive for weakness. Negative for dizziness, tremors and headaches.  Hematological: Negative for adenopathy. Does not bruise/bleed easily.  Psychiatric/Behavioral: Positive for confusion (Her memory seems to be improving.  ), sleep disturbance and agitation. Negative for dysphoric mood. The patient is nervous/anxious.   All other systems reviewed and are negative.    Past Medical History  Diagnosis Date  . HTN (hypertension)   . Hernia     Incisional hernia, umbilicus.  . H/O: hysterectomy   . CHB (complete heart block)     s/p pacer (Tenant => GT) => upgrade to BiV pacer  . Atrial fibrillation     on xarleto  . Depression   . Goiter   . DJD (degenerative joint disease)   . GERD (gastroesophageal  reflux disease)   . Diverticulosis   . Cardiomyopathy secondary     Myoview 3/13: small inf scar + mild peri-infarct isch; EF 32%;     [name] CAD (coronary artery disease)     LHC 05/12/11: pLAD 20%, pCFX 20%, mid 30%, m+dRCA with mild lum irregs; EF 35% mid inf wal and inf-apical HK(no culprit for segmental WMA);    [name] Systolic CHF     Echo 05/2012: Sept dyssynergy from pacing, global HK, inf wall moves less well, mod LVH, EF 45%, diast dysfn, MVP of ant leaflet with mod MR post directed, mild BAE, mod to sev TR, PASP 75  . Pacemaker     upgrade to BiV PPM 02/19/12 (LV lead off)  . H/O hiatal hernia   . LBBB (left bundle branch block)   . Iron deficiency anemia, unspecified 06/07/2012  . Angiodysplasia of stomach 06/09/2012    a. admx with syncope and heme + anemia;  angidysplasia tx with ablation with APC (GI recs ok to resume Xarelto after 5 days)  . COPD (chronic obstructive pulmonary disease)      Past Surgical History  Procedure Laterality Date  . Pacemaker placement  , 2002.  [name] Cholecystectomy    . Insert / replace / remove pacemaker    . Abdominal hysterectomy    . Tubal ligation    . Small intestine surgery    . Cardiac catheterization    . Epicardial pacing lead placement Left 03/17/2012    Procedure: EPICARDIAL  PACING LEAD PLACEMENT;  Surgeon: Alleen BorneBryan K Bartle, MD;  Location: [facility] OR;  Service: Thoracic;  Laterality: Left;  placement of LV epicardial leads  . Esophagogastroduodenoscopy N/A 06/09/2012    Procedure: ESOPHAGOGASTRODUODENOSCOPY (EGD);  Surgeon: Iva Booparl E Gessner, MD;  [facility];  Service: Endoscopy;  Laterality: N/A;  . Colonoscopy N/A 06/09/2012    Procedure: COLONOSCOPY;  Surgeon: Iva Booparl E Gessner, MD;  Location: [facility];  Service: Endoscopy;  Laterality: N/A;  . Hot hemostasis N/A 06/09/2012    Procedure: HOT HEMOSTASIS (ARGON PLASMA COAGULATION/BICAP);  Surgeon: Iva Booparl E Gessner, MD;  [facility];  Service: Endoscopy;  Laterality: N/A;     History    Social History Narrative   Marital Status:  Married Programmer, systems(George)   Children:  3 (Daughter 1955,  Son [id number], Son 1964)   Living Situation: Lives with spouse in a one-level home with 3 steps to enter.  [place] since Greggory StallionGeorge is from this area.     Occupation: Retired Proofreader(LPN)   Education: Associates Degree   Tobacco Use/Exposure: 3 packs per week for > 50 years; She quit smoking in 2010     Alcohol Use: Occasional   Drug Use:  None   Diet:  Healthy   Exercise:  None   Hobbies:  Crossword Puzzles, Knitting, Reading, Sewing                        Family History  Problem Relation Age of Onset  . Adopted: Yes  . Coronary artery disease    . Other      stomach carcinoma  . Stomach cancer Other   . Coronary artery disease Other   . Cancer Mother      Current Outpatient Prescriptions on File Prior to Visit  Medication Sig Dispense Refill  . HYDROcodone-acetaminophen (NORCO/VICODIN) 5-325 MG per tablet Take 1 tablet by mouth as needed.      . pantoprazole (PROTONIX) 40 MG tablet Take 1 tablet (40 mg total) by mouth daily.  180 tablet  1  . promethazine (PHENERGAN) 25 MG tablet Take 25 mg by mouth every 6 (six) hours as needed for nausea or vomiting.      . diltiazem (CARDIZEM) 120 MG tablet Take 120 mg by mouth daily.      . nitroGLYCERIN (NITROSTAT) 0.4 MG SL tablet Place 0.4 mg under the tongue every 5 (five) minutes as needed for chest pain.       No current facility-administered medications on file prior to visit.     No Known Allergies   Immunization History  Administered Date(s) Administered  . Influenza,inj,Quad PF,36+ Mos 11/04/2012  . Influenza-Unspecified 11/19/2011  . Pneumococcal Conjugate-13 01/29/2005       Objective:   Physical Exam  Nursing note and vitals reviewed. Constitutional: She is oriented to person, place, and time. She appears well-developed and well-nourished.  Cardiovascular: Normal rate and regular rhythm.    Pulmonary/Chest: Effort normal and breath sounds normal.  Musculoskeletal: She exhibits edema.  Neurological: She is alert and oriented to person, place, and time.  Skin: Skin is warm and dry.  Psychiatric: She has a normal mood and affect.      Assessment & Plan:    Harsimran was seen today for medication management.  Her conditions seem to be controlled.  She was encouraged to schedule an appointment with Dr. Pearletha ForgeHudnall for her shoulder pain to give her some relief until she sees Dr. Thamas JaegersLennon in July.  Diagnoses and associated orders for this visit:  Essential hypertension, benign Comments: She has 4 different medications for BP/CHF.  She has an appointment with Dr. Sampson Goon on June 15th.  I will let them decide which medications they want [place] to be on.   - irbesartan (AVAPRO) 150 MG tablet; Take 1 tablet (150 mg total) by mouth every morning. For BP and CHF - carvedilol (COREG) 25 MG tablet; Take 1 tablet (25 mg total) by mouth 2 (two) times daily with a meal.  Other and unspecified hyperlipidemia - atorvastatin (LIPITOR) 20 MG tablet; Take 1 tablet (20 mg total) by mouth every morning.  Edema Comments: She will take Lasix 40 mg 2-3 times per day.    Chronic systolic heart failure - irbesartan (AVAPRO) 150 MG tablet; Take 1 tablet (150 mg total) by mouth every morning. For BP and CHF - carvedilol (COREG) 25 MG tablet; Take 1 tablet (25 mg total) by mouth 2 (two) times daily with a meal.  Memory difficulty - rivastigmine (EXELON) 9.5 mg/24hr; Place 1 patch (9.5 mg total) onto the skin daily. For memory  Depressive disorder, not elsewhere classified - DULoxetine (CYMBALTA) 60 MG capsule; Take 1 capsule (60 mg total) by mouth daily. For Mood/Pain  Chronic pain syndrome - DULoxetine (CYMBALTA) 60 MG capsule; Take 1 capsule (60 mg total) by mouth daily. For Mood/Pain  Low blood potassium - potassium chloride (K-DUR) 10 MEQ tablet; Take 1 tablet po bid  COPD (chronic obstructive  pulmonary disease) Comments: Her husband [name] feels that she does not sleep well at night due to her breathing.  She was taken for a walk to see if she would qualify for O2.  Her saturation was 97% so she does not qualify.   - ipratropium-albuterol (DUONEB) 0.5-2.5 (3) MG/3ML SOLN; Take 3 mLs by nebulization every 6 (six) hours as needed.  TIME SPENT "FACE TO FACE" WITH PATIENT -  60 MINS

## 2013-07-05 NOTE — Patient Instructions (Signed)
Medications  1)  BP/CHF - Take carvedilol 25 mg 2 x per day + Irbesartan 150 mg daily - The heart doctor will decide which meds he wants you on.    2)  Cholesterol - Atorvastatin 20 mg daily - Take the pieces in the bottle till gone and new RX will be for the 20 mg   3)  Fluid - Lasix 40 mg 2 x per day plus 1 potassium 10 meq 2 x per day   4)  Stomach - Pantoprazole 40 mg daily  5)  Memory - Exelon Patch Daily  6)  Mood/Pain - Cymbalta 60 mg daily   7)  Should/Knee Pain - Call Dr. Pearletha Forge and see him to carry you over until you see Dr. Thamas Jaegers.

## 2013-07-11 ENCOUNTER — Encounter: Payer: Self-pay | Admitting: Family Medicine

## 2013-07-11 ENCOUNTER — Encounter: Payer: Self-pay | Admitting: Cardiology

## 2013-07-11 ENCOUNTER — Ambulatory Visit (INDEPENDENT_AMBULATORY_CARE_PROVIDER_SITE_OTHER): Payer: Medicare Other | Admitting: Family Medicine

## 2013-07-11 VITALS — BP 157/79 | HR 70 | Ht 69.0 in | Wt 200.0 lb

## 2013-07-11 DIAGNOSIS — M25512 Pain in left shoulder: Secondary | ICD-10-CM

## 2013-07-11 DIAGNOSIS — F3289 Other specified depressive episodes: Secondary | ICD-10-CM | POA: Insufficient documentation

## 2013-07-11 DIAGNOSIS — F329 Major depressive disorder, single episode, unspecified: Secondary | ICD-10-CM | POA: Insufficient documentation

## 2013-07-11 DIAGNOSIS — R413 Other amnesia: Secondary | ICD-10-CM | POA: Insufficient documentation

## 2013-07-11 DIAGNOSIS — E876 Hypokalemia: Secondary | ICD-10-CM | POA: Insufficient documentation

## 2013-07-11 DIAGNOSIS — M25511 Pain in right shoulder: Secondary | ICD-10-CM

## 2013-07-11 DIAGNOSIS — J449 Chronic obstructive pulmonary disease, unspecified: Secondary | ICD-10-CM | POA: Insufficient documentation

## 2013-07-11 DIAGNOSIS — M25519 Pain in unspecified shoulder: Secondary | ICD-10-CM

## 2013-07-11 MED ORDER — METHYLPREDNISOLONE ACETATE 40 MG/ML IJ SUSP
40.0000 mg | Freq: Once | INTRAMUSCULAR | Status: AC
Start: 2013-07-11 — End: 2013-07-11
  Administered 2013-07-11: 40 mg via INTRA_ARTICULAR

## 2013-07-11 MED ORDER — METHYLPREDNISOLONE ACETATE 40 MG/ML IJ SUSP
40.0000 mg | Freq: Once | INTRAMUSCULAR | Status: AC
  Administered 2013-07-11: 40 mg via INTRA_ARTICULAR

## 2013-07-11 NOTE — Patient Instructions (Signed)
We repeated your injections today with half in the joint itself. These can be repeated every 3 months if necessary. Glucosamine sulfate 750mg  twice a day is a supplement that may help. Capsaicin topically up to four times a day may also help with pain. Continue with the home therapy you are getting. Heat or ice 15 minutes at a time 3-4 times a day as needed to help with pain. We do not refill medicines like hydrocodone and oxycodone - I'm unsure why your family physician told you we do this. Generally these come from a pain clinic or your family physician (or the physician who prescribed them in the first place if it's someone else). Hopefully the injections will bridge you at least through to your appointment with Dr. Thamas Jaegers.

## 2013-07-13 ENCOUNTER — Encounter: Payer: Self-pay | Admitting: Family Medicine

## 2013-07-13 NOTE — Progress Notes (Signed)
Patient ID: [name], female   DOB: 05/11/1932, 80 y.o.   MRN: [id number]  PCP: Birdena JubileeZANARD, ROBYN, MD  Subjective:   HPI: Patient is a 80 y.o. female here for bilateral shoulder pain.  Patient reports she's had pain in both shoulders, left worse than right, for about 10-15 years. Then she recalls falling 2-3 months ago which worsened her pain. Saw Dr. Thamas JaegersLennon her usual orthopedist and had injections into both shoulders which helped. Unable to get in with him as he has switched from [facility] - appointment with him is over a month away. Has been using heating pad. States Dr. Thamas JaegersLennon initially gave her some pain medicine then she was also prescribed this ([facility] - no longer living there. Difficulty with motion especially left shoulder. Told she has known arthritis in both shoulders but recommended against surgical intervention. Referred here by Dr. Alberteen SamZanard to carry over her care until she has her appointment with Dr. Thamas JaegersLennon. She had patient on tramadol in past.   Past Medical History  Diagnosis Date  . HTN (hypertension)   . Hernia     Incisional hernia, umbilicus.  . H/O: hysterectomy   . CHB (complete heart block)     s/p pacer (Tenant => GT) => upgrade to BiV pacer  . Atrial fibrillation     on xarleto  . Depression   . Goiter   . DJD (degenerative joint disease)   . GERD (gastroesophageal reflux disease)   . Diverticulosis   . Cardiomyopathy secondary     Myoview 3/13: small inf scar + mild peri-infarct isch; EF 32%;     [name]. CAD (coronary artery disease)     LHC 05/12/11: pLAD 20%, pCFX 20%, mid 30%, m+dRCA with mild lum irregs; EF 35% mid inf wal and inf-apical HK(no culprit for segmental WMA);    [name]. Systolic CHF     Echo 05/2012: Sept dyssynergy from pacing, global HK, inf wall moves less well, mod LVH, EF 45%, diast dysfn, MVP of ant leaflet with mod MR post directed, mild BAE, mod to sev TR, PASP 75  . Pacemaker     upgrade to BiV PPM  02/19/12 (LV lead off)  . H/O hiatal hernia   . LBBB (left bundle branch block)   . Iron deficiency anemia, unspecified 06/07/2012  . Angiodysplasia of stomach 06/09/2012    a. admx with syncope and heme + anemia;  angidysplasia tx with ablation with APC (GI recs ok to resume Xarelto after 5 days)  . COPD (chronic obstructive pulmonary disease)     Current Outpatient Prescriptions on File Prior to Visit  Medication Sig Dispense Refill  . atorvastatin (LIPITOR) 20 MG tablet Take 1 tablet (20 mg total) by mouth every morning.  30 tablet  11  . carvedilol (COREG) 25 MG tablet Take 1 tablet (25 mg total) by mouth 2 (two) times daily with a meal.  60 tablet  0  . diltiazem (CARDIZEM) 120 MG tablet Take 120 mg by mouth daily.      . DULoxetine (CYMBALTA) 60 MG capsule Take 1 capsule (60 mg total) by mouth daily. For Mood/Pain  30 capsule  5  . furosemide (LASIX) 40 MG tablet Take 40 mg by mouth 2 (two) times daily.      [name]. HYDROcodone-acetaminophen (NORCO/VICODIN) 5-325 MG per tablet Take 1 tablet by mouth as needed.      [name]. ipratropium-albuterol (DUONEB) 0.5-2.5 (3) MG/3ML SOLN Take 3 mLs by nebulization every 6 (six)  hours as needed.  360 mL  11  . irbesartan (AVAPRO) 150 MG tablet Take 1 tablet (150 mg total) by mouth every morning. For BP and CHF  30 tablet  0  . nitroGLYCERIN (NITROSTAT) 0.4 MG SL tablet Place 0.4 mg under the tongue every 5 (five) minutes as needed for chest pain.      . pantoprazole (PROTONIX) 40 MG tablet Take 1 tablet (40 mg total) by mouth daily.  180 tablet  1  . potassium chloride (K-DUR) 10 MEQ tablet Take 1 tablet po bid  60 tablet  11  . promethazine (PHENERGAN) 25 MG tablet Take 25 mg by mouth every 6 (six) hours as needed for nausea or vomiting.      . rivastigmine (EXELON) 9.5 mg/24hr Place 1 patch (9.5 mg total) onto the skin daily. For memory  30 patch  11  . traZODone (DESYREL) 50 MG tablet Take 50 mg by mouth at bedtime.      [name] zolpidem (AMBIEN) 5 MG tablet Take 5  mg by mouth at bedtime as needed for sleep.       No current facility-administered medications on file prior to visit.    Past Surgical History  Procedure Laterality Date  . Pacemaker placement  , 2002.  [name] Cholecystectomy    . Insert / replace / remove pacemaker    . Abdominal hysterectomy    . Tubal ligation    . Small intestine surgery    . Cardiac catheterization    . Epicardial pacing lead placement Left 03/17/2012    Procedure: EPICARDIAL PACING LEAD PLACEMENT;  Surgeon: [name], MD;  Location: [facility] OR;  Service: Thoracic;  Laterality: Left;  placement of LV epicardial leads  . Esophagogastroduodenoscopy N/A 06/09/2012    Procedure: ESOPHAGOGASTRODUODENOSCOPY (EGD);  Surgeon: Iva Boop, MD;  [facility];  Service: Endoscopy;  Laterality: N/A;  . Colonoscopy N/A 06/09/2012    Procedure: COLONOSCOPY;  Surgeon: Iva Boop, MD;  Location: [facility];  Service: Endoscopy;  Laterality: N/A;  . Hot hemostasis N/A 06/09/2012    Procedure: HOT HEMOSTASIS (ARGON PLASMA COAGULATION/BICAP);  Surgeon: Iva Boop, MD;  [facility];  Service: Endoscopy;  Laterality: N/A;    No Known Allergies  History   Social History  . Marital Status: Married    Spouse Name: [name]    Number of Children: 3  . Years of Education: 14   Occupational History  .     Social History Main Topics  . Smoking status: Former Smoker -- 0.50 packs/day for 50 years    Types: Cigarettes    Quit date: 01/21/2009  . Smokeless tobacco: Former Neurosurgeon    Quit date: 01/21/2009  . Alcohol Use: No  . Drug Use: No  . Sexual Activity: No   Other Topics Concern  . Not on file   Social History Narrative   Marital Status:  Married [name])   Children:  3 (Daughter 1955,  Son 45, Son 1964)   Living Situation: Lives with spouse in a one-level home with 3 steps to enter.  [place] since [name] is from this area.     Occupation: Retired Proofreader)   Education:  Associates Degree   Tobacco Use/Exposure: 3 packs per week for > 50 years; She quit smoking in 2010     Alcohol Use: Occasional   Drug Use:  None   Diet:  Healthy   Exercise:  None   Hobbies:  Crossword Puzzles, Knitting, Reading, Sewing                       Family History  Problem Relation Age of Onset  . Adopted: Yes  . Coronary artery disease    . Other      stomach carcinoma  . Stomach cancer Other   . Coronary artery disease Other   . Cancer Mother     BP 157/79  Pulse 70  Ht 5\' 9"  (1.753 m)  Wt 200 lb (90.719 kg)  BMI 29.52 kg/m2  Review of Systems: See HPI above.    Objective:  Physical Exam:  Gen: NAD  L shoulder: No swelling, ecchymoses.  Atrophy around joint. TTP anterior through posterior shoulder. Abducts only to 50 degrees, painful with grinding.  Flexion to 50 degrees also, ER to 60 degrees. Pain with  Ananias Pilgrim, essentially all motions of shoulder. Strength 5/5 resisted internal/external rotation.  Unable to position for empty can. NV intact distally.    R shoulder: No swelling, ecchymoses.  No gross deformity. No TTP. Flexion, abduction to 140 degrees, ER to 80 degrees. Positive Hawkins, Neers. Negative Speeds, Yergasons. Strength 5/5 with empty can and resisted internal/external rotation. Negative apprehension. NV intact distally.  Assessment & Plan:  1. Bilateral shoulder pain - known DJD, symptomatically worse on left with possible frozen shoulder.  Signs of impingement as well.  Given combination subacromial and glenohumeral injections bilaterally.  Discussed glucosamine, capsaicin, home physical and occupational therapy.  Heat as needed.  They report (and husband was angry when he called for the appointment about this) we were to prescribe her pain medication.  Notes from PCP indicate I may do that but this is not something we do.  Patient will need to discuss with PCP, ortho, or pain clinic (tried to set up by PCP in past).  F/u  with [place] prn.  After informed written consent, patient was seated on exam table. Right shoulder was prepped with alcohol swab and utilizing posterior approach, patient's right shoulder was injected with 6:2 marcaine:depomedrol with half in the subacromial space and half in glenohumeral space.  Patient tolerated the procedure well without immediate complications.  After informed written consent, patient was seated on exam table. Left shoulder was prepped with alcohol swab and utilizing posterior approach, patient's left shoulder was injected with 6:2 marcaine:depomedrol with half in the subacromial space and half in glenohumeral space.  Patient tolerated the procedure well without immediate complications.

## 2013-07-13 NOTE — Assessment & Plan Note (Signed)
known DJD, symptomatically worse on left with possible frozen shoulder.  Signs of impingement as well.  Given combination subacromial and glenohumeral injections bilaterally.  Discussed glucosamine, capsaicin, home physical and occupational therapy.  Heat as needed.  They report (and husband was angry when he called for the appointment about this) we were to prescribe her pain medication.  Notes from PCP indicate I may do that but this is not something we do.  Patient will need to discuss with PCP, ortho, or pain clinic (tried to set up by PCP in past).  F/u with [place] prn.  After informed written consent, patient was seated on exam table. Right shoulder was prepped with alcohol swab and utilizing posterior approach, patient's right shoulder was injected with 6:2 marcaine:depomedrol with half in the subacromial space and half in glenohumeral space.  Patient tolerated the procedure well without immediate complications.  After informed written consent, patient was seated on exam table. Left shoulder was prepped with alcohol swab and utilizing posterior approach, patient's left shoulder was injected with 6:2 marcaine:depomedrol with half in the subacromial space and half in glenohumeral space.  Patient tolerated the procedure well without immediate complications.

## 2013-07-20 ENCOUNTER — Encounter: Payer: Self-pay | Admitting: Family Medicine

## 2013-07-20 ENCOUNTER — Ambulatory Visit (INDEPENDENT_AMBULATORY_CARE_PROVIDER_SITE_OTHER): Payer: Medicare Other | Admitting: Family Medicine

## 2013-07-20 VITALS — BP 145/75 | HR 90 | Resp 16 | Wt 197.0 lb

## 2013-07-20 DIAGNOSIS — I509 Heart failure, unspecified: Secondary | ICD-10-CM

## 2013-07-20 DIAGNOSIS — R413 Other amnesia: Secondary | ICD-10-CM

## 2013-07-20 DIAGNOSIS — R110 Nausea: Secondary | ICD-10-CM

## 2013-07-20 DIAGNOSIS — G4700 Insomnia, unspecified: Secondary | ICD-10-CM

## 2013-07-20 DIAGNOSIS — M25519 Pain in unspecified shoulder: Secondary | ICD-10-CM

## 2013-07-20 DIAGNOSIS — R609 Edema, unspecified: Secondary | ICD-10-CM

## 2013-07-20 MED ORDER — TRAMADOL HCL 50 MG PO TABS: 50.0000 mg | ORAL_TABLET | Freq: Four times a day (QID) | ORAL | Status: AC | PRN

## 2013-07-20 MED ORDER — FUROSEMIDE 40 MG PO TABS: ORAL_TABLET | ORAL | Status: AC

## 2013-07-20 MED ORDER — TRAZODONE HCL 50 MG PO TABS: 50.0000 mg | ORAL_TABLET | Freq: Every day | ORAL | Status: DC

## 2013-07-20 NOTE — Patient Instructions (Addendum)
1)  BP/CHF - Remember to go to your appointment at [facility] (4th Floor) Monday June 15 th with Dr. Sampson GoonFitzgerald at 2:30 pm.  He needs to manage your heart medications and I want him to decide if he is going to put you back on Coumadin or Xarelto.  Explain to Dr. Sampson GoonFitzgerald that your PCP thought it was best to have a cardiologist at the hospital where the ambulance takes you and not one at [facility] since that is not where you usually are admitted.    2)  You have an appointment with Dr. Thamas JaegersLennon on July 7th at 10 am.  You will need to see if they are going to manage your pain or do they want you to go to a pain clinic (info provided).  I was told they could get you in to see them in a month or so.  [facility] can refer if they don't want to manage your pain.  If the cardiologist does not want you to be on a blood thinner then you can see if Dr. Thamas JaegersLennon will add some Celebrex to your drug regimen.    3)  Sleep - We are setting you up for a sleep study and a follow up with Dr. Johnell ComingsMieden.  I have given you a sleeping pill for one month, Dr. Johnell ComingsMieden can decide if you need to remain on this.  I think it is dangerous to be on one if you are on a blood thinning medication.    4)  Medications - Your new bottle of Irbesartan is all mixed up.  [facility] is going to look at it and tell you which pills are the right ones.  Throw out the others and combine the two Irbesartan bottles.                                                                  Insomnia Insomnia is frequent trouble falling and/or staying asleep. Insomnia can be a long term problem or a short term problem. Both are common. Insomnia can be a short term problem when the wakefulness is related to a certain stress or worry. Long term insomnia is often related to ongoing stress during waking hours and/or poor sleeping habits. Overtime, sleep deprivation itself can make the problem worse. Every little thing feels more  severe because you are overtired and your ability to cope is decreased. CAUSES   Stress, anxiety, and depression.  Poor sleeping habits.  Distractions such as TV in the bedroom.  Naps close to bedtime.  Engaging in emotionally charged conversations before bed.  Technical reading before sleep.  Alcohol and other sedatives. They may make the problem worse. They can hurt normal sleep patterns and normal dream activity.  Stimulants such as caffeine for several hours prior to bedtime.  Pain syndromes and shortness of breath can cause insomnia.  Exercise late at night.  Changing time zones may cause sleeping problems (jet lag). It is sometimes helpful to have someone observe your sleeping patterns. They should look for periods of not breathing during the night (sleep apnea). They should also look to see how long those periods last. If you live alone or observers are uncertain, you can also be observed  at a sleep clinic where your sleep patterns will be professionally monitored. Sleep apnea requires a checkup and treatment. Give your caregivers your medical history. Give your caregivers observations your family has made about your sleep.  SYMPTOMS   Not feeling rested in the morning.  Anxiety and restlessness at bedtime.  Difficulty falling and staying asleep. TREATMENT   Your caregiver may prescribe treatment for an underlying medical disorders. Your caregiver can give advice or help if you are using alcohol or other drugs for self-medication. Treatment of underlying problems will usually eliminate insomnia problems.  Medications can be prescribed for short time use. They are generally not recommended for lengthy use.  Over-the-counter sleep medicines are not recommended for lengthy use. They can be habit forming.  You can promote easier sleeping by making lifestyle changes such as:  Using relaxation techniques that help with breathing and reduce muscle tension.  Exercising  earlier in the day.  Changing your diet and the time of your last meal. No night time snacks.  Establish a regular time to go to bed.  Counseling can help with stressful problems and worry.  Soothing music and white noise may be helpful if there are background noises you cannot remove.  Stop tedious detailed work at least one hour before bedtime. HOME CARE INSTRUCTIONS   Keep a diary. Inform your caregiver about your progress. This includes any medication side effects. See your caregiver regularly. Take note of:  Times when you are asleep.  Times when you are awake during the night.  The quality of your sleep.  How you feel the next day. This information will help your caregiver care for you.  Get out of bed if you are still awake after 15 minutes. Read or do some quiet activity. Keep the lights down. Wait until you feel sleepy and go back to bed.  Keep regular sleeping and waking hours. Avoid naps.  Exercise regularly.  Avoid distractions at bedtime. Distractions include watching television or engaging in any intense or detailed activity like attempting to balance the household checkbook.  Develop a bedtime ritual. Keep a familiar routine of bathing, brushing your teeth, climbing into bed at the same time each night, listening to soothing music. Routines increase the success of falling to sleep faster.  Use relaxation techniques. This can be using breathing and muscle tension release routines. It can also include visualizing peaceful scenes. You can also help control troubling or intruding thoughts by keeping your mind occupied with boring or repetitive thoughts like the old concept of counting sheep. You can make it more creative like imagining planting one beautiful flower after another in your backyard garden.  During your day, work to eliminate stress. When this is not possible use some of the previous suggestions to help reduce the anxiety that accompanies stressful  situations. MAKE SURE YOU:   Understand these instructions.  Will watch your condition.  Will get help right away if you are not doing well or get worse. Document Released: 01/24/2000 Document Revised: 04/20/2011 Document Reviewed: 02/23/2007 [facility] Patient Information 2014 [place], [place].

## 2013-07-20 NOTE — Progress Notes (Signed)
Subjective:    Patient ID: [name], female    DOB: Jun 14, 1932, 80 y.o.   MRN: [id number]  HPI  [name] is back complaining of what she describes as severe pain in both of her shoulders and in her upper back.  She was referred to Dr. Pearletha Forge for injections and pain management until she can get back in with her orthopedist Dr. Thamas Jaegers who is now with [facility].  Dr. Pearletha Forge did injections which apparently gave her minimal relief and he did not prescribe her anything for pain so she is back in our office wanting something to be done for her until her appointment with Dr. Thamas Jaegers in July.  She says that her pain is worse at night and as a result she is unable to get any rest.  She is now out of the Vicodin and the sleeping pills that were being given to her by [facility] and has called our office numerous times per day for several days wanting something to be done for her since her recent  injections did little to relieve her pain.  Another complaint she has today is nausea.  Her husband also remains worried about her breathing at night.  He feels that her oxygen level drops when she is is trying to sleep which is also keeping her from getting "restful" sleep.     Review of Systems  Constitutional: Positive for activity change and fatigue. Negative for appetite change.  Cardiovascular: Positive for leg swelling. Negative for chest pain and palpitations.  Musculoskeletal: Positive for back pain and myalgias.       Pain in both shoulders  Psychiatric/Behavioral: Positive for sleep disturbance.  All other systems reviewed and are negative.    Past Medical History  Diagnosis Date  . HTN (hypertension)   . Hernia     Incisional hernia, umbilicus.  . H/O: hysterectomy   . CHB (complete heart block)     s/p pacer (Tenant => GT) => upgrade to BiV pacer  . Atrial fibrillation     on xarleto  . Depression   . Goiter   . DJD (degenerative joint disease)   . GERD (gastroesophageal  reflux disease)   . Diverticulosis   . Cardiomyopathy secondary     Myoview 3/13: small inf scar + mild peri-infarct isch; EF 32%;     [name] CAD (coronary artery disease)     LHC 05/12/11: pLAD 20%, pCFX 20%, mid 30%, m+dRCA with mild lum irregs; EF 35% mid inf wal and inf-apical HK(no culprit for segmental WMA);    [name] Systolic CHF     Echo 05/2012: Sept dyssynergy from pacing, global HK, inf wall moves less well, mod LVH, EF 45%, diast dysfn, MVP of ant leaflet with mod MR post directed, mild BAE, mod to sev TR, PASP 75  . Pacemaker     upgrade to BiV PPM 02/19/12 (LV lead off)  . H/O hiatal hernia   . LBBB (left bundle branch block)   . Iron deficiency anemia, unspecified 06/07/2012  . Angiodysplasia of stomach 06/09/2012    a. admx with syncope and heme + anemia;  angidysplasia tx with ablation with APC (GI recs ok to resume Xarelto after 5 days)  . COPD (chronic obstructive pulmonary disease)      Past Surgical History  Procedure Laterality Date  . Pacemaker placement  , 2002.  [name] Cholecystectomy    . Insert / replace / remove pacemaker    . Abdominal hysterectomy    .  Tubal ligation    . Small intestine surgery    . Cardiac catheterization    . Epicardial pacing lead placement Left 03/17/2012    Procedure: EPICARDIAL PACING LEAD PLACEMENT;  Surgeon: [name], MD;  Location: [facility] OR;  Service: Thoracic;  Laterality: Left;  placement of LV epicardial leads  . Esophagogastroduodenoscopy N/A 06/09/2012    Procedure: ESOPHAGOGASTRODUODENOSCOPY (EGD);  Surgeon: Iva Boop, MD;  Location: [facility];  Service: Endoscopy;  Laterality: N/A;  . Colonoscopy N/A 06/09/2012    Procedure: COLONOSCOPY;  Surgeon: Iva Boop, MD;  Location: [facility];  Service: Endoscopy;  Laterality: N/A;  . Hot hemostasis N/A 06/09/2012    Procedure: HOT HEMOSTASIS (ARGON PLASMA COAGULATION/BICAP);  Surgeon: Iva Boop, MD;  Location: [facility];  Service: Endoscopy;  Laterality: N/A;     History    Social History Narrative   Marital Status:  Married Programmer, systems)   Children:  3 (Daughter 1955,  Son 23, Son 1964)   Living Situation: Lives with spouse in a one-level home with 3 steps to enter.  They moved to [facility] [place] [facility] from [place] since [name] is from this area.     Occupation: Retired Proofreader)   Education: Associates Degree   Tobacco Use/Exposure: 3 packs per week for > 50 years; She quit smoking in 2010     Alcohol Use: Occasional   Drug Use:  None   Diet:  Healthy   Exercise:  None   Hobbies:  Crossword Puzzles, Knitting, Reading, Sewing                        Family History  Problem Relation Age of Onset  . Adopted: Yes  . Coronary artery disease    . Other      stomach carcinoma  . Stomach cancer Other   . Coronary artery disease Other   . Cancer Mother      Current Outpatient Prescriptions on File Prior to Visit  Medication Sig Dispense Refill  . atorvastatin (LIPITOR) 20 MG tablet Take 1 tablet (20 mg total) by mouth every morning.  30 tablet  11  . carvedilol (COREG) 25 MG tablet Take 1 tablet (25 mg total) by mouth 2 (two) times daily with a meal.  60 tablet  0  . DULoxetine (CYMBALTA) 60 MG capsule Take 1 capsule (60 mg total) by mouth daily. For Mood/Pain  30 capsule  5  . furosemide (LASIX) 40 MG tablet Take 40 mg by mouth 2 (two) times daily.      [name] ipratropium-albuterol (DUONEB) 0.5-2.5 (3) MG/3ML SOLN Take 3 mLs by nebulization every 6 (six) hours as needed.  360 mL  11  . irbesartan (AVAPRO) 150 MG tablet Take 1 tablet (150 mg total) by mouth every morning. For BP and CHF  30 tablet  0  . nitroGLYCERIN (NITROSTAT) 0.4 MG SL tablet Place 0.4 mg under the tongue every 5 (five) minutes as needed for chest pain.      . pantoprazole (PROTONIX) 40 MG tablet Take 1 tablet (40 mg total) by mouth daily.  180 tablet  1  . potassium chloride (K-DUR) 10 MEQ tablet Take 1 tablet po bid  60 tablet  11  . rivastigmine (EXELON) 9.5 mg/24hr Place 1 patch (9.5 mg total) onto the  skin daily. For memory  30 patch  11  . diltiazem (CARDIZEM) 120 MG tablet Take 120 mg by mouth daily.      [name] HYDROcodone-acetaminophen (NORCO/VICODIN) 5-325  MG per tablet Take 1 tablet by mouth as needed.      . promethazine (PHENERGAN) 25 MG tablet Take 25 mg by mouth every 6 (six) hours as needed for nausea or vomiting.       No current facility-administered medications on file prior to visit.     No Known Allergies   Immunization History  Administered Date(s) Administered  . Influenza,inj,Quad PF,36+ Mos 11/04/2012  . Influenza-Unspecified 11/19/2011  . Pneumococcal-Unspecified 01/29/2005  . Zoster 02/09/2010       Objective:   Physical Exam  Nursing note and vitals reviewed. Constitutional: She appears well-nourished.  Cardiovascular: Normal rate.   Pulmonary/Chest: No respiratory distress. She has no wheezes.  Abdominal: There is no tenderness.  Musculoskeletal: She exhibits edema and tenderness.  Skin: Skin is dry.  Psychiatric: She has a normal mood and affect. Her behavior is normal. Judgment and thought content normal.      Assessment & Plan:    [name] was seen today for shoulder pain and sleeping trouble.  Diagnoses and associated orders for this visit:  Pain in joint, shoulder region Comments: I referred [name] to [name] hoping that he could manage her pain for a month until she was able to get back to [name].  Unfortunately, this did not happen so she was given a refill for tramadol for one month.  We had another long discussion about her pain medications.  She will discuss her pain management with [name] who can continue her Vicodin if they choose to do since she prefers this over tramadol or they can refer her to the [facility] office for pain management.  I called and spoke with them and was told that they would see her mid/late July.   [name] would prefer to go to [name] so she will discuss this further with them.    - traMADol (ULTRAM) 50 MG tablet; Take 1 tablet (50 mg total) by mouth every 6 (six) hours as needed for moderate pain.  Insomnia Comments: We also had a long discussion about her being on medications causing sedation.  According to [facility], she was on Trazodone, Ambien as well as Ativan.  I explained to her that first and foremost she was on too many pills causing sedation and that my fear for her being on these medications is that she could fall and break a hip or worse.  This would be especially worrisome if she goes back on a blood thinner. We are setting her up with [name] for a sleep study and he can decide what to do about her taking a medication for sleep.  I gave her one month of trazodone to cover her until she sees [name].   - traZODone (DESYREL) 50 MG tablet; Take 1 tablet (50 mg total) by mouth at bedtime.  Nausea alone Comments: I explained to her that her nausea may be withdrawal symptoms from her coming off of Vicodin. She has had Phenergan in the past.  She did not bring this in and may have some at home. She thinks that she will be okay and would prefer to not have another medication to take.    Edema Comments: She was given a refill for her Lasix but that prescription seems to have been lost so one was sent in to [facility].   - furosemide (LASIX) 40 MG tablet; Take 1 tablet up to 3 times per day for  fluid  CHF (congestive heart failure) Comments: She was reminded that she has her appointment with [name] on Monday with [name].  She was instructed to bring in all medication bottles with her.  We are sending her back to [name] (she was previously treated by Dr. Lambert Keto) because she lives in [facility] and this is where she goes when she is taken to the hospital by ambulance.  She was previously on Coumadin and it appears that it was stopped after she had a fall in February which resulted in a hospital  admission which eventually led to her stay at [facility].  I want a cardiologist to decide if she needs to be on a blood thinner and also to manage her cardiac/BP medications.  Her list from [name] had her on carvedilol, irbesartan, diltiazem and Lasix.  She was on amlodipine at her last office visit with me back in September.  For now, she is to take the carvedilol and irbesartan.  [name] can decide what he wants her to be on and I will let him know that I want his office to prescribe all medications related to her BP/Heart.      Memory changes Comments: Apryle's mental status was greatly improved at today's visit which I feel is a result of her coming off of so many medications she was placed on at [facility].  [name] can also tell that she is much better.      TIME SPENT "FACE TO FACE" WITH PATIENT -  30 MINS

## 2013-07-24 ENCOUNTER — Encounter: Payer: Self-pay | Admitting: Family Medicine

## 2013-07-24 DIAGNOSIS — M549 Dorsalgia, unspecified: Secondary | ICD-10-CM | POA: Insufficient documentation

## 2013-07-24 DIAGNOSIS — R110 Nausea: Secondary | ICD-10-CM | POA: Insufficient documentation

## 2013-07-25 ENCOUNTER — Encounter: Payer: Self-pay | Admitting: Cardiology

## 2013-08-15 ENCOUNTER — Other Ambulatory Visit: Payer: Self-pay | Admitting: Family Medicine

## 2013-08-17 ENCOUNTER — Other Ambulatory Visit: Payer: Self-pay | Admitting: Family Medicine

## 2013-09-01 ENCOUNTER — Other Ambulatory Visit: Payer: Self-pay | Admitting: *Deleted

## 2013-09-01 DIAGNOSIS — G4700 Insomnia, unspecified: Secondary | ICD-10-CM

## 2013-09-01 MED ORDER — TRAZODONE HCL 50 MG PO TABS: 50.0000 mg | ORAL_TABLET | Freq: Every day | ORAL | Status: AC

## 2013-12-06 ENCOUNTER — Encounter: Payer: Self-pay | Admitting: *Deleted

## 2013-12-20 ENCOUNTER — Encounter: Payer: Self-pay | Admitting: Internal Medicine

## 2014-01-18 ENCOUNTER — Encounter (HOSPITAL_COMMUNITY): Payer: Self-pay | Admitting: Cardiovascular Disease

## 2014-02-06 ENCOUNTER — Encounter: Payer: Self-pay | Admitting: *Deleted

## 2014-02-22 ENCOUNTER — Encounter (HOSPITAL_COMMUNITY): Payer: Self-pay | Admitting: Cardiology

## 2014-03-09 ENCOUNTER — Encounter: Payer: Self-pay | Admitting: *Deleted

## 2014-04-05 ENCOUNTER — Encounter: Payer: Self-pay | Admitting: *Deleted

## 2014-04-20 ENCOUNTER — Encounter: Payer: Self-pay | Admitting: *Deleted

## 2014-05-10 ENCOUNTER — Encounter: Payer: Self-pay | Admitting: *Deleted

## 2014-05-15 ENCOUNTER — Ambulatory Visit: Payer: Self-pay | Admitting: Internal Medicine

## 2014-05-15 DIAGNOSIS — I4891 Unspecified atrial fibrillation: Secondary | ICD-10-CM

## 2014-05-15 DIAGNOSIS — Z5181 Encounter for therapeutic drug level monitoring: Secondary | ICD-10-CM

## 2014-11-22 IMAGING — CR DG CHEST 2V
2 series · 2 of 2 positions shown · non-contrast
Comparison: 02/20/2012

CLINICAL DATA: Abdominal/right shoulder pain

CHEST - 2 VIEW

[w chest pa]
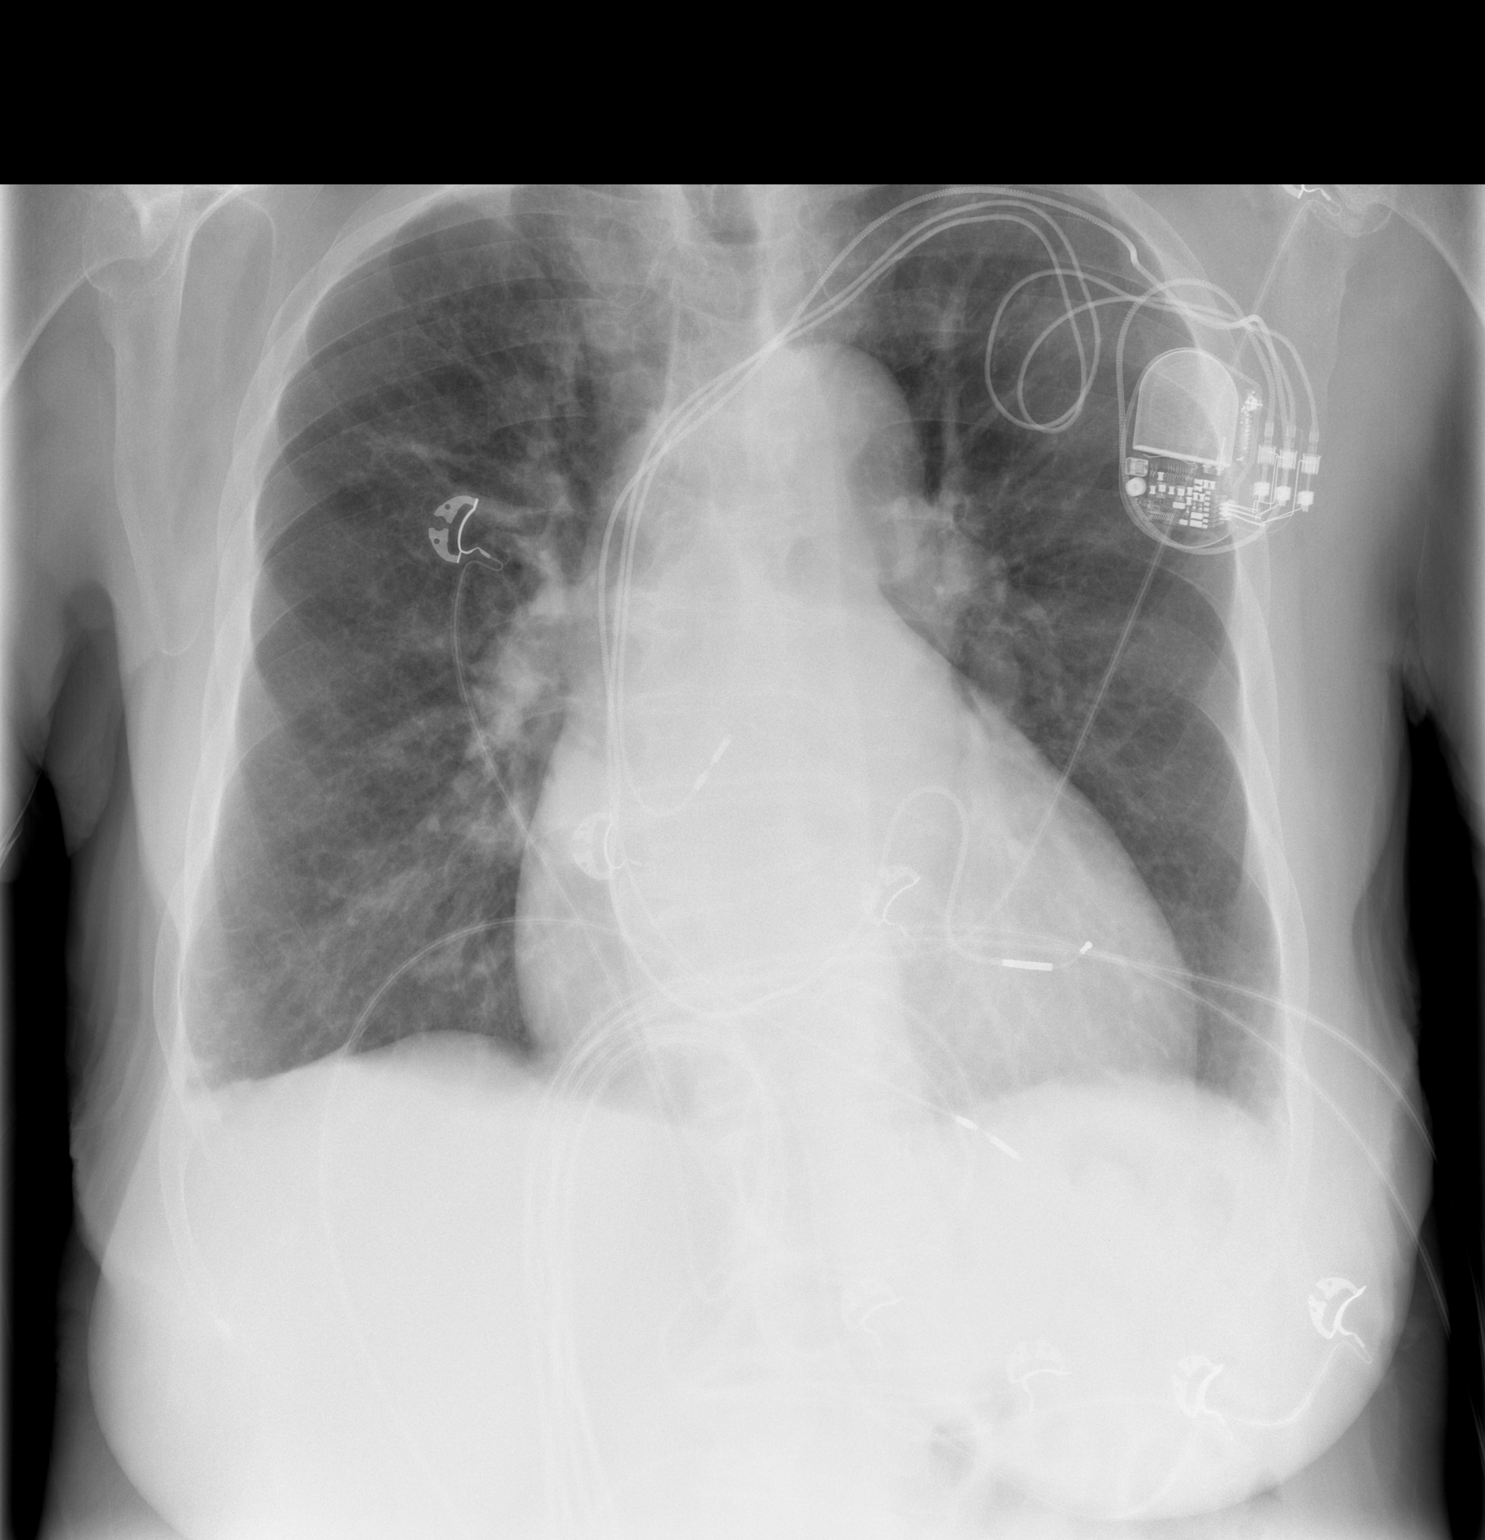

[w chest lat]
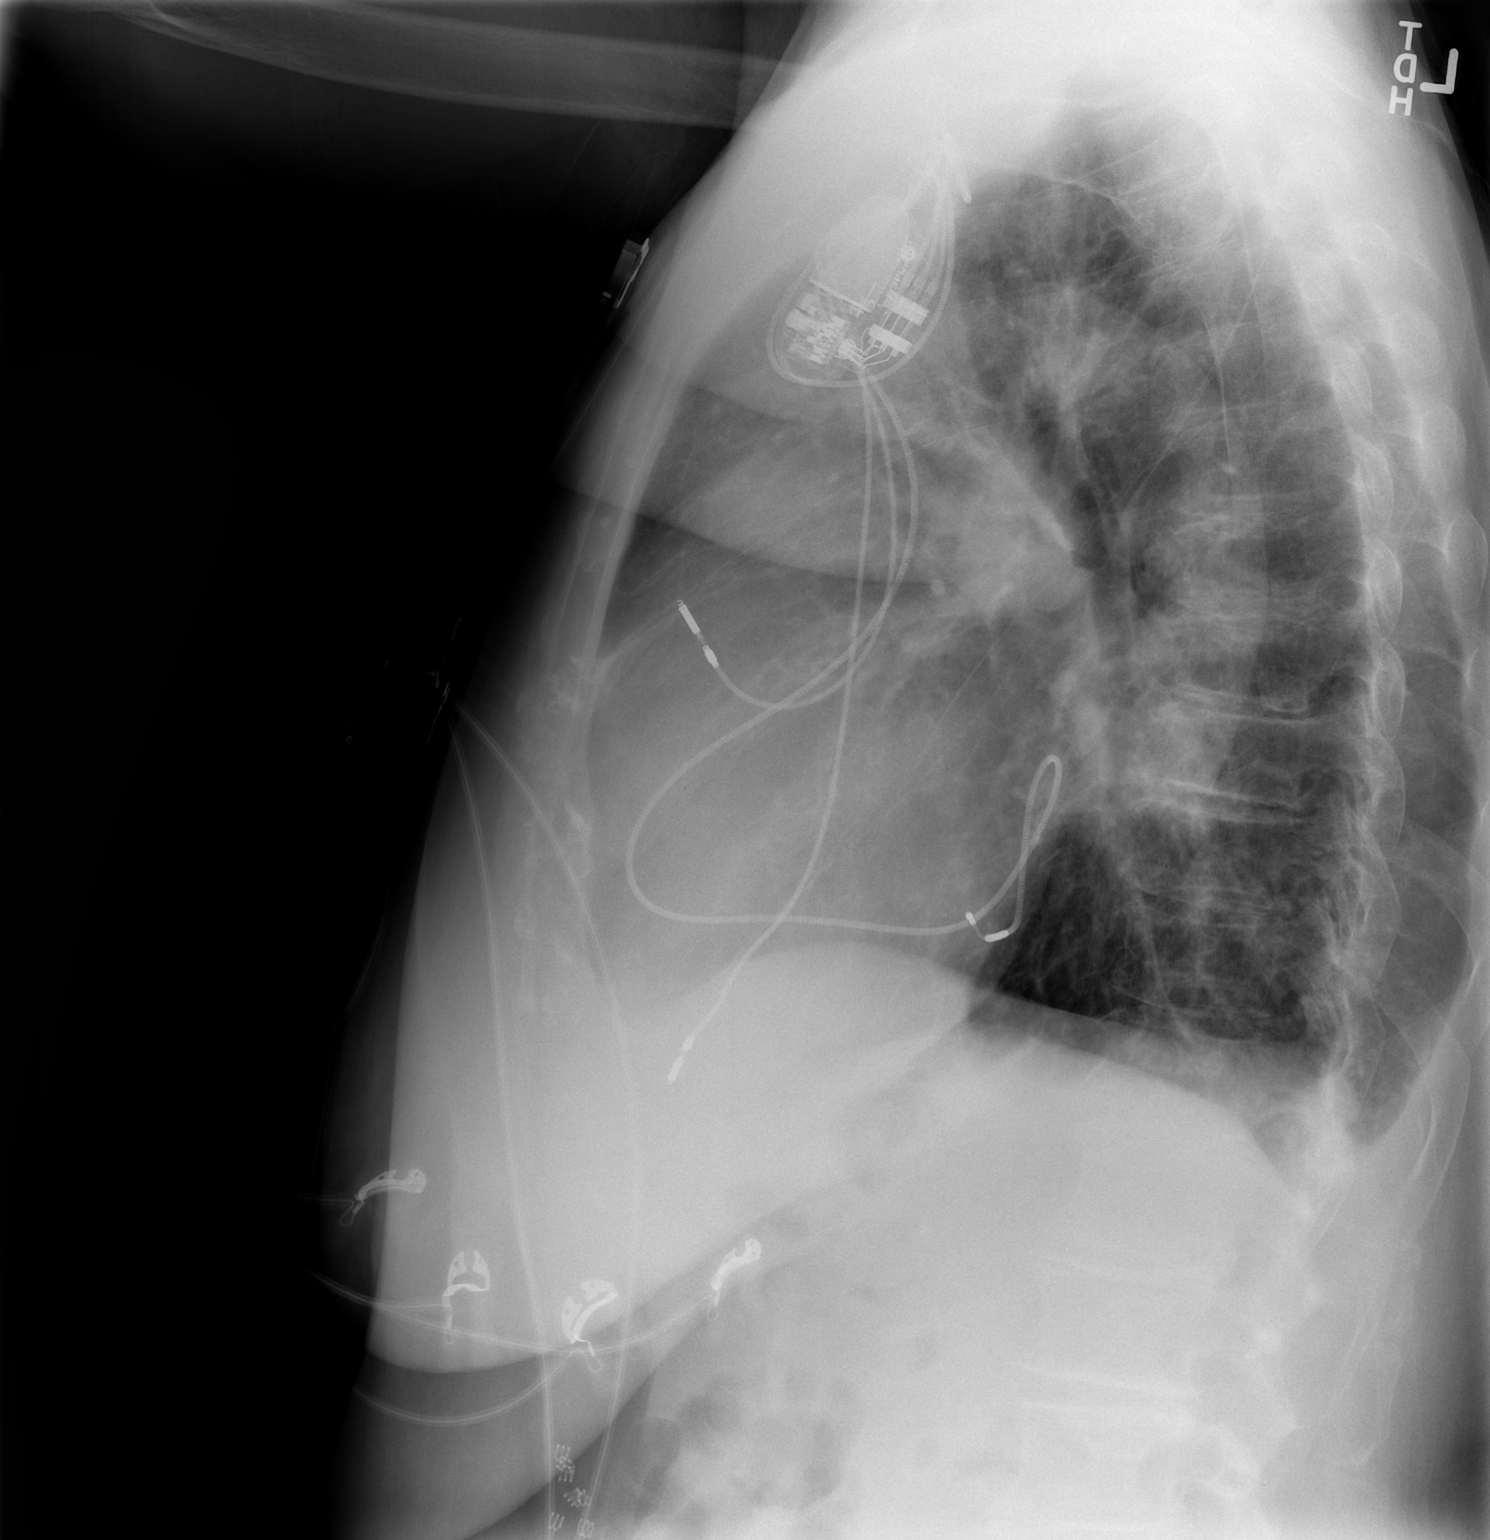

[2 of 2 positions shown; findings below may reference images not displayed]

FINDINGS: Lungs are essentially clear.  No focal consolidation. No
pleural effusion or pneumothorax.

Moderate cardiomegaly.  Left subclavian pacemaker.

Mild degenerative changes of the visualized thoracolumbar spine.
IMPRESSION: No evidence of acute cardiopulmonary disease.

## 2015-01-01 IMAGING — US US THORACENTESIS ASP PLEURAL SPACE W/IMG GUIDE
1 series · 4 of 4 positions shown · non-contrast
Comparison: None

CLINICAL DATA: Left pleural effusion

ULTRASOUND GUIDED LEFT THORACENTESIS

[Series 1: us thoracentesis asp pleural space w/img guide · 0.35mm/px · 4 of 4 slices shown]
[im 1/4]
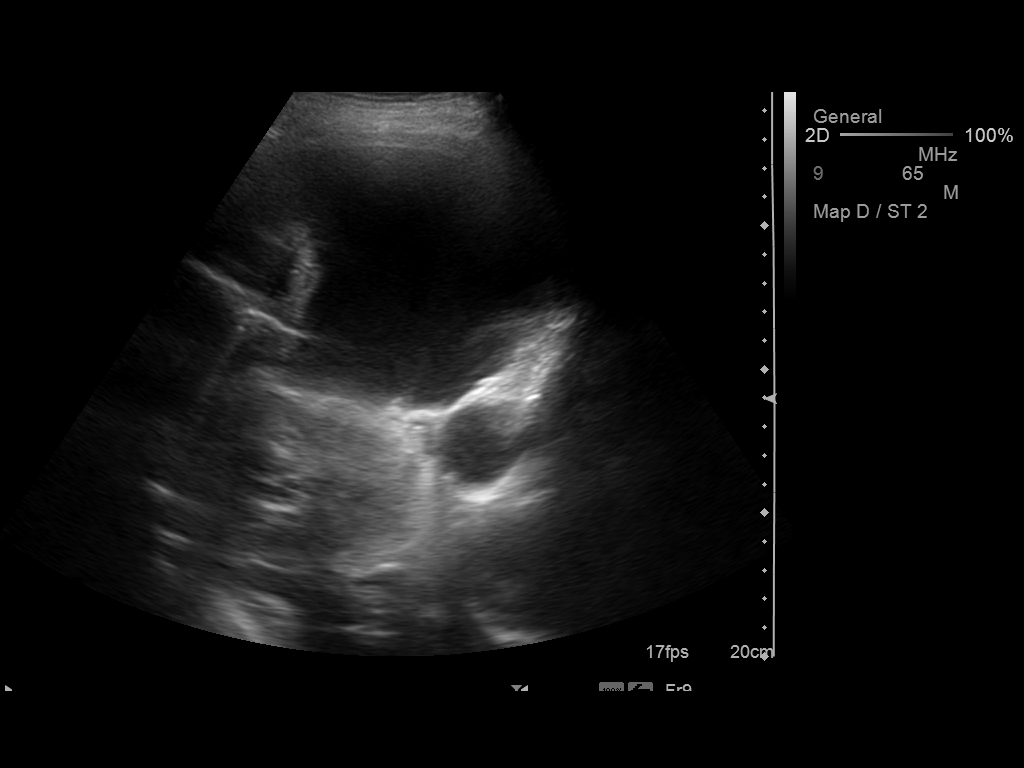
[im 2/4]
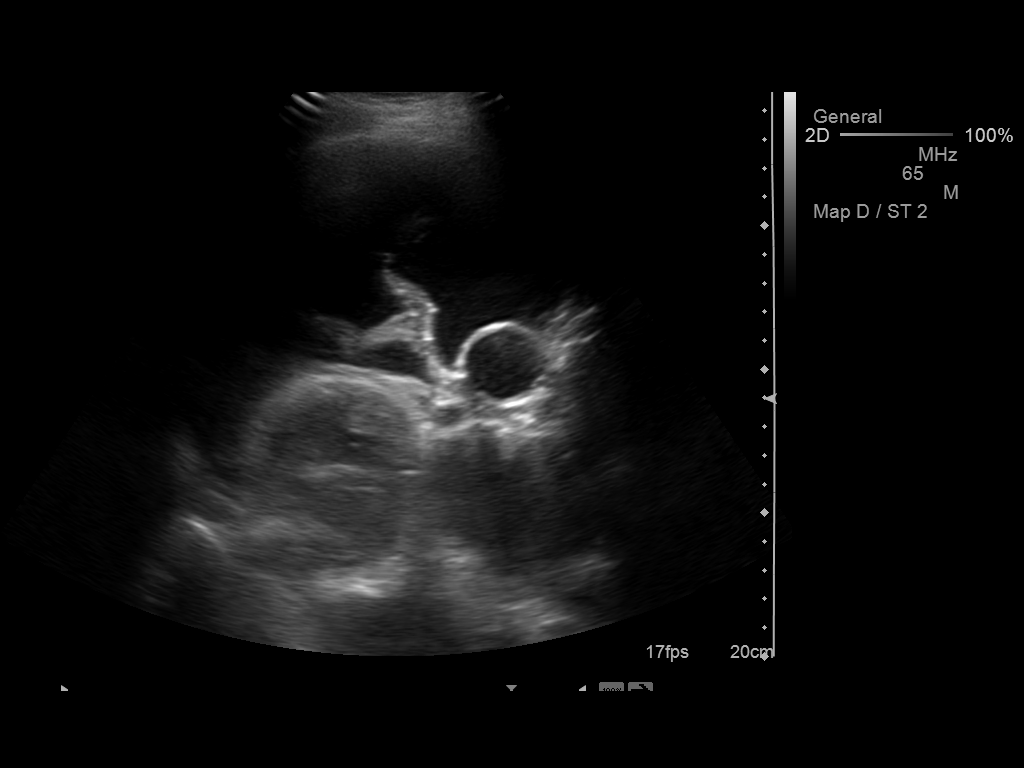
[im 3/4]
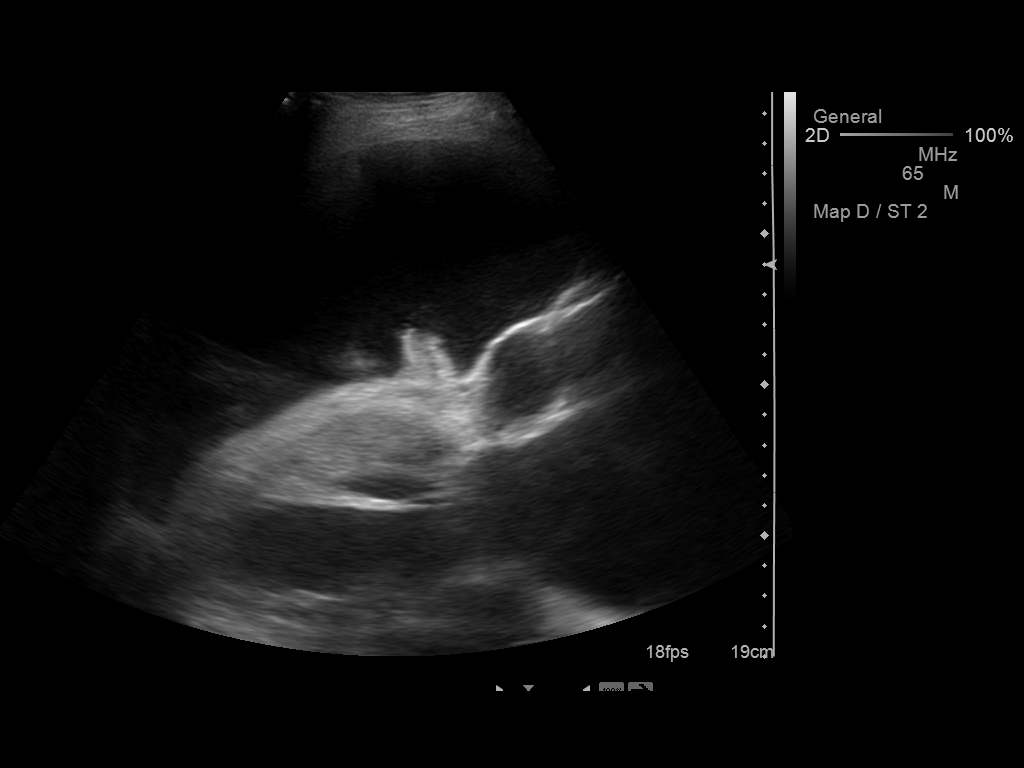
[im 4/4]
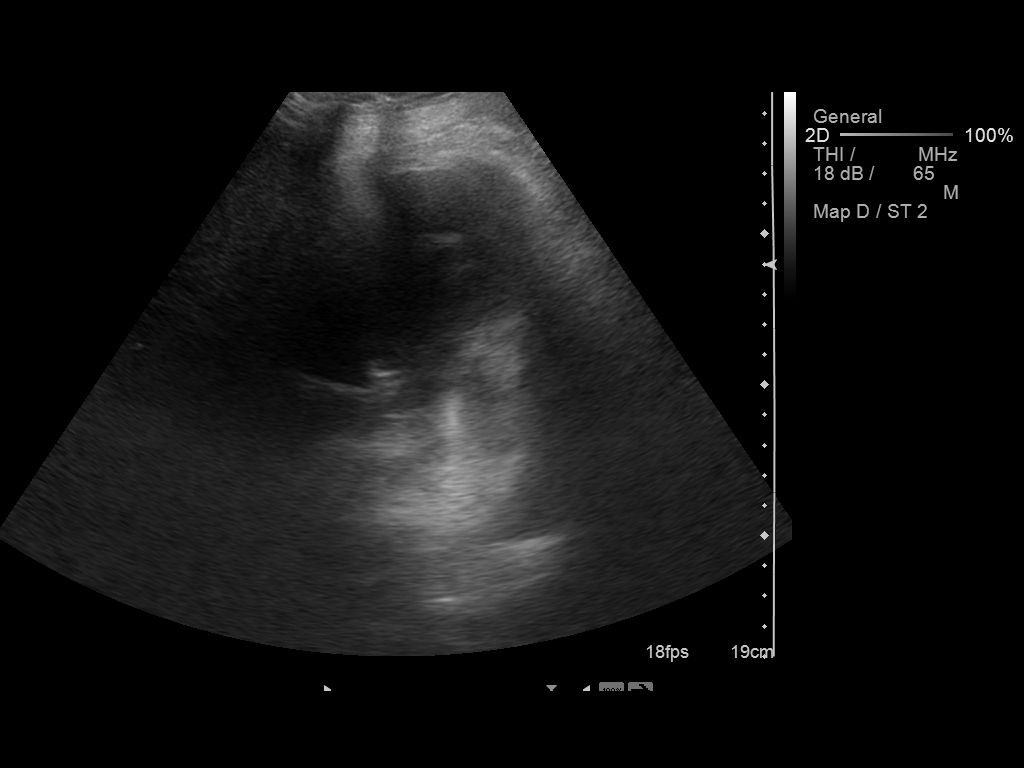

[4 of 4 positions shown; findings below may reference images not displayed]

An ultrasound guided thoracentesis was thoroughly discussed with
the patient and questions answered.  The benefits, risks,
alternatives and complications were also discussed.  The patient
understands and wishes to proceed with the procedure.  Written
consent was obtained.

Ultrasound was performed to localize and mark an adequate pocket of
fluid in the left chest.  The area was then prepped and draped in
the normal sterile fashion.  1% Lidocaine was used for local
anesthesia.  Under ultrasound guidance a 19 gauge Yueh catheter was
introduced.  Thoracentesis was performed.  The catheter was removed
and a dressing applied.

Complications:  None
FINDINGS: A total of approximately 400 ml of bloody fluid was
removed. A fluid sample was not sent for laboratory analysis.
IMPRESSION: Successful ultrasound guided left thoracentesis yielding 400 ml of
pleural fluid.

Read by: Marekkel, Csakity.-HARON

## 2015-01-14 IMAGING — CR DG CHEST 2V
2 series · 2 of 2 positions shown · non-contrast
Comparison: 04/21/2012

CLINICAL DATA: Post heart surgery.

CHEST - 2 VIEW

[view not recorded (1 of 2)]
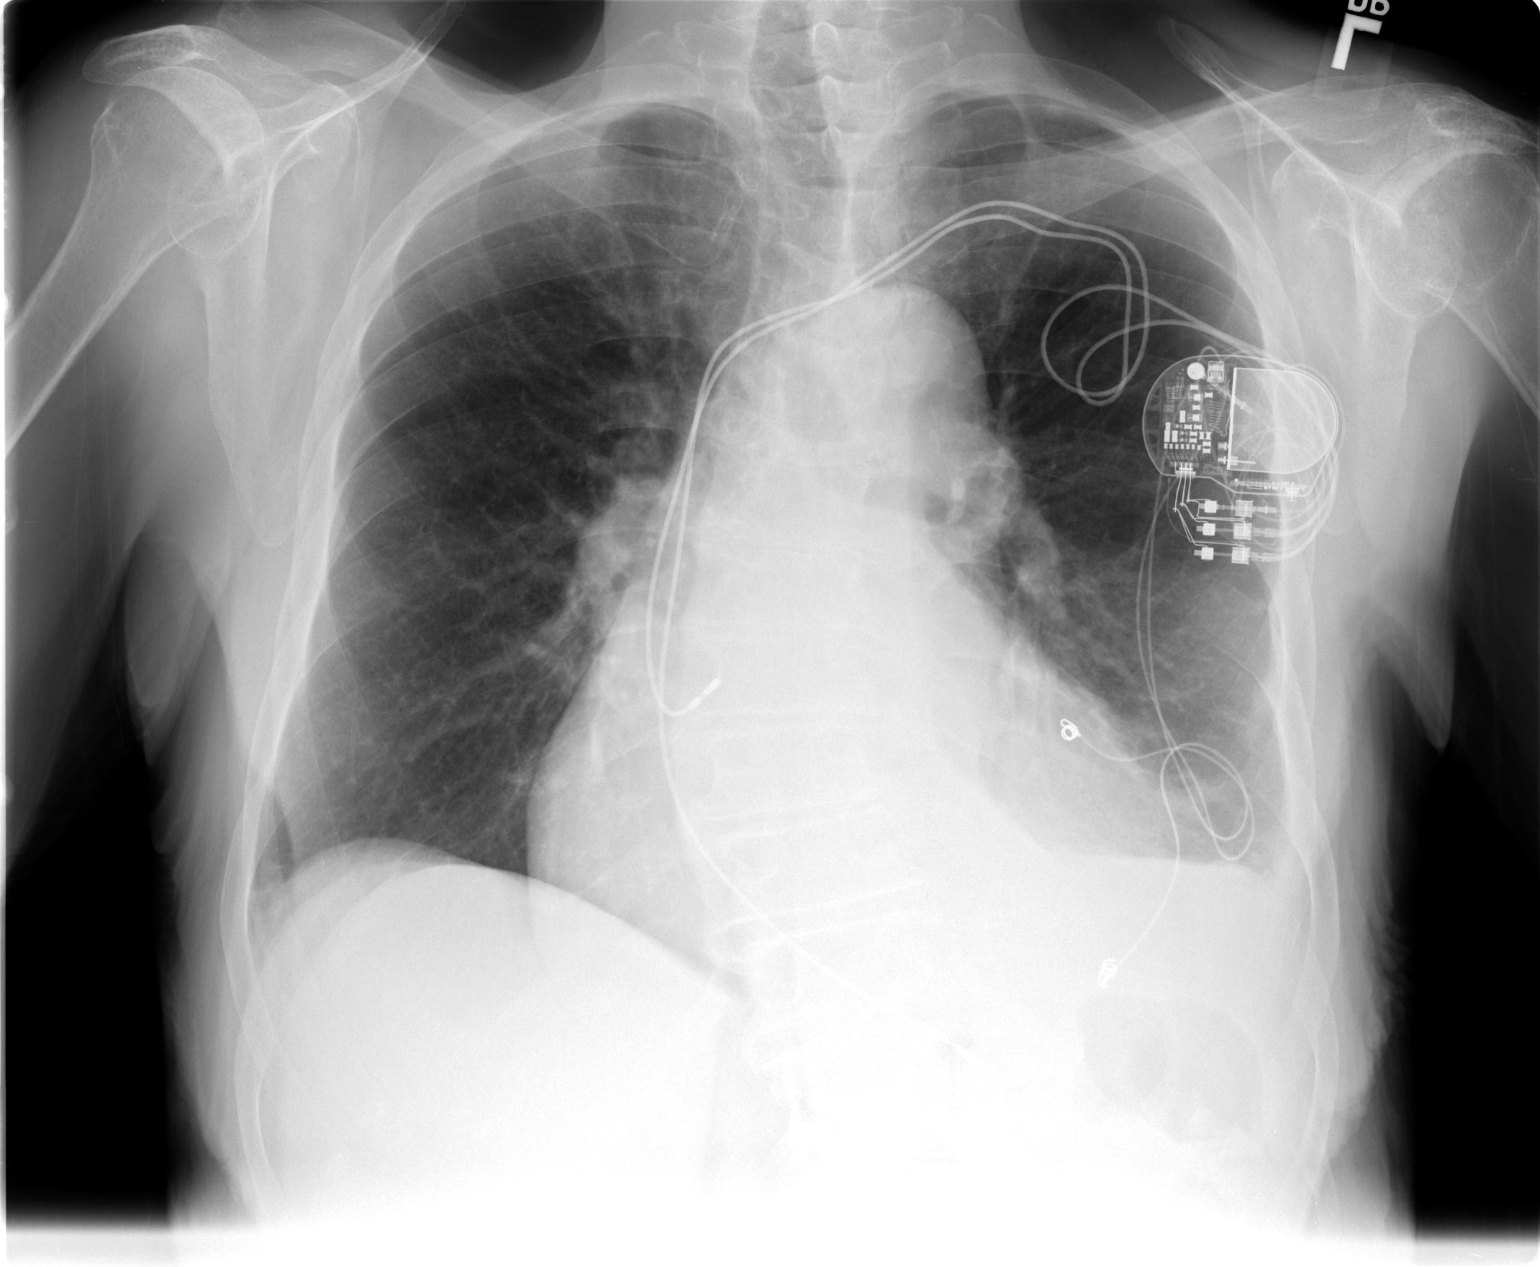

[view not recorded (2 of 2)]
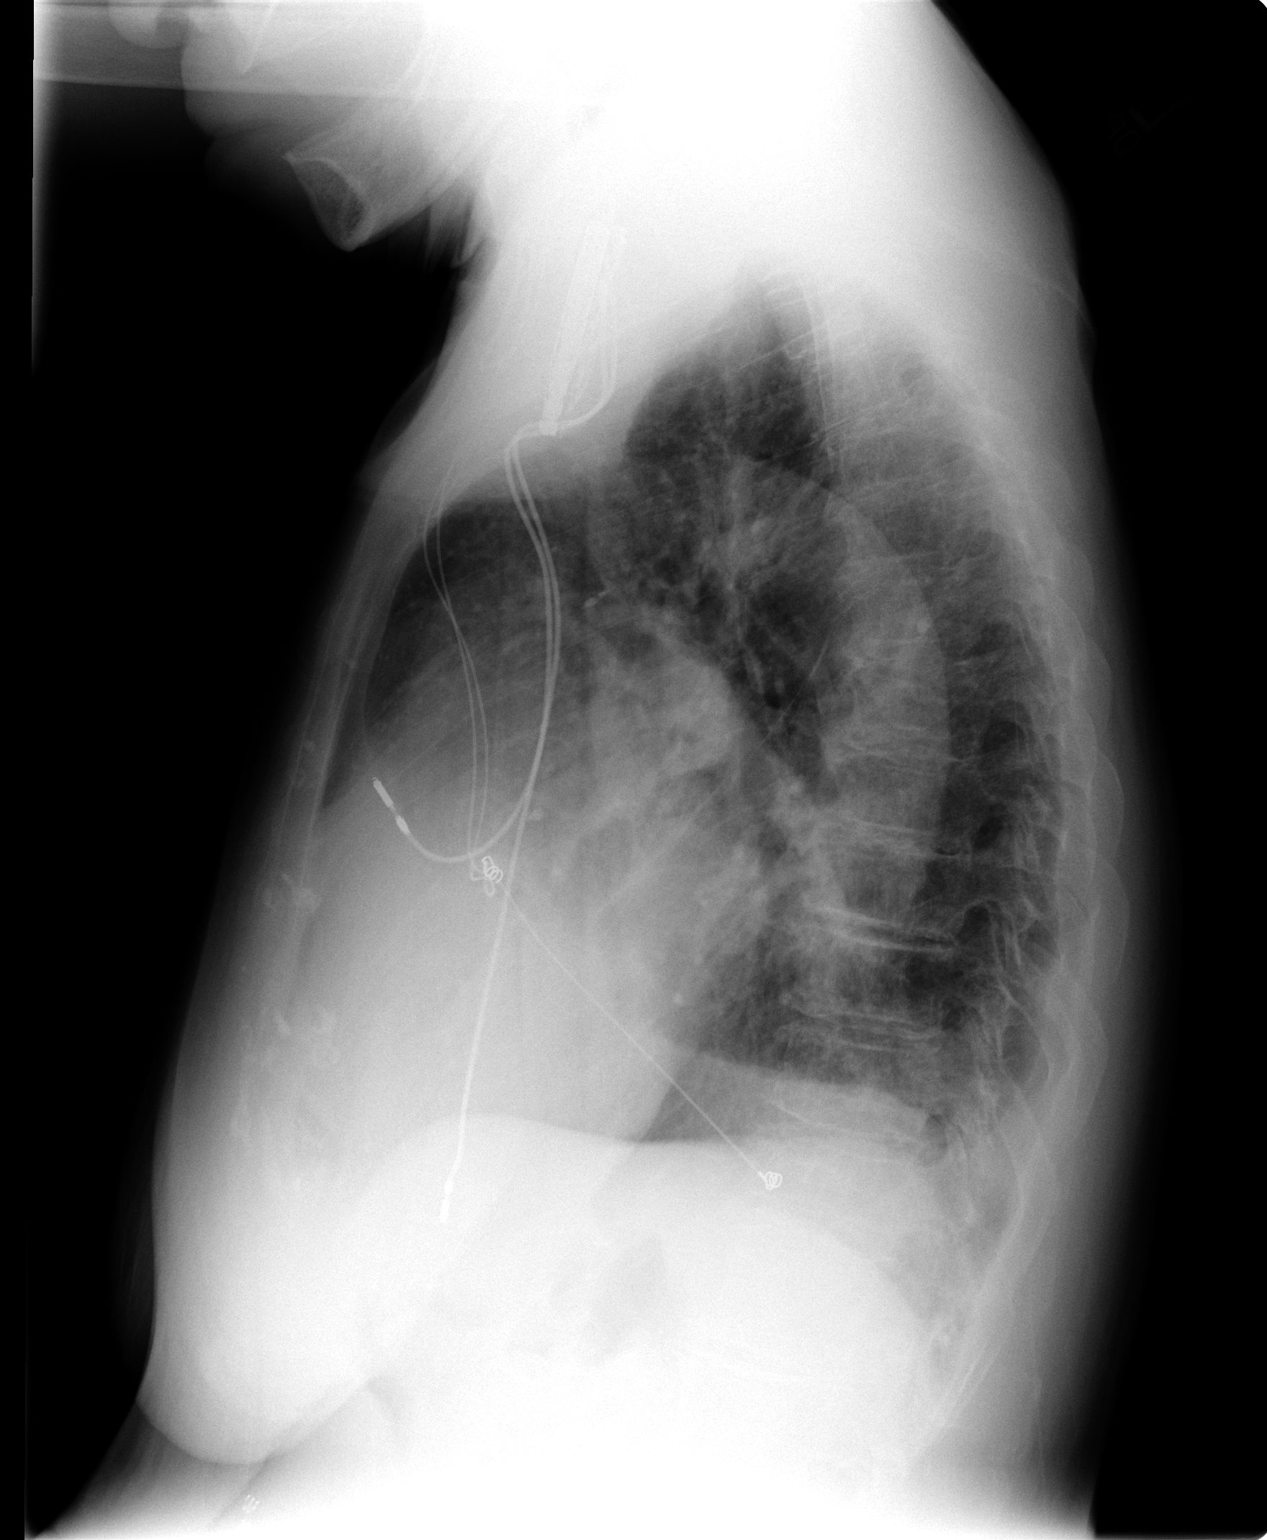

[2 of 2 positions shown; findings below may reference images not displayed]

FINDINGS: Left pacer remains in place, unchanged.  Cardiomegaly.
Small to moderate left pleural effusion.  Left lower lobe
atelectasis.  No pneumothorax.  Right lung is clear.
IMPRESSION: Continued small to moderate left pleural effusion with left lower
lobe atelectasis.  Stable cardiomegaly.

## 2015-02-13 ENCOUNTER — Ambulatory Visit (HOSPITAL_COMMUNITY): Payer: Medicare Other | Attending: Cardiology

## 2015-02-13 ENCOUNTER — Other Ambulatory Visit (HOSPITAL_COMMUNITY): Payer: Self-pay | Admitting: Nurse Practitioner

## 2015-02-13 ENCOUNTER — Other Ambulatory Visit: Payer: Self-pay

## 2015-02-13 DIAGNOSIS — I340 Nonrheumatic mitral (valve) insufficiency: Secondary | ICD-10-CM | POA: Insufficient documentation

## 2015-02-13 DIAGNOSIS — I5022 Chronic systolic (congestive) heart failure: Secondary | ICD-10-CM

## 2015-02-13 DIAGNOSIS — I341 Nonrheumatic mitral (valve) prolapse: Secondary | ICD-10-CM | POA: Insufficient documentation

## 2015-02-13 DIAGNOSIS — I059 Rheumatic mitral valve disease, unspecified: Secondary | ICD-10-CM | POA: Insufficient documentation

## 2015-02-13 DIAGNOSIS — I517 Cardiomegaly: Secondary | ICD-10-CM | POA: Insufficient documentation

## 2015-02-13 DIAGNOSIS — I10 Essential (primary) hypertension: Secondary | ICD-10-CM | POA: Insufficient documentation

## 2015-02-13 DIAGNOSIS — I071 Rheumatic tricuspid insufficiency: Secondary | ICD-10-CM | POA: Diagnosis not present

## 2017-09-09 DEATH — deceased
